# Patient Record
Sex: Male | Born: 1945 | Race: White | Hispanic: No | Marital: Married
Health system: Southern US, Community
[De-identification: ages and names within clinical notes are randomized; demographics above are authoritative.]

## PROBLEM LIST (undated history)

## (undated) DIAGNOSIS — I10 Essential (primary) hypertension: Secondary | ICD-10-CM

## (undated) DIAGNOSIS — E7800 Pure hypercholesterolemia, unspecified: Secondary | ICD-10-CM

## (undated) DIAGNOSIS — J342 Deviated nasal septum: Secondary | ICD-10-CM

## (undated) DIAGNOSIS — R42 Dizziness and giddiness: Secondary | ICD-10-CM

## (undated) DIAGNOSIS — G4730 Sleep apnea, unspecified: Secondary | ICD-10-CM

## (undated) DIAGNOSIS — J343 Hypertrophy of nasal turbinates: Secondary | ICD-10-CM

## (undated) DIAGNOSIS — M1990 Unspecified osteoarthritis, unspecified site: Secondary | ICD-10-CM

## (undated) DIAGNOSIS — R413 Other amnesia: Secondary | ICD-10-CM

## (undated) DIAGNOSIS — E860 Dehydration: Secondary | ICD-10-CM

## (undated) DIAGNOSIS — I2510 Atherosclerotic heart disease of native coronary artery without angina pectoris: Secondary | ICD-10-CM

## (undated) HISTORY — PX: EXCISION MORTON'S NEUROMA: SHX5013

## (undated) HISTORY — PX: CORONARY ARTERY BYPASS GRAFT: SHX141

## (undated) HISTORY — PX: CARDIAC SURGERY: SHX584

## (undated) HISTORY — PX: HERNIA REPAIR: SHX51

## (undated) HISTORY — DX: Dehydration: E86.0

## (undated) HISTORY — DX: Dizziness and giddiness: R42

## (undated) HISTORY — PX: OTHER SURGICAL HISTORY: SHX169

---

## 2001-08-03 ENCOUNTER — Ambulatory Visit (HOSPITAL_BASED_OUTPATIENT_CLINIC_OR_DEPARTMENT_OTHER): Admission: RE | Admit: 2001-08-03 | Discharge: 2001-08-03 | Payer: Self-pay | Admitting: Surgery

## 2004-08-09 ENCOUNTER — Emergency Department (HOSPITAL_COMMUNITY): Admission: AD | Admit: 2004-08-09 | Discharge: 2004-08-09 | Payer: Self-pay | Admitting: Family Medicine

## 2009-12-03 ENCOUNTER — Observation Stay (HOSPITAL_COMMUNITY): Admission: EM | Admit: 2009-12-03 | Discharge: 2009-12-03 | Payer: Self-pay | Admitting: Emergency Medicine

## 2010-10-06 LAB — BASIC METABOLIC PANEL
BUN: 13 mg/dL (ref 6–23)
Calcium: 8.3 mg/dL — ABNORMAL LOW (ref 8.4–10.5)
Chloride: 107 mEq/L (ref 96–112)
Creatinine, Ser: 1.06 mg/dL (ref 0.4–1.5)
GFR calc Af Amer: >60 mL/min (ref >60)
GFR calc non Af Amer: >60 mL/min (ref >60)
GFR calc non Af Amer: >60 mL/min (ref >60)
Glucose, Bld: 111 mg/dL — ABNORMAL HIGH (ref 70–99)
Potassium: 4 mEq/L (ref 3.5–5.1)

## 2010-10-06 LAB — CROSSMATCH
ABO/RH(D): O NEG
Antibody Screen: NEGATIVE

## 2010-10-06 LAB — APTT: aPTT: 25 seconds (ref 24–37)

## 2010-10-06 LAB — CBC
HCT: 40.8 % (ref 39.0–52.0)
MCHC: 33.4 g/dL (ref 30.0–36.0)
MCV: 93.1 fL (ref 78.0–100.0)
Platelets: 198 10*3/uL (ref 150–400)
Platelets: 298 10*3/uL (ref 150–400)
RBC: 3.75 MIL/uL — ABNORMAL LOW (ref 4.22–5.81)
RDW: 12.9 % (ref 11.5–15.5)
WBC: 11.4 10*3/uL — ABNORMAL HIGH (ref 4.0–10.5)

## 2010-10-06 LAB — DIFFERENTIAL
Basophils Absolute: 0 10*3/uL (ref 0.0–0.1)
Eosinophils Absolute: 0.1 10*3/uL (ref 0.0–0.7)
Eosinophils Relative: 1 % (ref 0–5)
Lymphocytes Relative: 21 % (ref 12–46)

## 2010-10-06 LAB — PROTIME-INR
INR: 1.26 (ref 0.00–1.49)
Prothrombin Time: 15.7 seconds — ABNORMAL HIGH (ref 11.6–15.2)

## 2010-10-06 LAB — URINALYSIS, ROUTINE W REFLEX MICROSCOPIC
Bilirubin Urine: NEGATIVE
Ketones, ur: NEGATIVE mg/dL
Nitrite: NEGATIVE
Protein, ur: NEGATIVE mg/dL
Urobilinogen, UA: 0.2 mg/dL (ref 0.0–1.0)

## 2010-12-05 NOTE — Op Note (Signed)
[place]  Patient:    Omar Kennedy, Omar Kennedy Visit Number: [id number] MRN: [id number]          Service Type: DSU Location: [facility] Attending Physician:  Charlton Haws Dictated by:   Currie Paris, M.D. Proc. Date: 08/03/01 Admit Date:  08/03/2001   CC:         Melvyn Neth D. Clovis Riley, M.D.   Operative Report  VISIT:  [id number].  OFFICE MEDICAL RECORD NUMBER:  [id number].  PREOPERATIVE DIAGNOSIS:  Left inguinal hernia.  POSTOPERATIVE DIAGNOSIS:  Left inguinal hernia - direct.  OPERATION:  Repair of direct left inguinal hernia with mesh.  SURGEON:  Currie Paris, M.D.  ANESTHESIA:  MAC.  CLINICAL HISTORY:  This patient is a 65 year old who had a left inguinal hernia and desired to have this fixed.  DESCRIPTION OF PROCEDURE:  The patient was seen in the holding area, and the operative site was identified and marked.  He was taken to the operating room and given IV sedation.  The groin area on the left side was shaved, prepped, and draped.  A combination of 1% Xylocaine with epinephrine plus 0.5% plain Marcaine was mixed equally and used for local.  I infiltrated along the incision line and then below the fascia at the anterior-superior iliac spine.  The skin incision was made and deepened to the external oblique aponeurosis with bleeders either coagulated or tied with 4-0 Vicryl.  Additional local was infiltrated as the deeper layers were encountered.  The external oblique was opened in the line of its fibers.  Retractors were placed.  The cord was dissected off the floor and surrounded with a Penrose drain.  The ilioinguinal nerve was kept with that, and the iliohypogastric nerve identified and preserved.  This was a large direct defect with some fatty tissue protruding through, but no indirect sac noted.  The direct material was cleaned off the posterior aspect of the cord, free up nicely, and reduced easily.  I used a large  mesh plug to occlude the defect and then used some 2-0 Prolenes to hold this in place and to somewhat reapproximate the transversalis.  I then placed a mesh patch on top and sutured it in with a running suture inferiorly, tacked it medially, and let it go well lateral past the cord using the split to make a new deep ring.  I tacked the two tails of the mesh together laterally to reconstruct the deep ring.  Everything appeared to be dry.  There was no tension, and the cord appeared to have plenty of room to come through.  The external oblique was closed with a running 3-0 Vicryl, Scarpas with running 3-0 Vicryl, and the skin with a 4-0 Monocryl subcuticular running.  The patient tolerated the procedure well, and there were no operative complications.  All counts are correct. Dictated by:   Currie Paris, M.D. Attending Physician:  Charlton Haws DD:  08/03/01 TD:  08/03/01 Job: [id number]

## 2011-05-20 DIAGNOSIS — G4700 Insomnia, unspecified: Secondary | ICD-10-CM | POA: Insufficient documentation

## 2011-05-20 DIAGNOSIS — N400 Enlarged prostate without lower urinary tract symptoms: Secondary | ICD-10-CM | POA: Insufficient documentation

## 2015-08-05 ENCOUNTER — Other Ambulatory Visit: Payer: Self-pay | Admitting: Family Medicine

## 2015-08-05 DIAGNOSIS — Z8709 Personal history of other diseases of the respiratory system: Secondary | ICD-10-CM

## 2015-08-07 ENCOUNTER — Other Ambulatory Visit: Payer: Self-pay

## 2015-12-10 DIAGNOSIS — E7800 Pure hypercholesterolemia, unspecified: Secondary | ICD-10-CM

## 2015-12-10 DIAGNOSIS — R030 Elevated blood-pressure reading, without diagnosis of hypertension: Secondary | ICD-10-CM | POA: Insufficient documentation

## 2015-12-10 DIAGNOSIS — Z87891 Personal history of nicotine dependence: Secondary | ICD-10-CM | POA: Insufficient documentation

## 2015-12-10 DIAGNOSIS — I10 Essential (primary) hypertension: Secondary | ICD-10-CM | POA: Insufficient documentation

## 2015-12-10 DIAGNOSIS — J339 Nasal polyp, unspecified: Secondary | ICD-10-CM | POA: Insufficient documentation

## 2015-12-10 DIAGNOSIS — R413 Other amnesia: Secondary | ICD-10-CM | POA: Insufficient documentation

## 2015-12-10 DIAGNOSIS — G5762 Lesion of plantar nerve, left lower limb: Secondary | ICD-10-CM | POA: Insufficient documentation

## 2015-12-10 HISTORY — DX: Lesion of plantar nerve, left lower limb: G57.62

## 2015-12-10 HISTORY — DX: Pure hypercholesterolemia, unspecified: E78.00

## 2016-07-16 ENCOUNTER — Other Ambulatory Visit (INDEPENDENT_AMBULATORY_CARE_PROVIDER_SITE_OTHER): Payer: Self-pay | Admitting: Otolaryngology

## 2016-07-16 DIAGNOSIS — J329 Chronic sinusitis, unspecified: Secondary | ICD-10-CM

## 2016-07-17 ENCOUNTER — Ambulatory Visit
Admission: RE | Admit: 2016-07-17 | Discharge: 2016-07-17 | Disposition: A | Discharge status: Home / Self Care | Payer: Medicare Other | Source: Ambulatory Visit | Attending: Otolaryngology | Admitting: Otolaryngology

## 2016-07-17 DIAGNOSIS — J329 Chronic sinusitis, unspecified: Secondary | ICD-10-CM

## 2016-07-20 HISTORY — PX: CORONARY ARTERY BYPASS GRAFT: SHX141

## 2016-12-28 ENCOUNTER — Other Ambulatory Visit: Payer: Self-pay | Admitting: Family Medicine

## 2016-12-28 DIAGNOSIS — R634 Abnormal weight loss: Secondary | ICD-10-CM

## 2017-05-03 ENCOUNTER — Ambulatory Visit (INDEPENDENT_AMBULATORY_CARE_PROVIDER_SITE_OTHER): Payer: Medicare Other

## 2017-05-03 ENCOUNTER — Ambulatory Visit (INDEPENDENT_AMBULATORY_CARE_PROVIDER_SITE_OTHER): Payer: Medicare Other | Admitting: Podiatry

## 2017-05-03 ENCOUNTER — Encounter: Payer: Self-pay | Admitting: Podiatry

## 2017-05-03 VITALS — BP 163/80 | HR 64 | Resp 16

## 2017-05-03 DIAGNOSIS — M2042 Other hammer toe(s) (acquired), left foot: Secondary | ICD-10-CM

## 2017-05-03 DIAGNOSIS — M21619 Bunion of unspecified foot: Secondary | ICD-10-CM

## 2017-05-03 DIAGNOSIS — Q828 Other specified congenital malformations of skin: Secondary | ICD-10-CM

## 2017-05-03 NOTE — Progress Notes (Signed)
   Subjective:    Patient ID: Omar Kennedy, male    DOB: 16-Jul-1946, 71 y.o.   MRN: [id number]  HPI 71 year old male presents the office today for concerns of painful lesion to the bottom of his left foot which is been ongoing for about 1 year and is painful with pressure in shoes. He also states he has bunions and hammertoes on his left foot he was discussed although they are not actually causing any pain. He previously has seen other doctors for this he recommended surgery but he does not want to proceed with this. He denies stepping on any foreign objects. Denies any redness or swelling or any drainage. He's had no other treatments. He has no other complaints today.   Review of Systems  All other systems reviewed and are negative.  History reviewed. No pertinent past medical history.  History reviewed. No pertinent surgical history.   Current Outpatient Prescriptions:  .  fluticasone (FLONASE) 50 MCG/ACT nasal spray, Place 2 sprays into both nostrils daily., Disp: , Rfl: 11  Allergies  Allergen Reactions  . Tetracycline Other (See Comments)    VERTIGO  . Amoxicillin-Pot Clavulanate Nausea Only    Social History   Social History  . Marital status: Single    Spouse name: N/A  . Number of children: N/A  . Years of education: N/A   Occupational History  . Not on file.   Social History Main Topics  . Smoking status: Unknown If Ever Smoked  . Smokeless tobacco: Never Used  . Alcohol use Not on file  . Drug use: Unknown  . Sexual activity: Not on file   Other Topics Concern  . Not on file   Social History Narrative  . No narrative on file         Objective:   Physical Exam General: AAO x3, NAD  Dermatological: There is an annular, deep, hyperkeratotic lesion present submetatarsal 3 on the left foot. Upon debridement there is no underlying ulceration, drainage or any signs of infection. There are no other open lesions or pre-ulcerative lesion idenified at this  time.  Vascular: Dorsalis Pedis artery and Posterior Tibial artery pedal pulses are 2/4 bilateral with immedate capillary fill time.No varicosities and no lower extremity edema present bilateral. There is no pain with calf compression, swelling, warmth, erythema.   Neruologic: Grossly intact via light touch bilateral. Protective threshold with Semmes Wienstein monofilament intact to all pedal sites bilateral.   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 His DIPJ contracture of the second toe at PIPJ contracture of the third digit. There is no tenderness along his digital deformities. There is no other areas of tenderness other than the hyperkeratotic lesion left submetatarsal . MMT 5/5. Range of motion intact otherwise.  Gait: Unassisted, Nonantalgic.      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 HAV and digital deformities are present. There is no evidence of acute fracture, foreign body. -Hyperkeratotic lesion was sharply debrided 1 without any complications or bleeding. Discussed with him likely recurrence. Discussed offloading pads multiple offloading pads were dispensed. Also discussed an accommodative insert for his shoes.   Ovid Curd, DPM

## 2017-05-21 ENCOUNTER — Encounter: Payer: Self-pay | Admitting: Neurology

## 2017-05-25 ENCOUNTER — Institutional Professional Consult (permissible substitution): Payer: Medicare Other | Admitting: Neurology

## 2017-05-27 DIAGNOSIS — I2510 Atherosclerotic heart disease of native coronary artery without angina pectoris: Secondary | ICD-10-CM

## 2017-05-27 HISTORY — DX: Atherosclerotic heart disease of native coronary artery without angina pectoris: I25.10

## 2017-07-01 DIAGNOSIS — Z951 Presence of aortocoronary bypass graft: Secondary | ICD-10-CM | POA: Insufficient documentation

## 2017-07-01 HISTORY — DX: Presence of aortocoronary bypass graft: Z95.1

## 2017-07-05 ENCOUNTER — Telehealth (HOSPITAL_COMMUNITY): Payer: Self-pay

## 2017-07-05 NOTE — Telephone Encounter (Signed)
Patients insurance is active and benefits verified through Medicare Part A & B - No co-pay, deductible amount of $183.00/$183.00 has been met, no out of pocket, 20% co-insurance, and no pre-authorization is required. Passport/reference [phone]  Patients insurance is active and benefits verified through [place] - No co-pay, deductible amount of $1,080/$1,080 has been met, out of pocket amount of $4,388/$47.44 has been met, no co-insurance, and no pre-authorization is required. Passport/reference [phone]

## 2017-07-05 NOTE — Telephone Encounter (Signed)
Created referral. Call patient in regards to Cardiac Rehab - [place] lives in [place]. Asked if he would prefer to go to [facility] or [facility]. He stated [facility]. Insurance benefits and eligibility to be determined.

## 2017-07-06 ENCOUNTER — Telehealth (HOSPITAL_COMMUNITY): Payer: Self-pay

## 2017-07-06 NOTE — Telephone Encounter (Signed)
Faxed over Physician order to Dr.Haney, also requested recent EKG.

## 2017-08-04 ENCOUNTER — Telehealth (HOSPITAL_COMMUNITY): Payer: Self-pay

## 2017-08-04 NOTE — Telephone Encounter (Signed)
Diane from [facility] called to stated that patient will be participating in Card[facility] Rehab Program at [facility] due to distance. Called to verify with patient and he stated that he could get in [facility] with [facility]. Closed referral.

## 2017-08-12 ENCOUNTER — Encounter: Payer: Medicare Other | Attending: Internal Medicine | Admitting: *Deleted

## 2017-08-12 ENCOUNTER — Encounter: Payer: Self-pay | Admitting: *Deleted

## 2017-08-12 VITALS — Ht 70.75 in | Wt 171.8 lb

## 2017-08-12 DIAGNOSIS — Z951 Presence of aortocoronary bypass graft: Secondary | ICD-10-CM

## 2017-08-12 DIAGNOSIS — I10 Essential (primary) hypertension: Secondary | ICD-10-CM

## 2017-08-12 HISTORY — DX: Essential (primary) hypertension: I10

## 2017-08-12 NOTE — Progress Notes (Signed)
Daily Session Note  Patient Details  Name: Omar Kennedy MRN: [id number] Date of Birth: 29-Jan-1946 Referring Provider:     Cardiac Rehab from 08/12/2017 in [facility]  Referring Provider  [place]      Encounter Date: 08/12/2017  Check In: Session Check In - 08/12/17 1146      Check-In   Location  [facility]-Cardiac & Pulmonary Rehab    Staff Present  Nada Maclachlan, BA, ACSM CEP, Exercise Physiologist;Krista Frederico Hamman, RN BSN;Gabriele Loveland Sherryll Burger, RN BSN    Supervising physician immediately available to respond to emergencies  See telemetry face sheet for immediately available ER MD    Medication changes reported      No    Fall or balance concerns reported     No    Tobacco Cessation  No Change    Warm-up and Cool-down  Performed as group-led instruction    Resistance Training Performed  Yes    VAD Patient?  No      Pain Assessment   Currently in Pain?  No/denies    Multiple Pain Sites  No        Exercise Prescription Changes - 08/12/17 1400      Response to Exercise   Blood Pressure (Admit)  132/60    Blood Pressure (Exercise)  172/72    Blood Pressure (Exit)  136/66    Heart Rate (Admit)  62 bpm    Heart Rate (Exercise)  117 bpm    Heart Rate (Exit)  61 bpm    Oxygen Saturation (Admit)  100 %    Oxygen Saturation (Exit)  99 %    Rating of Perceived Exertion (Exercise)  8       Social History   Tobacco Use  Smoking Status Former Smoker  . Last attempt to quit: 1980  . Years since quitting: 39.0  Smokeless Tobacco Never Used    Goals Met:  Proper associated with RPD/PD & O2 Sat Exercise tolerated well Personal goals reviewed No report of cardiac concerns or symptoms Strength training completed today  Goals Unmet:  Not Applicable  Comments: Med Review completed   Dr. Emily Filbert is Medical Director for [employer] and [facility].

## 2017-08-12 NOTE — Progress Notes (Signed)
Cardiac Individual Treatment Plan  Patient Details  Name: Omar Kennedy MRN: [id number] Date of Birth: 08-Feb-1946 Referring Provider:     Cardiac Rehab from 08/12/2017 in [facility] Cardiac and Pulmonary Rehab  Referring Provider  Henke      Initial Encounter Date:    Cardiac Rehab from 08/12/2017 in [facility] Cardiac and Pulmonary Rehab  Date  08/12/17  Referring Provider  Henke      Visit Diagnosis: S/P CABG x 4  Patient's Home Medications on Admission:  Current Outpatient Medications:  .  acetaminophen (TYLENOL) 325 MG tablet, Take by mouth., Disp: , Rfl:  .  aspirin EC 81 MG tablet, Take by mouth., Disp: , Rfl:  .  carvedilol (COREG) 12.5 MG tablet, Take 12.5 mg by mouth 2 (two) times daily with a meal., Disp: , Rfl: 11 .  fluticasone (FLONASE) 50 MCG/ACT nasal spray, Place 2 sprays into both nostrils daily., Disp: , Rfl: 11 .  HYDROmorphone (DILAUDID) 2 MG tablet, Take 2 mg by mouth every 6 (six) hours as needed. for pain, Disp: , Rfl: 0 .  KLOR-CON 10 10 MEQ tablet, Take 10 mEq by mouth daily., Disp: , Rfl: 0 .  lisinopril (PRINIVIL,ZESTRIL) 5 MG tablet, Take 5 mg by mouth daily., Disp: , Rfl: 3 .  metoprolol tartrate (LOPRESSOR) 25 MG tablet, Take 25 mg by mouth every 12 (twelve) hours., Disp: , Rfl: 2 .  nitroGLYCERIN (NITROSTAT) 0.4 MG SL tablet, PLEASE SEE ATTACHED FOR DETAILED DIRECTIONS, Disp: , Rfl: 0 .  rosuvastatin (CRESTOR) 40 MG tablet, TAKE ONE TABLET BY MOUTH AT NIGHT, Disp: , Rfl: 2 .  senna-docusate (SENOKOT-S) 8.6-50 MG tablet, Take by mouth., Disp: , Rfl:  .  sulfamethoxazole-trimethoprim (BACTRIM,SEPTRA) 400-80 MG tablet, TAKE 1 TABLET (80 MG OF TRIMETHOPRIM TOTAL) BY MOUTH 2 (TWO) TIMES DAILY, Disp: , Rfl: 0 .  furosemide (LASIX) 20 MG tablet, Take 20 mg by mouth daily., Disp: , Rfl: 0  Past Medical History: History reviewed. No pertinent past medical history.  Tobacco Use: Social History   Tobacco Use  Smoking Status Former Smoker  . Last attempt to quit:  1980  . Years since quitting: 39.0  Smokeless Tobacco Never Used    Labs: Recent Review Flowsheet Data    There is no flowsheet data to display.       Exercise Target Goals: Date: 08/12/17  Exercise Program Goal: Individual exercise prescription set using results from initial 6 min walk test and THRR while considering  patient's activity barriers and safety.   Exercise Prescription Goal: Initial exercise prescription builds to 30-45 minutes a day of aerobic activity, 2-3 days per week.  Home exercise guidelines will be given to patient during program as part of exercise prescription that the participant will acknowledge.  Activity Barriers & Risk Stratification: Activity Barriers & Cardiac Risk Stratification - 08/12/17 1415      Activity Barriers & Cardiac Risk Stratification   Activity Barriers  Incisional Pain    Cardiac Risk Stratification  Moderate       6 Minute Walk: 6 Minute Walk    Row Name 08/12/17 1407         6 Minute Walk   Distance  1500 feet     Walk Time  6 minutes     # of Rest Breaks  0     MPH  2.84     METS  3.99     RPE  8     Perceived Dyspnea   0  VO2 Peak  13.97     Symptoms  No     Resting HR  62 bpm     Resting BP  132/60     Resting Oxygen Saturation   100 %     Exercise Oxygen Saturation  during 6 min walk  99 %     Max Ex. HR  117 bpm     Max Ex. BP  172/72     2 Minute Post BP  136/66        Oxygen Initial Assessment:   Oxygen Re-Evaluation:   Oxygen Discharge (Final Oxygen Re-Evaluation):   Initial Exercise Prescription: Initial Exercise Prescription - 08/12/17 1400      Date of Initial Exercise RX and Referring Provider   Date  08/12/17    Referring Provider  Henke      Treadmill   MPH  2.8    Grade  2    Minutes  15    METs  4      Recumbant Bike   Level  4    RPM  60    Watts  50    Minutes  15    METs  4      T5 Nustep   Level  3    SPM  80    Minutes  15    METs  4      Prescription  Details   Frequency (times per week)  3    Duration  Progress to 45 minutes of aerobic exercise without signs/symptoms of physical distress      Intensity   THRR 40-80% of Max Heartrate  96-131    Ratings of Perceived Exertion  11-13    Perceived Dyspnea  0-4      Resistance Training   Training Prescription  Yes    Weight  4 lb    Reps  10-15       Perform Capillary Blood Glucose checks as needed.  Exercise Prescription Changes: Exercise Prescription Changes    Row Name 08/12/17 1400             Response to Exercise   Blood Pressure (Admit)  132/60       Blood Pressure (Exercise)  172/72       Blood Pressure (Exit)  136/66       Heart Rate (Admit)  62 bpm       Heart Rate (Exercise)  117 bpm       Heart Rate (Exit)  61 bpm       Oxygen Saturation (Admit)  100 %       Oxygen Saturation (Exit)  99 %       Rating of Perceived Exertion (Exercise)  8          Exercise Comments:   Exercise Goals and Review: Exercise Goals    Row Name 08/12/17 1406             Exercise Goals   Increase Physical Activity  Yes       Intervention  Provide advice, education, support and counseling about physical activity/exercise needs.;Develop an individualized exercise prescription for aerobic and resistive training based on initial evaluation findings, risk stratification, comorbidities and participant's personal goals.       Expected Outcomes  Short Term: Attend rehab on a regular basis to increase amount of physical activity.;Long Term: Add in home exercise to make exercise part of routine and to increase amount of physical activity.;Long Term: Exercising regularly at  least 3-5 days a week.       Increase Strength and Stamina  Yes       Intervention  Provide advice, education, support and counseling about physical activity/exercise needs.;Develop an individualized exercise prescription for aerobic and resistive training based on initial evaluation findings, risk stratification,  comorbidities and participant's personal goals.       Expected Outcomes  Short Term: Increase workloads from initial exercise prescription for resistance, speed, and METs.;Short Term: Perform resistance training exercises routinely during rehab and add in resistance training at home;Long Term: Improve cardiorespiratory fitness, muscular endurance and strength as measured by increased METs and functional capacity (6MWT)       Able to understand and use rate of perceived exertion (RPE) scale  Yes       Intervention  Provide education and explanation on how to use RPE scale       Expected Outcomes  Short Term: Able to use RPE daily in rehab to express subjective intensity level;Long Term:  Able to use RPE to guide intensity level when exercising independently       Able to understand and use Dyspnea scale  Yes       Intervention  Provide education and explanation on how to use Dyspnea scale       Expected Outcomes  Short Term: Able to use Dyspnea scale daily in rehab to express subjective sense of shortness of breath during exertion;Long Term: Able to use Dyspnea scale to guide intensity level when exercising independently       Knowledge and understanding of Target Heart Rate Range (THRR)  Yes       Intervention  Provide education and explanation of THRR including how the numbers were predicted and where they are located for reference       Expected Outcomes  Short Term: Able to state/look up THRR;Long Term: Able to use THRR to govern intensity when exercising independently;Short Term: Able to use daily as guideline for intensity in rehab       Able to check pulse independently  Yes       Intervention  Provide education and demonstration on how to check pulse in carotid and radial arteries.;Review the importance of being able to check your own pulse for safety during independent exercise       Expected Outcomes  Short Term: Able to explain why pulse checking is important during independent exercise;Long  Term: Able to check pulse independently and accurately       Understanding of Exercise Prescription  Yes       Intervention  Provide education, explanation, and written materials on patient's individual exercise prescription       Expected Outcomes  Short Term: Able to explain program exercise prescription;Long Term: Able to explain home exercise prescription to exercise independently          Exercise Goals Re-Evaluation :   Discharge Exercise Prescription (Final Exercise Prescription Changes): Exercise Prescription Changes - 08/12/17 1400      Response to Exercise   Blood Pressure (Admit)  132/60    Blood Pressure (Exercise)  172/72    Blood Pressure (Exit)  136/66    Heart Rate (Admit)  62 bpm    Heart Rate (Exercise)  117 bpm    Heart Rate (Exit)  61 bpm    Oxygen Saturation (Admit)  100 %    Oxygen Saturation (Exit)  99 %    Rating of Perceived Exertion (Exercise)  8  Nutrition:  Target Goals: Understanding of nutrition guidelines, daily intake of sodium 1500mg , cholesterol 200mg , calories 30% from fat and 7% or less from saturated fats, daily to have 5 or more servings of fruits and vegetables.  Biometrics: Pre Biometrics - 08/12/17 1405      Pre Biometrics   Height  5' 10.75" (1.797 m)    Weight  171 lb 12.8 oz (77.9 kg)    Waist Circumference  36 inches    Hip Circumference  40 inches    Waist to Hip Ratio  0.9 %    BMI (Calculated)  24.13    Single Leg Stand  10.5 seconds        Nutrition Therapy Plan and Nutrition Goals: Nutrition Therapy & Goals - 08/12/17 1412      Intervention Plan   Intervention  Prescribe, educate and counsel regarding individualized specific dietary modifications aiming towards targeted core components such as weight, hypertension, lipid management, diabetes, heart failure and other comorbidities.;Nutrition handout(s) given to patient.    Expected Outcomes  Short Term Goal: Understand basic principles of dietary content, such as  calories, fat, sodium, cholesterol and nutrients.;Short Term Goal: A plan has been developed with personal nutrition goals set during dietitian appointment.;Long Term Goal: Adherence to prescribed nutrition plan.       Nutrition Assessments: Nutrition Assessments - 08/12/17 1149      MEDFICTS Scores   Pre Score  32       Nutrition Goals Re-Evaluation:   Nutrition Goals Discharge (Final Nutrition Goals Re-Evaluation):   Psychosocial: Target Goals: Acknowledge presence or absence of significant depression and/or stress, maximize coping skills, provide positive support system. Participant is able to verbalize types and ability to use techniques and skills needed for reducing stress and depression.   Initial Review & Psychosocial Screening: Initial Psych Review & Screening - 08/12/17 1412      Initial Review   Current issues with  Current Sleep Concerns      Family Dynamics   Good Support System?  Yes      Screening Interventions   Interventions  Encouraged to exercise;Program counselor consult;To provide support and resources with identified psychosocial needs    Expected Outcomes  Short Term goal: Utilizing psychosocial counselor, staff and physician to assist with identification of specific Stressors or current issues interfering with healing process. Setting desired goal for each stressor or current issue identified.;Long Term Goal: Stressors or current issues are controlled or eliminated.;Short Term goal: Identification and review with participant of any Quality of Life or Depression concerns found by scoring the questionnaire.;Long Term goal: The participant improves quality of Life and PHQ9 Scores as seen by post scores and/or verbalization of changes       Quality of Life Scores:  Quality of Life - 08/12/17 1148      Quality of Life Scores   Health/Function Pre  21 %    Socioeconomic Pre  20.38 %    Psych/Spiritual Pre  20.71 %    Family Pre  21 %    GLOBAL Pre  20.8 %       Scores of 19 and below usually indicate a poorer quality of life in these areas.  A difference of  2-3 points is a clinically meaningful difference.  A difference of 2-3 points in the total score of the Quality of Life Index has been associated with significant improvement in overall quality of life, self-image, physical symptoms, and general health in studies assessing change in quality of life.  PHQ-9: Recent Review Flowsheet Data    Depression screen [facility] 2/9 08/12/2017   Decreased Interest 0   Down, Depressed, Hopeless 1   PHQ - 2 Score 1   Altered sleeping 0   Tired, decreased energy 1   Change in appetite 0   Feeling bad or failure about yourself  0   Trouble concentrating 0   Moving slowly or fidgety/restless 0   Suicidal thoughts 0   PHQ-9 Score 2   Difficult doing work/chores Somewhat difficult     Interpretation of Total Score  Total Score Depression Severity:  1-4 = Minimal depression, 5-9 = Mild depression, 10-14 = Moderate depression, 15-19 = Moderately severe depression, 20-27 = Severe depression   Psychosocial Evaluation and Intervention:   Psychosocial Re-Evaluation:   Psychosocial Discharge (Final Psychosocial Re-Evaluation):   Vocational Rehabilitation: Provide vocational rehab assistance to qualifying candidates.   Vocational Rehab Evaluation & Intervention: Vocational Rehab - 08/12/17 1413      Initial Vocational Rehab Evaluation & Intervention   Assessment shows need for Vocational Rehabilitation  No       Education: Education Goals: Education classes will be provided on a variety of topics geared toward better understanding of heart health and risk factor modification. Participant will state understanding/return demonstration of topics presented as noted by education test scores.  Learning Barriers/Preferences: Learning Barriers/Preferences - 08/12/17 1413      Learning Barriers/Preferences   Learning Barriers  None    Learning  Preferences  None       Education Topics: General Nutrition Guidelines/Fats and Fiber: -Group instruction provided by verbal, written material, models and posters to present the general guidelines for heart healthy nutrition. Gives an explanation and review of dietary fats and fiber.   Controlling Sodium/Reading Food Labels: -Group verbal and written material supporting the discussion of sodium use in heart healthy nutrition. Review and explanation with models, verbal and written materials for utilization of the food label.   Exercise Physiology & Risk Factors: - Group verbal and written instruction with models to review the exercise physiology of the cardiovascular system and associated critical values. Details cardiovascular disease risk factors and the goals associated with each risk factor.   Aerobic Exercise & Resistance Training: - Gives group verbal and written discussion on the health impact of inactivity. On the components of aerobic and resistive training programs and the benefits of this training and how to safely progress through these programs.   Flexibility, Balance, General Exercise Guidelines: - Provides group verbal and written instruction on the benefits of flexibility and balance training programs. Provides general exercise guidelines with specific guidelines to those with heart or lung disease. Demonstration and skill practice provided.   Stress Management: - Provides group verbal and written instruction about the health risks of elevated stress, cause of high stress, and healthy ways to reduce stress.   Depression: - Provides group verbal and written instruction on the correlation between heart/lung disease and depressed mood, treatment options, and the stigmas associated with seeking treatment.   Anatomy & Physiology of the Heart: - Group verbal and written instruction and models provide basic cardiac anatomy and physiology, with the coronary electrical and  arterial systems. Review of: AMI, Angina, Valve disease, Heart Failure, Cardiac Arrhythmia, Pacemakers, and the ICD.   Cardiac Procedures: - Group verbal and written instruction to review commonly prescribed medications for heart disease. Reviews the medication, class of the drug, and side effects. Includes the steps to properly store meds and maintain the prescription regimen. (  beta blockers and nitrates)   Cardiac Medications I: - Group verbal and written instruction to review commonly prescribed medications for heart disease. Reviews the medication, class of the drug, and side effects. Includes the steps to properly store meds and maintain the prescription regimen.   Cardiac Medications II: -Group verbal and written instruction to review commonly prescribed medications for heart disease. Reviews the medication, class of the drug, and side effects. (all other drug classes)    Go Sex-Intimacy & Heart Disease, Get SMART - Goal Setting: - Group verbal and written instruction through game format to discuss heart disease and the return to sexual intimacy. Provides group verbal and written material to discuss and apply goal setting through the application of the S.M.A.R.T. Method.   Other Matters of the Heart: - Provides group verbal, written materials and models to describe Heart Failure, Angina, Valve Disease, Peripheral Artery Disease, and Diabetes in the realm of heart disease. Includes description of the disease process and treatment options available to the cardiac patient.   Exercise & Equipment Safety: - Individual verbal instruction and demonstration of equipment use and safety with use of the equipment.   Cardiac Rehab from 08/12/2017 in [facility] Cardiac and Pulmonary Rehab  Date  08/12/17  Educator  [facility]  Instruction Review Code  1- Verbalizes Understanding      Infection Prevention: - Provides verbal and written material to individual with discussion of infection control including  proper hand washing and proper equipment cleaning during exercise session.   Cardiac Rehab from 08/12/2017 in [facility] Cardiac and Pulmonary Rehab  Date  08/12/17  Educator  [facility]  Instruction Review Code  1- Verbalizes Understanding      Falls Prevention: - Provides verbal and written material to individual with discussion of falls prevention and safety.   Cardiac Rehab from 08/12/2017 in [facility] Cardiac and Pulmonary Rehab  Date  08/12/17  Educator  [facility]  Instruction Review Code  1- Verbalizes Understanding      Diabetes: - Individual verbal and written instruction to review signs/symptoms of diabetes, desired ranges of glucose level fasting, after meals and with exercise. Acknowledge that pre and post exercise glucose checks will be done for 3 sessions at entry of program.   Other: -Provides group and verbal instruction on various topics (see comments)    Knowledge Questionnaire Score: Knowledge Questionnaire Score - 08/12/17 1148      Knowledge Questionnaire Score   Pre Score  26/28 correct answers reviewed with Mubashir       Core Components/Risk Factors/Patient Goals at Admission: Personal Goals and Risk Factors at Admission - 08/12/17 1410      Core Components/Risk Factors/Patient Goals on Admission    Weight Management  Weight Maintenance;Yes    Intervention  Weight Management: Develop a combined nutrition and exercise program designed to reach desired caloric intake, while maintaining appropriate intake of nutrient and fiber, sodium and fats, and appropriate energy expenditure required for the weight goal.;Weight Management: Provide education and appropriate resources to help participant work on and attain dietary goals.;Weight Management/Obesity: Establish reasonable short term and long term weight goals.    Admit Weight  172 lb (78 kg)    Expected Outcomes  Short Term: Continue to assess and modify interventions until short term weight is achieved;Long Term: Adherence to nutrition  and physical activity/exercise program aimed toward attainment of established weight goal;Weight Maintenance: Understanding of the daily nutrition guidelines, which includes 25-35% calories from fat, 7% or less cal from saturated fats, less than 200mg   cholesterol, less than 1.5gm of sodium, & 5 or more servings of fruits and vegetables daily;Understanding recommendations for meals to include 15-35% energy as protein, 25-35% energy from fat, 35-60% energy from carbohydrates, less than 200mg  of dietary cholesterol, 20-35 gm of total fiber daily;Understanding of distribution of calorie intake throughout the day with the consumption of 4-5 meals/snacks    Hypertension  Yes    Intervention  Provide education on lifestyle modifcations including regular physical activity/exercise, weight management, moderate sodium restriction and increased consumption of fresh fruit, vegetables, and low fat dairy, alcohol moderation, and smoking cessation.;Monitor prescription use compliance.    Expected Outcomes  Short Term: Continued assessment and intervention until BP is < 140/75mm HG in hypertensive participants. < 130/66mm HG in hypertensive participants with diabetes, heart failure or chronic kidney disease.;Long Term: Maintenance of blood pressure at goal levels.    Lipids  Yes    Intervention  Provide education and support for participant on nutrition & aerobic/resistive exercise along with prescribed medications to achieve LDL 70mg , HDL >40mg .    Expected Outcomes  Short Term: Participant states understanding of desired cholesterol values and is compliant with medications prescribed. Participant is following exercise prescription and nutrition guidelines.;Long Term: Cholesterol controlled with medications as prescribed, with individualized exercise RX and with personalized nutrition plan. Value goals: LDL < 70mg , HDL > 40 mg.       Core Components/Risk Factors/Patient Goals Review:    Core Components/Risk  Factors/Patient Goals at Discharge (Final Review):    ITP Comments: ITP Comments    Row Name 08/12/17 1404           ITP Comments  Med Review completed. Initial ITP created. Diagnosis can be found Care Everywhere 08/02/17          Comments: Initial ITP

## 2017-08-12 NOTE — Patient Instructions (Signed)
Patient Instructions  Patient Details  Name: Omar Kennedy MRN: [id number] Date of Birth: [birth year] Referring Provider:  Vita Erm, MD  Below are your personal goals for exercise, nutrition, and risk factors. Our goal is to help you stay on track towards obtaining and maintaining these goals. We will be discussing your progress on these goals with you throughout the program.  Initial Exercise Prescription: Initial Exercise Prescription - 08/12/17 1400      Date of Initial Exercise RX and Referring Provider   Date  08/12/17    Referring Provider  Henke      Treadmill   MPH  2.8    Grade  2    Minutes  15    METs  4      Recumbant Bike   Level  4    RPM  60    Watts  50    Minutes  15    METs  4      T5 Nustep   Level  3    SPM  80    Minutes  15    METs  4      Prescription Details   Frequency (times per week)  3    Duration  Progress to 45 minutes of aerobic exercise without signs/symptoms of physical distress      Intensity   THRR 40-80% of Max Heartrate  96-131    Ratings of Perceived Exertion  11-13    Perceived Dyspnea  0-4      Resistance Training   Training Prescription  Yes    Weight  4 lb    Reps  10-15       Exercise Goals: Frequency: Be able to perform aerobic exercise two to three times per week in program working toward 2-5 days per week of home exercise.  Intensity: Work with a perceived exertion of 11 (fairly light) - 15 (hard) while following your exercise prescription.  We will make changes to your prescription with you as you progress through the program.   Duration: Be able to do 30 to 45 minutes of continuous aerobic exercise in addition to a 5 minute warm-up and a 5 minute cool-down routine.   Nutrition Goals: Your personal nutrition goals will be established when you do your nutrition analysis with the dietician.  The following are general nutrition guidelines to follow: Cholesterol < 200mg /day Sodium < 1500mg /day Fiber: Men  over 50 yrs - 30 grams per day  Personal Goals: Personal Goals and Risk Factors at Admission - 08/12/17 1410      Core Components/Risk Factors/Patient Goals on Admission    Weight Management  Weight Maintenance;Yes    Intervention  Weight Management: Develop a combined nutrition and exercise program designed to reach desired caloric intake, while maintaining appropriate intake of nutrient and fiber, sodium and fats, and appropriate energy expenditure required for the weight goal.;Weight Management: Provide education and appropriate resources to help participant work on and attain dietary goals.;Weight Management/Obesity: Establish reasonable short term and long term weight goals.    Admit Weight  172 lb (78 kg)    Expected Outcomes  Short Term: Continue to assess and modify interventions until short term weight is achieved;Long Term: Adherence to nutrition and physical activity/exercise program aimed toward attainment of established weight goal;Weight Maintenance: Understanding of the daily nutrition guidelines, which includes 25-35% calories from fat, 7% or less cal from saturated fats, less than 200mg  cholesterol, less than 1.5gm of sodium, & 5 or more servings of  fruits and vegetables daily;Understanding recommendations for meals to include 15-35% energy as protein, 25-35% energy from fat, 35-60% energy from carbohydrates, less than 200mg  of dietary cholesterol, 20-35 gm of total fiber daily;Understanding of distribution of calorie intake throughout the day with the consumption of 4-5 meals/snacks    Hypertension  Yes    Intervention  Provide education on lifestyle modifcations including regular physical activity/exercise, weight management, moderate sodium restriction and increased consumption of fresh fruit, vegetables, and low fat dairy, alcohol moderation, and smoking cessation.;Monitor prescription use compliance.    Expected Outcomes  Short Term: Continued assessment and intervention until BP is  < 140/3690mm HG in hypertensive participants. < 130/3180mm HG in hypertensive participants with diabetes, heart failure or chronic kidney disease.;Long Term: Maintenance of blood pressure at goal levels.    Lipids  Yes    Intervention  Provide education and support for participant on nutrition & aerobic/resistive exercise along with prescribed medications to achieve LDL 70mg , HDL >40mg .    Expected Outcomes  Short Term: Participant states understanding of desired cholesterol values and is compliant with medications prescribed. Participant is following exercise prescription and nutrition guidelines.;Long Term: Cholesterol controlled with medications as prescribed, with individualized exercise RX and with personalized nutrition plan. Value goals: LDL < 70mg , HDL > 40 mg.       Tobacco Use Initial Evaluation: Social History   Tobacco Use  Smoking Status Former Smoker  . Last attempt to quit: 1980  . Years since quitting: 39.0  Smokeless Tobacco Never Used    Exercise Goals and Review: Exercise Goals    Row Name 08/12/17 1406             Exercise Goals   Increase Physical Activity  Yes       Intervention  Provide advice, education, support and counseling about physical activity/exercise needs.;Develop an individualized exercise prescription for aerobic and resistive training based on initial evaluation findings, risk stratification, comorbidities and participant's personal goals.       Expected Outcomes  Short Term: Attend rehab on a regular basis to increase amount of physical activity.;Long Term: Add in home exercise to make exercise part of routine and to increase amount of physical activity.;Long Term: Exercising regularly at least 3-5 days a week.       Increase Strength and Stamina  Yes       Intervention  Provide advice, education, support and counseling about physical activity/exercise needs.;Develop an individualized exercise prescription for aerobic and resistive training based on  initial evaluation findings, risk stratification, comorbidities and participant's personal goals.       Expected Outcomes  Short Term: Increase workloads from initial exercise prescription for resistance, speed, and METs.;Short Term: Perform resistance training exercises routinely during rehab and add in resistance training at home;Long Term: Improve cardiorespiratory fitness, muscular endurance and strength as measured by increased METs and functional capacity (6MWT)       Able to understand and use rate of perceived exertion (RPE) scale  Yes       Intervention  Provide education and explanation on how to use RPE scale       Expected Outcomes  Short Term: Able to use RPE daily in rehab to express subjective intensity level;Long Term:  Able to use RPE to guide intensity level when exercising independently       Able to understand and use Dyspnea scale  Yes       Intervention  Provide education and explanation on how to use Dyspnea scale  Expected Outcomes  Short Term: Able to use Dyspnea scale daily in rehab to express subjective sense of shortness of breath during exertion;Long Term: Able to use Dyspnea scale to guide intensity level when exercising independently       Knowledge and understanding of Target Heart Rate Range (THRR)  Yes       Intervention  Provide education and explanation of THRR including how the numbers were predicted and where they are located for reference       Expected Outcomes  Short Term: Able to state/look up THRR;Long Term: Able to use THRR to govern intensity when exercising independently;Short Term: Able to use daily as guideline for intensity in rehab       Able to check pulse independently  Yes       Intervention  Provide education and demonstration on how to check pulse in carotid and radial arteries.;Review the importance of being able to check your own pulse for safety during independent exercise       Expected Outcomes  Short Term: Able to explain why pulse  checking is important during independent exercise;Long Term: Able to check pulse independently and accurately       Understanding of Exercise Prescription  Yes       Intervention  Provide education, explanation, and written materials on patient's individual exercise prescription       Expected Outcomes  Short Term: Able to explain program exercise prescription;Long Term: Able to explain home exercise prescription to exercise independently          Copy of goals given to participant.

## 2017-08-13 ENCOUNTER — Encounter: Payer: Self-pay | Admitting: Internal Medicine

## 2017-08-13 NOTE — Progress Notes (Signed)
This encounter was created in error - please disregard.

## 2017-08-16 ENCOUNTER — Encounter: Payer: Medicare Other | Admitting: *Deleted

## 2017-08-16 DIAGNOSIS — Z951 Presence of aortocoronary bypass graft: Secondary | ICD-10-CM | POA: Diagnosis not present

## 2017-08-16 NOTE — Progress Notes (Signed)
Daily Session Note  Patient Details  Name: Omar Kennedy MRN: [id number] Date of Birth: 30-Jun-1946 Referring Provider:     Cardiac Rehab from 08/12/2017 in [facility]  Referring Provider  Henke      Encounter Date: 08/16/2017  Check In: Session Check In - 08/16/17 1633      Check-In   Location  [facility]    Staff Present  Nada Maclachlan, BA, ACSM CEP, Exercise Physiologist;Kelly Amedeo Plenty, BS, ACSM CEP, Exercise Physiologist;Carroll Enterkin, RN, BSN    Supervising physician immediately available to respond to emergencies  See telemetry face sheet for immediately available ER MD    Medication changes reported      No    Fall or balance concerns reported     No    Warm-up and Cool-down  Performed on first and last piece of equipment    Resistance Training Performed  Yes    VAD Patient?  No      Pain Assessment   Currently in Pain?  No/denies    Multiple Pain Sites  No          Social History   Tobacco Use  Smoking Status Former Smoker  . Last attempt to quit: 1980  . Years since quitting: 39.1  Smokeless Tobacco Never Used    Goals Met:  Exercise tolerated well Personal goals reviewed No report of cardiac concerns or symptoms Strength training completed today  Goals Unmet:  Not Applicable  Comments: First full day of exercise!  Patient was oriented to gym and equipment including functions, settings, policies, and procedures.  Patient's individual exercise prescription and treatment plan were reviewed.  All starting workloads were established based on the results of the 6 minute walk test done at initial orientation visit.  The plan for exercise progression was also introduced and progression will be customized based on patient's performance and goals.    Dr. Emily Filbert is Medical Director for [place] and [facility].

## 2017-08-18 ENCOUNTER — Encounter: Payer: Self-pay | Admitting: *Deleted

## 2017-08-18 DIAGNOSIS — Z951 Presence of aortocoronary bypass graft: Secondary | ICD-10-CM

## 2017-08-18 NOTE — Progress Notes (Signed)
Cardiac Individual Treatment Plan  Patient Details  Name: Omar Kennedy MRN: [id number] Date of Birth: 1945-11-05 Referring Provider:     Cardiac Rehab from 08/12/2017 in [facility] Cardiac and Pulmonary Rehab  Referring Provider  Henke      Initial Encounter Date:    Cardiac Rehab from 08/12/2017 in [facility] Cardiac and Pulmonary Rehab  Date  08/12/17  Referring Provider  Henke      Visit Diagnosis: S/P CABG x 4  Patient's Home Medications on Admission:  Current Outpatient Medications:  .  acetaminophen (TYLENOL) 325 MG tablet, Take by mouth., Disp: , Rfl:  .  aspirin EC 81 MG tablet, Take by mouth., Disp: , Rfl:  .  carvedilol (COREG) 12.5 MG tablet, Take 12.5 mg by mouth 2 (two) times daily with a meal., Disp: , Rfl: 11 .  fluticasone (FLONASE) 50 MCG/ACT nasal spray, Place 2 sprays into both nostrils daily., Disp: , Rfl: 11 .  furosemide (LASIX) 20 MG tablet, Take 20 mg by mouth daily., Disp: , Rfl: 0 .  HYDROmorphone (DILAUDID) 2 MG tablet, Take 2 mg by mouth every 6 (six) hours as needed. for pain, Disp: , Rfl: 0 .  KLOR-CON 10 10 MEQ tablet, Take 10 mEq by mouth daily., Disp: , Rfl: 0 .  lisinopril (PRINIVIL,ZESTRIL) 5 MG tablet, Take 5 mg by mouth daily., Disp: , Rfl: 3 .  metoprolol tartrate (LOPRESSOR) 25 MG tablet, Take 25 mg by mouth every 12 (twelve) hours., Disp: , Rfl: 2 .  nitroGLYCERIN (NITROSTAT) 0.4 MG SL tablet, PLEASE SEE ATTACHED FOR DETAILED DIRECTIONS, Disp: , Rfl: 0 .  rosuvastatin (CRESTOR) 40 MG tablet, TAKE ONE TABLET BY MOUTH AT NIGHT, Disp: , Rfl: 2 .  senna-docusate (SENOKOT-S) 8.6-50 MG tablet, Take by mouth., Disp: , Rfl:  .  sulfamethoxazole-trimethoprim (BACTRIM,SEPTRA) 400-80 MG tablet, TAKE 1 TABLET (80 MG OF TRIMETHOPRIM TOTAL) BY MOUTH 2 (TWO) TIMES DAILY, Disp: , Rfl: 0  Past Medical History: No past medical history on file.  Tobacco Use: Social History   Tobacco Use  Smoking Status Former Smoker  . Last attempt to quit: 1980  . Years since  quitting: 39.1  Smokeless Tobacco Never Used    Labs: Recent Review Flowsheet Data    There is no flowsheet data to display.       Exercise Target Goals:    Exercise Program Goal: Individual exercise prescription set using results from initial 6 min walk test and THRR while considering  patient's activity barriers and safety.   Exercise Prescription Goal: Initial exercise prescription builds to 30-45 minutes a day of aerobic activity, 2-3 days per week.  Home exercise guidelines will be given to patient during program as part of exercise prescription that the participant will acknowledge.  Activity Barriers & Risk Stratification: Activity Barriers & Cardiac Risk Stratification - 08/12/17 1415      Activity Barriers & Cardiac Risk Stratification   Activity Barriers  Incisional Pain    Cardiac Risk Stratification  Moderate       6 Minute Walk: 6 Minute Walk    Row Name 08/12/17 1407         6 Minute Walk   Distance  1500 feet     Walk Time  6 minutes     # of Rest Breaks  0     MPH  2.84     METS  3.99     RPE  8     Perceived Dyspnea   0  VO2 Peak  13.97     Symptoms  No     Resting HR  62 bpm     Resting BP  132/60     Resting Oxygen Saturation   100 %     Exercise Oxygen Saturation  during 6 min walk  99 %     Max Ex. HR  117 bpm     Max Ex. BP  172/72     2 Minute Post BP  136/66        Oxygen Initial Assessment:   Oxygen Re-Evaluation:   Oxygen Discharge (Final Oxygen Re-Evaluation):   Initial Exercise Prescription: Initial Exercise Prescription - 08/12/17 1400      Date of Initial Exercise RX and Referring Provider   Date  08/12/17    Referring Provider  Henke      Treadmill   MPH  2.8    Grade  2    Minutes  15    METs  4      Recumbant Bike   Level  4    RPM  60    Watts  50    Minutes  15    METs  4      T5 Nustep   Level  3    SPM  80    Minutes  15    METs  4      Prescription Details   Frequency (times per week)   3    Duration  Progress to 45 minutes of aerobic exercise without signs/symptoms of physical distress      Intensity   THRR 40-80% of Max Heartrate  96-131    Ratings of Perceived Exertion  11-13    Perceived Dyspnea  0-4      Resistance Training   Training Prescription  Yes    Weight  4 lb    Reps  10-15       Perform Capillary Blood Glucose checks as needed.  Exercise Prescription Changes: Exercise Prescription Changes    Row Name 08/12/17 1400             Response to Exercise   Blood Pressure (Admit)  132/60       Blood Pressure (Exercise)  172/72       Blood Pressure (Exit)  136/66       Heart Rate (Admit)  62 bpm       Heart Rate (Exercise)  117 bpm       Heart Rate (Exit)  61 bpm       Oxygen Saturation (Admit)  100 %       Oxygen Saturation (Exit)  99 %       Rating of Perceived Exertion (Exercise)  8          Exercise Comments: Exercise Comments    Row Name 08/16/17 1639           Exercise Comments   First full day of exercise!  Patient was oriented to gym and equipment including functions, settings, policies, and procedures.  Patient's individual exercise prescription and treatment plan were reviewed.  All starting workloads were established based on the results of the 6 minute walk test done at initial orientation visit.  The plan for exercise progression was also introduced and progression will be customized based on patient's performance and goals.          Exercise Goals and Review: Exercise Goals    Row Name 08/12/17 1406  Exercise Goals   Increase Physical Activity  Yes       Intervention  Provide advice, education, support and counseling about physical activity/exercise needs.;Develop an individualized exercise prescription for aerobic and resistive training based on initial evaluation findings, risk stratification, comorbidities and participant's personal goals.       Expected Outcomes  Short Term: Attend rehab on a regular basis  to increase amount of physical activity.;Long Term: Add in home exercise to make exercise part of routine and to increase amount of physical activity.;Long Term: Exercising regularly at least 3-5 days a week.       Increase Strength and Stamina  Yes       Intervention  Provide advice, education, support and counseling about physical activity/exercise needs.;Develop an individualized exercise prescription for aerobic and resistive training based on initial evaluation findings, risk stratification, comorbidities and participant's personal goals.       Expected Outcomes  Short Term: Increase workloads from initial exercise prescription for resistance, speed, and METs.;Short Term: Perform resistance training exercises routinely during rehab and add in resistance training at home;Long Term: Improve cardiorespiratory fitness, muscular endurance and strength as measured by increased METs and functional capacity (6MWT)       Able to understand and use rate of perceived exertion (RPE) scale  Yes       Intervention  Provide education and explanation on how to use RPE scale       Expected Outcomes  Short Term: Able to use RPE daily in rehab to express subjective intensity level;Long Term:  Able to use RPE to guide intensity level when exercising independently       Able to understand and use Dyspnea scale  Yes       Intervention  Provide education and explanation on how to use Dyspnea scale       Expected Outcomes  Short Term: Able to use Dyspnea scale daily in rehab to express subjective sense of shortness of breath during exertion;Long Term: Able to use Dyspnea scale to guide intensity level when exercising independently       Knowledge and understanding of Target Heart Rate Range (THRR)  Yes       Intervention  Provide education and explanation of THRR including how the numbers were predicted and where they are located for reference       Expected Outcomes  Short Term: Able to state/look up THRR;Long Term: Able  to use THRR to govern intensity when exercising independently;Short Term: Able to use daily as guideline for intensity in rehab       Able to check pulse independently  Yes       Intervention  Provide education and demonstration on how to check pulse in carotid and radial arteries.;Review the importance of being able to check your own pulse for safety during independent exercise       Expected Outcomes  Short Term: Able to explain why pulse checking is important during independent exercise;Long Term: Able to check pulse independently and accurately       Understanding of Exercise Prescription  Yes       Intervention  Provide education, explanation, and written materials on patient's individual exercise prescription       Expected Outcomes  Short Term: Able to explain program exercise prescription;Long Term: Able to explain home exercise prescription to exercise independently          Exercise Goals Re-Evaluation : Exercise Goals Re-Evaluation    [place] Name 08/16/17 1640  Exercise Goal Re-Evaluation   Exercise Goals Review  Increase Physical Activity;Able to understand and use rate of perceived exertion (RPE) scale;Knowledge and understanding of Target Heart Rate Range (THRR);Understanding of Exercise Prescription;Increase Strength and Stamina       Comments  Reviewed RPE scale, THR and program prescription with pt today.  Pt voiced understanding and was given a copy of goals to take home.        Expected Outcomes  Short: Use RPE daily to regulate intensity.  Long: Follow program prescription in THR.          Discharge Exercise Prescription (Final Exercise Prescription Changes): Exercise Prescription Changes - 08/12/17 1400      Response to Exercise   Blood Pressure (Admit)  132/60    Blood Pressure (Exercise)  172/72    Blood Pressure (Exit)  136/66    Heart Rate (Admit)  62 bpm    Heart Rate (Exercise)  117 bpm    Heart Rate (Exit)  61 bpm    Oxygen Saturation (Admit)   100 %    Oxygen Saturation (Exit)  99 %    Rating of Perceived Exertion (Exercise)  8       Nutrition:  Target Goals: Understanding of nutrition guidelines, daily intake of sodium '1500mg'$ , cholesterol '200mg'$ , calories 30% from fat and 7% or less from saturated fats, daily to have 5 or more servings of fruits and vegetables.  Biometrics: Pre Biometrics - 08/12/17 1405      Pre Biometrics   Height  5' 10.75" (1.797 m)    Weight  171 lb 12.8 oz (77.9 kg)    Waist Circumference  36 inches    Hip Circumference  40 inches    Waist to Hip Ratio  0.9 %    BMI (Calculated)  24.13    Single Leg Stand  10.5 seconds        Nutrition Therapy Plan and Nutrition Goals: Nutrition Therapy & Goals - 08/12/17 1412      Intervention Plan   Intervention  Prescribe, educate and counsel regarding individualized specific dietary modifications aiming towards targeted core components such as weight, hypertension, lipid management, diabetes, heart failure and other comorbidities.;Nutrition handout(s) given to patient.    Expected Outcomes  Short Term Goal: Understand basic principles of dietary content, such as calories, fat, sodium, cholesterol and nutrients.;Short Term Goal: A plan has been developed with personal nutrition goals set during dietitian appointment.;Long Term Goal: Adherence to prescribed nutrition plan.       Nutrition Assessments: Nutrition Assessments - 08/12/17 1149      MEDFICTS Scores   Pre Score  32       Nutrition Goals Re-Evaluation:   Nutrition Goals Discharge (Final Nutrition Goals Re-Evaluation):   Psychosocial: Target Goals: Acknowledge presence or absence of significant depression and/or stress, maximize coping skills, provide positive support system. Participant is able to verbalize types and ability to use techniques and skills needed for reducing stress and depression.   Initial Review & Psychosocial Screening: Initial Psych Review & Screening - 08/12/17 1412       Initial Review   Current issues with  Current Sleep Concerns      Family Dynamics   Good Support System?  Yes      Screening Interventions   Interventions  Encouraged to exercise;Program counselor consult;To provide support and resources with identified psychosocial needs    Expected Outcomes  Short Term goal: Utilizing psychosocial counselor, staff and physician to assist with identification of specific  Stressors or current issues interfering with healing process. Setting desired goal for each stressor or current issue identified.;Long Term Goal: Stressors or current issues are controlled or eliminated.;Short Term goal: Identification and review with participant of any Quality of Life or Depression concerns found by scoring the questionnaire.;Long Term goal: The participant improves quality of Life and PHQ9 Scores as seen by post scores and/or verbalization of changes       Quality of Life Scores:  Quality of Life - 08/12/17 1148      Quality of Life Scores   Health/Function Pre  21 %    Socioeconomic Pre  20.38 %    Psych/Spiritual Pre  20.71 %    Family Pre  21 %    GLOBAL Pre  20.8 %      Scores of 19 and below usually indicate a poorer quality of life in these areas.  A difference of  2-3 points is a clinically meaningful difference.  A difference of 2-3 points in the total score of the Quality of Life Index has been associated with significant improvement in overall quality of life, self-image, physical symptoms, and general health in studies assessing change in quality of life.  PHQ-9: Recent Review Flowsheet Data    Depression screen [facility] 2/9 08/12/2017   Decreased Interest 0   Down, Depressed, Hopeless 1   PHQ - 2 Score 1   Altered sleeping 0   Tired, decreased energy 1   Change in appetite 0   Feeling bad or failure about yourself  0   Trouble concentrating 0   Moving slowly or fidgety/restless 0   Suicidal thoughts 0   PHQ-9 Score 2   Difficult doing work/chores  Somewhat difficult     Interpretation of Total Score  Total Score Depression Severity:  1-4 = Minimal depression, 5-9 = Mild depression, 10-14 = Moderate depression, 15-19 = Moderately severe depression, 20-27 = Severe depression   Psychosocial Evaluation and Intervention:   Psychosocial Re-Evaluation:   Psychosocial Discharge (Final Psychosocial Re-Evaluation):   Vocational Rehabilitation: Provide vocational rehab assistance to qualifying candidates.   Vocational Rehab Evaluation & Intervention: Vocational Rehab - 08/12/17 1413      Initial Vocational Rehab Evaluation & Intervention   Assessment shows need for Vocational Rehabilitation  No       Education: Education Goals: Education classes will be provided on a variety of topics geared toward better understanding of heart health and risk factor modification. Participant will state understanding/return demonstration of topics presented as noted by education test scores.  Learning Barriers/Preferences: Learning Barriers/Preferences - 08/12/17 1413      Learning Barriers/Preferences   Learning Barriers  None    Learning Preferences  None       Education Topics:  AED/CPR: - Group verbal and written instruction with the use of models to demonstrate the basic use of the AED with the basic ABC's of resuscitation.   General Nutrition Guidelines/Fats and Fiber: -Group instruction provided by verbal, written material, models and posters to present the general guidelines for heart healthy nutrition. Gives an explanation and review of dietary fats and fiber.   Cardiac Rehab from 08/16/2017 in [facility] Cardiac and Pulmonary Rehab  Date  08/16/17  Educator  PI  Instruction Review Code  1- Verbalizes Understanding      Controlling Sodium/Reading Food Labels: -Group verbal and written material supporting the discussion of sodium use in heart healthy nutrition. Review and explanation with models, verbal and written materials for  utilization of the  food label.   Exercise Physiology & General Exercise Guidelines: - Group verbal and written instruction with models to review the exercise physiology of the cardiovascular system and associated critical values. Provides general exercise guidelines with specific guidelines to those with heart or lung disease.    Aerobic Exercise & Resistance Training: - Gives group verbal and written instruction on the various components of exercise. Focuses on aerobic and resistive training programs and the benefits of this training and how to safely progress through these programs..   Flexibility, Balance, Mind/Body Relaxation: Provides group verbal/written instruction on the benefits of flexibility and balance training, including mind/body exercise modes such as yoga, pilates and tai chi.  Demonstration and skill practice provided.   Stress and Anxiety: - Provides group verbal and written instruction about the health risks of elevated stress and causes of high stress.  Discuss the correlation between heart/lung disease and anxiety and treatment options. Review healthy ways to manage with stress and anxiety.   Depression: - Provides group verbal and written instruction on the correlation between heart/lung disease and depressed mood, treatment options, and the stigmas associated with seeking treatment.   Anatomy & Physiology of the Heart: - Group verbal and written instruction and models provide basic cardiac anatomy and physiology, with the coronary electrical and arterial systems. Review of Valvular disease and Heart Failure   Cardiac Procedures: - Group verbal and written instruction to review commonly prescribed medications for heart disease. Reviews the medication, class of the drug, and side effects. Includes the steps to properly store meds and maintain the prescription regimen. (beta blockers and nitrates)   Cardiac Medications I: - Group verbal and written instruction to  review commonly prescribed medications for heart disease. Reviews the medication, class of the drug, and side effects. Includes the steps to properly store meds and maintain the prescription regimen.   Cardiac Medications II: -Group verbal and written instruction to review commonly prescribed medications for heart disease. Reviews the medication, class of the drug, and side effects. (all other drug classes)    Go Sex-Intimacy & Heart Disease, Get SMART - Goal Setting: - Group verbal and written instruction through game format to discuss heart disease and the return to sexual intimacy. Provides group verbal and written material to discuss and apply goal setting through the application of the S.M.A.R.T. Method.   Other Matters of the Heart: - Provides group verbal, written materials and models to describe Stable Angina and Peripheral Artery. Includes description of the disease process and treatment options available to the cardiac patient.   Exercise & Equipment Safety: - Individual verbal instruction and demonstration of equipment use and safety with use of the equipment.   Cardiac Rehab from 08/16/2017 in [facility] Cardiac and Pulmonary Rehab  Date  08/12/17  Educator  [facility]  Instruction Review Code  1- Verbalizes Understanding      Infection Prevention: - Provides verbal and written material to individual with discussion of infection control including proper hand washing and proper equipment cleaning during exercise session.   Cardiac Rehab from 08/16/2017 in [facility] Cardiac and Pulmonary Rehab  Date  08/12/17  Educator  [facility]  Instruction Review Code  1- Verbalizes Understanding      Falls Prevention: - Provides verbal and written material to individual with discussion of falls prevention and safety.   Cardiac Rehab from 08/16/2017 in [facility] Cardiac and Pulmonary Rehab  Date  08/12/17  Educator  [facility]  Instruction Review Code  1- Verbalizes Understanding  Diabetes: - Individual verbal and  written instruction to review signs/symptoms of diabetes, desired ranges of glucose level fasting, after meals and with exercise. Acknowledge that pre and post exercise glucose checks will be done for 3 sessions at entry of program.   Know Your Numbers and Risk Factors: -Group verbal and written instruction about important numbers in your health.  Discussion of what are risk factors and how they play a role in the disease process.  Review of Cholesterol, Blood Pressure, Diabetes, and BMI and the role they play in your overall health.   Sleep Hygiene: -Provides group verbal and written instruction about how sleep can affect your health.  Define sleep hygiene, discuss sleep cycles and impact of sleep habits. Review good sleep hygiene tips.    Other: -Provides group and verbal instruction on various topics (see comments)   Knowledge Questionnaire Score: Knowledge Questionnaire Score - 08/12/17 1148      Knowledge Questionnaire Score   Pre Score  26/28 correct answers reviewed with [place]       Core Components/Risk Factors/Patient Goals at Admission: Personal Goals and Risk Factors at Admission - 08/12/17 1410      Core Components/Risk Factors/Patient Goals on Admission    Weight Management  Weight Maintenance;Yes    Intervention  Weight Management: Develop a combined nutrition and exercise program designed to reach desired caloric intake, while maintaining appropriate intake of nutrient and fiber, sodium and fats, and appropriate energy expenditure required for the weight goal.;Weight Management: Provide education and appropriate resources to help participant work on and attain dietary goals.;Weight Management/Obesity: Establish reasonable short term and long term weight goals.    Admit Weight  172 lb (78 kg)    Expected Outcomes  Short Term: Continue to assess and modify interventions until short term weight is achieved;Long Term: Adherence to nutrition and physical activity/exercise  program aimed toward attainment of established weight goal;Weight Maintenance: Understanding of the daily nutrition guidelines, which includes 25-35% calories from fat, 7% or less cal from saturated fats, less than '200mg'$  cholesterol, less than 1.5gm of sodium, & 5 or more servings of fruits and vegetables daily;Understanding recommendations for meals to include 15-35% energy as protein, 25-35% energy from fat, 35-60% energy from carbohydrates, less than '200mg'$  of dietary cholesterol, 20-35 gm of total fiber daily;Understanding of distribution of calorie intake throughout the day with the consumption of 4-5 meals/snacks    Hypertension  Yes    Intervention  Provide education on lifestyle modifcations including regular physical activity/exercise, weight management, moderate sodium restriction and increased consumption of fresh fruit, vegetables, and low fat dairy, alcohol moderation, and smoking cessation.;Monitor prescription use compliance.    Expected Outcomes  Short Term: Continued assessment and intervention until BP is < 140/48m HG in hypertensive participants. < 130/821mHG in hypertensive participants with diabetes, heart failure or chronic kidney disease.;Long Term: Maintenance of blood pressure at goal levels.    Lipids  Yes    Intervention  Provide education and support for participant on nutrition & aerobic/resistive exercise along with prescribed medications to achieve LDL '70mg'$ , HDL >'40mg'$ .    Expected Outcomes  Short Term: Participant states understanding of desired cholesterol values and is compliant with medications prescribed. Participant is following exercise prescription and nutrition guidelines.;Long Term: Cholesterol controlled with medications as prescribed, with individualized exercise RX and with personalized nutrition plan. Value goals: LDL < '70mg'$ , HDL > 40 mg.       Core Components/Risk Factors/Patient Goals Review:    Core Components/Risk Factors/Patient Goals at  Discharge  (Final Review):    ITP Comments: ITP Comments    Row Name 08/12/17 1404 08/18/17 0643         ITP Comments  Med Review completed. Initial ITP created. Diagnosis can be found Care Everywhere 08/02/17  30 Day review. Continue with ITP unless directed changes per Medical Director review.   New to program         Comments:

## 2017-08-18 NOTE — Progress Notes (Signed)
Daily Session Note  Patient Details  Name: Omar Kennedy MRN: [id number] Date of Birth: February 21, 1946 Referring Provider:     Cardiac Rehab from 08/12/2017 in [facility] Cardiac and Pulmonary Rehab  Referring Provider  [place]      Encounter Date: 08/18/2017  Check In: Session Check In - 08/18/17 1719      Check-In   Location  [facility]-Cardiac & Pulmonary Rehab    Staff Present  Renita Papa, RN Vickki Hearing, BA, ACSM CEP, Exercise Physiologist;Carroll Enterkin, RN, BSN    Supervising physician immediately available to respond to emergencies  See telemetry face sheet for immediately available ER MD    Medication changes reported      No    Fall or balance concerns reported     No    Warm-up and Cool-down  Performed on first and last piece of equipment    Resistance Training Performed  Yes    VAD Patient?  No      Pain Assessment   Currently in Pain?  No/denies    Multiple Pain Sites  No          Social History   Tobacco Use  Smoking Status Former Smoker  . Last attempt to quit: 1980  . Years since quitting: 39.1  Smokeless Tobacco Never Used    Goals Met:  Independence with exercise equipment Exercise tolerated well No report of cardiac concerns or symptoms Strength training completed today  Goals Unmet:  Not Applicable  Comments: Pt able to follow exercise prescription today without complaint.  Will continue to monitor for progression.    Dr. Emily Filbert is Medical Director for [place] and [facility].

## 2017-08-19 DIAGNOSIS — Z951 Presence of aortocoronary bypass graft: Secondary | ICD-10-CM | POA: Diagnosis not present

## 2017-08-19 NOTE — Progress Notes (Signed)
Daily Session Note  Patient Details  Name: YSIDRO RAMSAY MRN: [id number] Date of Birth: 03/05/1946 Referring Provider:     Cardiac Rehab from 08/12/2017 in [facility] Cardiac and Pulmonary Rehab  Referring Provider  Henke      Encounter Date: 08/19/2017  Check In: Session Check In - 08/19/17 1715      Check-In   Location  [facility]-Cardiac & Pulmonary Rehab    Staff Present  Earlean Shawl, BS, ACSM CEP, Exercise Physiologist;Meredith Sherryll Burger, RN BSN;Jeniyah Menor Flavia Shipper    Supervising physician immediately available to respond to emergencies  See telemetry face sheet for immediately available ER MD    Medication changes reported      No    Fall or balance concerns reported     No    Warm-up and Cool-down  Performed on first and last piece of equipment    Resistance Training Performed  Yes    VAD Patient?  No      Pain Assessment   Currently in Pain?  No/denies          Social History   Tobacco Use  Smoking Status Former Smoker  . Last attempt to quit: 1980  . Years since quitting: 39.1  Smokeless Tobacco Never Used    Goals Met:  Independence with exercise equipment Exercise tolerated well No report of cardiac concerns or symptoms Strength training completed today  Goals Unmet:  Not Applicable  Comments: Pt able to follow exercise prescription today without complaint.  Will continue to monitor for progression.   Dr. Emily Filbert is Medical Director for [place] and [facility].

## 2017-08-23 ENCOUNTER — Encounter: Payer: Medicare Other | Attending: Internal Medicine | Admitting: *Deleted

## 2017-08-23 DIAGNOSIS — Z951 Presence of aortocoronary bypass graft: Secondary | ICD-10-CM | POA: Diagnosis present

## 2017-08-23 NOTE — Progress Notes (Signed)
Daily Session Note  Patient Details  Name: Omar Kennedy MRN: [id number] Date of Birth: 31-Jan-1946 Referring Provider:     Cardiac Rehab from 08/12/2017 in [facility] Cardiac and Pulmonary Rehab  Referring Provider  Henke      Encounter Date: 08/23/2017  Check In: Session Check In - 08/23/17 1644      Check-In   Location  [facility]-Cardiac & Pulmonary Rehab    Staff Present  Earlean Shawl, BS, ACSM CEP, Exercise Physiologist;Amanda Oletta Darter, BA, ACSM CEP, Exercise Physiologist;Carroll Enterkin, RN, BSN    Supervising physician immediately available to respond to emergencies  See telemetry face sheet for immediately available ER MD    Medication changes reported      No    Fall or balance concerns reported     No    Warm-up and Cool-down  Performed on first and last piece of equipment    Resistance Training Performed  Yes    VAD Patient?  No      Pain Assessment   Currently in Pain?  No/denies    Multiple Pain Sites  No          Social History   Tobacco Use  Smoking Status Former Smoker  . Last attempt to quit: 1980  . Years since quitting: 39.1  Smokeless Tobacco Never Used    Goals Met:  Independence with exercise equipment Exercise tolerated well No report of cardiac concerns or symptoms Strength training completed today  Goals Unmet:  Not Applicable  Comments: Pt able to follow exercise prescription today without complaint.  Will continue to monitor for progression.    Dr. Emily Filbert is Medical Director for [place] and [facility].

## 2017-08-25 DIAGNOSIS — Z951 Presence of aortocoronary bypass graft: Secondary | ICD-10-CM

## 2017-08-25 NOTE — Progress Notes (Signed)
Daily Session Note  Patient Details  Name: SARVESH MEDDAUGH MRN: [id number] Date of Birth: [birth year] Referring Provider:     Cardiac Rehab from 08/12/2017 in [facility] Cardiac and Pulmonary Rehab  Referring Provider  Henke      Encounter Date: 08/25/2017  Check In: Session Check In - 08/25/17 1635      Check-In   Location  [facility]-Cardiac & Pulmonary Rehab    Staff Present  Renita Papa, RN Vickki Hearing, BA, ACSM CEP, Exercise Physiologist;Carroll Enterkin, RN, BSN    Supervising physician immediately available to respond to emergencies  See telemetry face sheet for immediately available ER MD    Medication changes reported      No    Fall or balance concerns reported     No    Warm-up and Cool-down  Performed on first and last piece of equipment    Resistance Training Performed  Yes    VAD Patient?  No      Pain Assessment   Currently in Pain?  No/denies    Multiple Pain Sites  No        Exercise Prescription Changes - 08/25/17 1400      Response to Exercise   Blood Pressure (Admit)  126/82    Blood Pressure (Exercise)  156/84    Blood Pressure (Exit)  104/62    Heart Rate (Admit)  64 bpm    Heart Rate (Exercise)  110 bpm    Heart Rate (Exit)  57 bpm    Rating of Perceived Exertion (Exercise)  11    Symptoms  none    Duration  Progress to 45 minutes of aerobic exercise without signs/symptoms of physical distress    Intensity  THRR unchanged      Progression   Progression  Continue to progress workloads to maintain intensity without signs/symptoms of physical distress.    Average METs  3.15      Resistance Training   Training Prescription  Yes    Weight  4 lb    Reps  10-15      Interval Training   Interval Training  No      Recumbant Bike   Level  4    RPM  60    Watts  22    Minutes  15    METs  2.8      REL-XR   Level  3    Minutes  15    METs  3.95       Social History   Tobacco Use  Smoking Status Former Smoker  . Last attempt to quit: 1980   . Years since quitting: 39.1  Smokeless Tobacco Never Used    Goals Met:  Independence with exercise equipment Exercise tolerated well No report of cardiac concerns or symptoms Pt able to follow exercise prescription today without complaint.  Will continue to monitor for progression.  Goals Unmet:  Not Applicable  Comments: Pt able to follow exercise prescription today without complaint.  Will continue to monitor for progression.    Dr. Emily Filbert is Medical Director for [place] and [facility].

## 2017-08-26 DIAGNOSIS — Z951 Presence of aortocoronary bypass graft: Secondary | ICD-10-CM

## 2017-08-26 NOTE — Progress Notes (Signed)
Daily Session Note  Patient Details  Name: CHAZE HRUSKA MRN: [id number] Date of Birth: 1945/09/23 Referring Provider:     Cardiac Rehab from 08/12/2017 in [facility] Cardiac and Pulmonary Rehab  Referring Provider  Hen[place]      Encounter Date: 08/26/2017  Check In: Session Check In - 08/26/17 1709      Check-In   Location  [facility]Cardiac & Pulmonary Rehab    Staff Present  Earlean Shawl, BS, ACSM CEP, Exercise Physiologist;Meredith Sherryll Burger, RN BSN;Braelynne Garinger Flavia Shipper    Supervising physician immediately available to respond to emergencies  See telemetry face sheet for immediately available ER MD    Medication changes reported      No    Fall or balance concerns reported     No    Warm-up and Cool-down  Performed on first and last piece of equipment    Resistance Training Performed  Yes    VAD Patient?  No      Pain Assessment   Currently in Pain?  No/denies          Social History   Tobacco Use  Smoking Status Former Smoker  . Last attempt to quit: 1980  . Years since quitting: 39.1  Smokeless Tobacco Never Used    Goals Met:  Independence with exercise equipment Exercise tolerated well No report of cardiac concerns or symptoms Strength training completed today  Goals Unmet:  Not Applicable  Comments: Pt able to follow exercise prescription today without complaint.  Will continue to monitor for progression.   Dr. Emily Filbert is Medical Director for [place] and [facility].

## 2017-08-30 ENCOUNTER — Encounter: Payer: Medicare Other | Admitting: *Deleted

## 2017-08-30 DIAGNOSIS — Z951 Presence of aortocoronary bypass graft: Secondary | ICD-10-CM

## 2017-08-30 NOTE — Progress Notes (Signed)
Daily Session Note  Patient Details  Name: SANUEL LADNIER MRN: [id number] Date of Birth: [birth year] Referring Provider:     Cardiac Rehab from 08/12/2017 in [facility] Cardiac and Pulmonary Rehab  Referring Provider  Henke      Encounter Date: 08/30/2017  Check In: Session Check In - 08/30/17 1740      Check-In   Location  [facility]Cardiac & Pulmonary Rehab    Staff Present  Nyoka Cowden, RN, BSN, [place], BS, ACSM CEP, Exercise Physiologist;Amanda Oletta Darter, [place], ACSM CEP, Exercise Physiologist;Joseph Flavia Shipper    Supervising physician immediately available to respond to emergencies  See telemetry face sheet for immediately available ER MD    Medication changes reported      No    Fall or balance concerns reported     No    Warm-up and Cool-down  Performed on first and last piece of equipment    Resistance Training Performed  Yes    VAD Patient?  No      Pain Assessment   Currently in Pain?  No/denies    Multiple Pain Sites  No          Social History   Tobacco Use  Smoking Status Former Smoker  . Last attempt to quit: 1980  . Years since quitting: 39.1  Smokeless Tobacco Never Used    Goals Met:  Independence with exercise equipment Exercise tolerated well No report of cardiac concerns or symptoms Strength training completed today  Goals Unmet:  Not Applicable  Comments: Pt able to follow exercise prescription today without complaint.  Will continue to monitor for progression.    Dr. Emily Filbert is Medical Director for [place] and [facility].

## 2017-09-01 ENCOUNTER — Encounter: Payer: Medicare Other | Admitting: *Deleted

## 2017-09-01 DIAGNOSIS — Z951 Presence of aortocoronary bypass graft: Secondary | ICD-10-CM

## 2017-09-01 NOTE — Progress Notes (Signed)
Daily Session Note  Patient Details  Name: Omar Kennedy MRN: [id number] Date of Birth: [birth year] Referring Provider:     Cardiac Rehab from 08/12/2017 in [facility] Cardiac and Pulmonary Rehab  Referring Provider  Henke      Encounter Date: 09/01/2017  Check In: Session Check In - 09/01/17 1700      Check-In   Location  [facility]-Cardiac & Pulmonary Rehab    Staff Present  Renita Papa, RN Vickki Hearing, BA, ACSM CEP, Exercise Physiologist;Carroll Enterkin, RN, BSN    Supervising physician immediately available to respond to emergencies  See telemetry face sheet for immediately available ER MD    Medication changes reported      No    Fall or balance concerns reported     No    Warm-up and Cool-down  Performed on first and last piece of equipment    Resistance Training Performed  Yes    VAD Patient?  No      Pain Assessment   Currently in Pain?  No/denies          Social History   Tobacco Use  Smoking Status Former Smoker  . Last attempt to quit: 1980  . Years since quitting: 39.1  Smokeless Tobacco Never Used    Goals Met:  Independence with exercise equipment Exercise tolerated well No report of cardiac concerns or symptoms Strength training completed today  Goals Unmet:  Not Applicable  Comments: Pt able to follow exercise prescription today without complaint.  Will continue to monitor for progression.    Dr. Emily Filbert is Medical Director for [facility] and [facility].

## 2017-09-02 DIAGNOSIS — Z951 Presence of aortocoronary bypass graft: Secondary | ICD-10-CM

## 2017-09-02 NOTE — Progress Notes (Signed)
Daily Session Note  Patient Details  Name: Omar Kennedy MRN: [id number] Date of Birth: [birth year] Referring Provider:     Cardiac Rehab from 08/12/2017 in [facility] Cardiac and Pulmonary Rehab  Referring Provider  Henke      Encounter Date: 09/02/2017  Check In: Session Check In - 09/02/17 1638      Check-In   Location  [facility]Cardiac & Pulmonary Rehab    Staff Present  Earlean Shawl, BS, ACSM CEP, Exercise Physiologist;Meredith Sherryll Burger, RN BSN;Sharron Petruska Flavia Shipper    Supervising physician immediately available to respond to emergencies  See telemetry face sheet for immediately available ER MD    Medication changes reported      No    Fall or balance concerns reported     No    Warm-up and Cool-down  Performed on first and last piece of equipment    Resistance Training Performed  Yes    VAD Patient?  No      Pain Assessment   Currently in Pain?  No/denies          Social History   Tobacco Use  Smoking Status Former Smoker  . Last attempt to quit: 1980  . Years since quitting: 39.1  Smokeless Tobacco Never Used    Goals Met:  Independence with exercise equipment Exercise tolerated well No report of cardiac concerns or symptoms Strength training completed today  Goals Unmet:  Not Applicable  Comments: Pt able to follow exercise prescription today without complaint.  Will continue to monitor for progression.   Dr. Emily Filbert is Medical Director for [place] and [facility].

## 2017-09-06 DIAGNOSIS — Z951 Presence of aortocoronary bypass graft: Secondary | ICD-10-CM

## 2017-09-06 NOTE — Progress Notes (Signed)
Daily Session Note  Patient Details  Name: Omar Kennedy MRN: [id number] Date of Birth: 1946/02/17 Referring Provider:     Cardiac Rehab from 08/12/2017 in [facility] Cardiac and Pulmonary Rehab  Referring Provider  Henke      Encounter Date: 09/06/2017  Check In: Session Check In - 09/06/17 1621      Check-In   Location  [facility]Cardiac [facility] Pulmonary Rehab    Staff Present  Earlean Shawl, BS, ACSM CEP, Exercise Physiologist;Amanda Oletta Darter, BA, ACSM CEP, Exercise Physiologist;Carroll Enterkin, RN, BSN    Supervising physician immediately available to respond to emergencies  See telemetry face sheet for immediately available ER MD    Medication changes reported      No    Fall or balance concerns reported     No    Warm-up and Cool-down  Performed on first and last piece of equipment    Resistance Training Performed  Yes    VAD Patient?  No      Pain Assessment   Currently in Pain?  No/denies    Multiple Pain Sites  No          Social History   Tobacco Use  Smoking Status Former Smoker  . Last attempt to quit: 1980  . Years since quitting: 39.1  Smokeless Tobacco Never Used    Goals Met:  Independence with exercise equipment Exercise tolerated well No report of cardiac concerns or symptoms Strength training completed today  Goals Unmet:  Not Applicable  Comments: Pt able to follow exercise prescription today without complaint.  Will continue to monitor for progression.    Dr. Emily Filbert is Medical Director for [place] and [facility].

## 2017-09-09 ENCOUNTER — Telehealth: Payer: Self-pay | Admitting: *Deleted

## 2017-09-09 NOTE — Telephone Encounter (Signed)
Omar Kennedy called to state he will not be able to attend Cardiac Rehab for the rest of the week.

## 2017-09-13 ENCOUNTER — Encounter: Payer: Medicare Other | Admitting: *Deleted

## 2017-09-13 DIAGNOSIS — Z951 Presence of aortocoronary bypass graft: Secondary | ICD-10-CM

## 2017-09-13 NOTE — Progress Notes (Signed)
Daily Session Note  Patient Details  Name: MIKIAH DEMOND MRN: [id number] Date of Birth: 1946/06/08 Referring Provider:     Cardiac Rehab from 08/12/2017 in [facility] Cardiac and Pulmonary Rehab  Referring Provider  Henke      Encounter Date: 09/13/2017  Check In: Session Check In - 09/13/17 1731      Check-In   Location  [facility]Cardiac & Pulmonary Rehab    Staff Present  Earlean Shawl, BS, ACSM CEP, Exercise Physiologist;Amanda Oletta Darter, BA, ACSM CEP, Exercise Physiologist;Meredith Sherryll Burger, RN BSN;Susanne Bice, RN, BSN, Florestine Avers, RN BSN    Supervising physician immediately available to respond to emergencies  See telemetry face sheet for immediately available ER MD    Medication changes reported      No    Fall or balance concerns reported     No    Warm-up and Cool-down  Performed on first and last piece of equipment    Resistance Training Performed  Yes    VAD Patient?  No      Pain Assessment   Currently in Pain?  No/denies    Multiple Pain Sites  No          Social History   Tobacco Use  Smoking Status Former Smoker  . Last attempt to quit: 1980  . Years since quitting: 39.1  Smokeless Tobacco Never Used    Goals Met:  Independence with exercise equipment Exercise tolerated well No report of cardiac concerns or symptoms Strength training completed today  Goals Unmet:  Not Applicable  Comments: Pt able to follow exercise prescription today without complaint.  Will continue to monitor for progression.    Dr. Emily Filbert is Medical Director for [place] and [facility].

## 2017-09-14 ENCOUNTER — Telehealth: Payer: Self-pay

## 2017-09-14 NOTE — Telephone Encounter (Signed)
Personal goals reviewed

## 2017-09-14 NOTE — Telephone Encounter (Signed)
called to review personal goals

## 2017-09-15 ENCOUNTER — Encounter: Payer: Medicare Other | Admitting: *Deleted

## 2017-09-15 ENCOUNTER — Encounter: Payer: Self-pay | Admitting: *Deleted

## 2017-09-15 DIAGNOSIS — Z951 Presence of aortocoronary bypass graft: Secondary | ICD-10-CM

## 2017-09-15 NOTE — Progress Notes (Signed)
Cardiac Individual Treatment Plan  Patient Details  Name: Omar Kennedy MRN: [id number] Date of Birth: 1945-11-05 Referring Provider:     Cardiac Rehab from 08/12/2017 in [facility] Cardiac and Pulmonary Rehab  Referring Provider  Henke      Initial Encounter Date:    Cardiac Rehab from 08/12/2017 in [facility] Cardiac and Pulmonary Rehab  Date  08/12/17  Referring Provider  Henke      Visit Diagnosis: S/P CABG x 4  Patient's Home Medications on Admission:  Current Outpatient Medications:  .  acetaminophen (TYLENOL) 325 MG tablet, Take by mouth., Disp: , Rfl:  .  aspirin EC 81 MG tablet, Take by mouth., Disp: , Rfl:  .  carvedilol (COREG) 12.5 MG tablet, Take 12.5 mg by mouth 2 (two) times daily with a meal., Disp: , Rfl: 11 .  fluticasone (FLONASE) 50 MCG/ACT nasal spray, Place 2 sprays into both nostrils daily., Disp: , Rfl: 11 .  furosemide (LASIX) 20 MG tablet, Take 20 mg by mouth daily., Disp: , Rfl: 0 .  HYDROmorphone (DILAUDID) 2 MG tablet, Take 2 mg by mouth every 6 (six) hours as needed. for pain, Disp: , Rfl: 0 .  KLOR-CON 10 10 MEQ tablet, Take 10 mEq by mouth daily., Disp: , Rfl: 0 .  lisinopril (PRINIVIL,ZESTRIL) 5 MG tablet, Take 5 mg by mouth daily., Disp: , Rfl: 3 .  metoprolol tartrate (LOPRESSOR) 25 MG tablet, Take 25 mg by mouth every 12 (twelve) hours., Disp: , Rfl: 2 .  nitroGLYCERIN (NITROSTAT) 0.4 MG SL tablet, PLEASE SEE ATTACHED FOR DETAILED DIRECTIONS, Disp: , Rfl: 0 .  rosuvastatin (CRESTOR) 40 MG tablet, TAKE ONE TABLET BY MOUTH AT NIGHT, Disp: , Rfl: 2 .  senna-docusate (SENOKOT-S) 8.6-50 MG tablet, Take by mouth., Disp: , Rfl:  .  sulfamethoxazole-trimethoprim (BACTRIM,SEPTRA) 400-80 MG tablet, TAKE 1 TABLET (80 MG OF TRIMETHOPRIM TOTAL) BY MOUTH 2 (TWO) TIMES DAILY, Disp: , Rfl: 0  Past Medical History: No past medical history on file.  Tobacco Use: Social History   Tobacco Use  Smoking Status Former Smoker  . Last attempt to quit: 1980  . Years since  quitting: 39.1  Smokeless Tobacco Never Used    Labs: Recent Review Flowsheet Data    There is no flowsheet data to display.       Exercise Target Goals:    Exercise Program Goal: Individual exercise prescription set using results from initial 6 min walk test and THRR while considering  patient's activity barriers and safety.   Exercise Prescription Goal: Initial exercise prescription builds to 30-45 minutes a day of aerobic activity, 2-3 days per week.  Home exercise guidelines will be given to patient during program as part of exercise prescription that the participant will acknowledge.  Activity Barriers & Risk Stratification: Activity Barriers & Cardiac Risk Stratification - 08/12/17 1415      Activity Barriers & Cardiac Risk Stratification   Activity Barriers  Incisional Pain    Cardiac Risk Stratification  Moderate       6 Minute Walk: 6 Minute Walk    Row Name 08/12/17 1407         6 Minute Walk   Distance  1500 feet     Walk Time  6 minutes     # of Rest Breaks  0     MPH  2.84     METS  3.99     RPE  8     Perceived Dyspnea   0  VO2 Peak  13.97     Symptoms  No     Resting HR  62 bpm     Resting BP  132/60     Resting Oxygen Saturation   100 %     Exercise Oxygen Saturation  during 6 min walk  99 %     Max Ex. HR  117 bpm     Max Ex. BP  172/72     2 Minute Post BP  136/66        Oxygen Initial Assessment:   Oxygen Re-Evaluation:   Oxygen Discharge (Final Oxygen Re-Evaluation):   Initial Exercise Prescription: Initial Exercise Prescription - 08/12/17 1400      Date of Initial Exercise RX and Referring Provider   Date  08/12/17    Referring Provider  Henke      Treadmill   MPH  2.8    Grade  2    Minutes  15    METs  4      Recumbant Bike   Level  4    RPM  60    Watts  50    Minutes  15    METs  4      T5 Nustep   Level  3    SPM  80    Minutes  15    METs  4      Prescription Details   Frequency (times per week)   3    Duration  Progress to 45 minutes of aerobic exercise without signs/symptoms of physical distress      Intensity   THRR 40-80% of Max Heartrate  96-131    Ratings of Perceived Exertion  11-13    Perceived Dyspnea  0-4      Resistance Training   Training Prescription  Yes    Weight  4 lb    Reps  10-15       Perform Capillary Blood Glucose checks as needed.  Exercise Prescription Changes: Exercise Prescription Changes    Row Name 08/12/17 1400 08/25/17 1400 09/07/17 1600         Response to Exercise   Blood Pressure (Admit)  132/60  126/82  -     Blood Pressure (Exercise)  172/72  156/84  142/84     Blood Pressure (Exit)  136/66  104/62  142/80     Heart Rate (Admit)  62 bpm  64 bpm  76 bpm     Heart Rate (Exercise)  117 bpm  110 bpm  105 bpm     Heart Rate (Exit)  61 bpm  57 bpm  79 bpm     Oxygen Saturation (Admit)  100 %  -  -     Oxygen Saturation (Exit)  99 %  -  -     Rating of Perceived Exertion (Exercise)  _0 Symptoms  -  none  none     Duration  -  Progress to 45 minutes of aerobic exercise without signs/symptoms of physical distress  Continue with 45 min of aerobic exercise without signs/symptoms of physical distress.     Intensity  -  THRR unchanged  -       Progression   Progression  -  Continue to progress workloads to maintain intensity without signs/symptoms of physical distress.  Continue to progress workloads to maintain intensity without signs/symptoms of physical distress.     Average METs  -  3.15  3.1       Resistance Training   Training Prescription  -  Yes  Yes     Weight  -  4 lb  4 lb     Reps  -  10-15  10-15       Interval Training   Interval Training  -  No  No       Treadmill   MPH  -  -  2.9     Grade  -  -  2.5     Minutes  -  -  15     METs  -  -  4.42       Recumbant Bike   Level  -  4  -     RPM  -  60  -     Watts  -  22  -     Minutes  -  15  -     METs  -  2.8  -       REL-XR   Level  -  3  3      Minutes  -  15  15     METs  -  3.95  1.7        Exercise Comments: Exercise Comments    Row Name 08/16/17 1639           Exercise Comments   First full day of exercise!  Patient was oriented to gym and equipment including functions, settings, policies, and procedures.  Patient's individual exercise prescription and treatment plan were reviewed.  All starting workloads were established based on the results of the 6 minute walk test done at initial orientation visit.  The plan for exercise progression was also introduced and progression will be customized based on patient's performance and goals.          Exercise Goals and Review: Exercise Goals    Row Name 08/12/17 1406             Exercise Goals   Increase Physical Activity  Yes       Intervention  Provide advice, education, support and counseling about physical activity/exercise needs.;Develop an individualized exercise prescription for aerobic and resistive training based on initial evaluation findings, risk stratification, comorbidities and participant's personal goals.       Expected Outcomes  Short Term: Attend rehab on a regular basis to increase amount of physical activity.;Long Term: Add in home exercise to make exercise part of routine and to increase amount of physical activity.;Long Term: Exercising regularly at least 3-5 days a week.       Increase Strength and Stamina  Yes       Intervention  Provide advice, education, support and counseling about physical activity/exercise needs.;Develop an individualized exercise prescription for aerobic and resistive training based on initial evaluation findings, risk stratification, comorbidities and participant's personal goals.       Expected Outcomes  Short Term: Increase workloads from initial exercise prescription for resistance, speed, and METs.;Short Term: Perform resistance training exercises routinely during rehab and add in resistance training at home;Long Term: Improve  cardiorespiratory fitness, muscular endurance and strength as measured by increased METs and functional capacity (6MWT)       Able to understand and use rate of perceived exertion (RPE) scale  Yes       Intervention  Provide education and explanation on how to use RPE scale       Expected Outcomes  Short Term: Able to  use RPE daily in rehab to express subjective intensity level;Long Term:  Able to use RPE to guide intensity level when exercising independently       Able to understand and use Dyspnea scale  Yes       Intervention  Provide education and explanation on how to use Dyspnea scale       Expected Outcomes  Short Term: Able to use Dyspnea scale daily in rehab to express subjective sense of shortness of breath during exertion;Long Term: Able to use Dyspnea scale to guide intensity level when exercising independently       Knowledge and understanding of Target Heart Rate Range (THRR)  Yes       Intervention  Provide education and explanation of THRR including how the numbers were predicted and where they are located for reference       Expected Outcomes  Short Term: Able to state/look up THRR;Long Term: Able to use THRR to govern intensity when exercising independently;Short Term: Able to use daily as guideline for intensity in rehab       Able to check pulse independently  Yes       Intervention  Provide education and demonstration on how to check pulse in carotid and radial arteries.;Review the importance of being able to check your own pulse for safety during independent exercise       Expected Outcomes  Short Term: Able to explain why pulse checking is important during independent exercise;Long Term: Able to check pulse independently and accurately       Understanding of Exercise Prescription  Yes       Intervention  Provide education, explanation, and written materials on patient's individual exercise prescription       Expected Outcomes  Short Term: Able to explain program exercise  prescription;Long Term: Able to explain home exercise prescription to exercise independently          Exercise Goals Re-Evaluation : Exercise Goals Re-Evaluation    Row Name 08/16/17 1640 08/25/17 1415 09/07/17 1604         Exercise Goal Re-Evaluation   Exercise Goals Review  Increase Physical Activity;Able to understand and use rate of perceived exertion (RPE) scale;Knowledge and understanding of Target Heart Rate Range (THRR);Understanding of Exercise Prescription;Increase Strength and Stamina  Increase Physical Activity;Able to understand and use rate of perceived exertion (RPE) scale;Increase Strength and Stamina  Increase Physical Activity;Able to understand and use rate of perceived exertion (RPE) scale;Increase Strength and Stamina     Comments  Reviewed RPE scale, THR and program prescription with pt today.  Pt voiced understanding and was given a copy of goals to take home.   Omar is tolerating exercise well.  Staff will continue to monitor progress.  Laron is progressing well and has moved up his speed and incline on TM.  Staff will continue to monitor     Expected Outcomes  Short: Use RPE daily to regulate intensity.  Long: Follow program prescription in THR.  Short - Fabio will attend class regularly Long - Audra will improve MET level  Short - Nazire will attend class regularly, staff will review home exercise Long - Lorcan will improve overall MET level        Discharge Exercise Prescription (Final Exercise Prescription Changes): Exercise Prescription Changes - 09/07/17 1600      Response to Exercise   Blood Pressure (Exercise)  142/84    Blood Pressure (Exit)  142/80    Heart Rate (Admit)  76 bpm  Heart Rate (Exercise)  105 bpm    Heart Rate (Exit)  79 bpm    Rating of Perceived Exertion (Exercise)  13    Symptoms  none    Duration  Continue with 45 min of aerobic exercise without signs/symptoms of physical distress.      Progression   Progression  Continue to progress  workloads to maintain intensity without signs/symptoms of physical distress.    Average METs  3.1      Resistance Training   Training Prescription  Yes    Weight  4 lb    Reps  10-15      Interval Training   Interval Training  No      Treadmill   MPH  2.9    Grade  2.5    Minutes  15    METs  4.42      REL-XR   Level  3    Minutes  15    METs  1.7       Nutrition:  Target Goals: Understanding of nutrition guidelines, daily intake of sodium <1534m, cholesterol <2023m calories 30% from fat and 7% or less from saturated fats, daily to have 5 or more servings of fruits and vegetables.  Biometrics: Pre Biometrics - 08/12/17 1405      Pre Biometrics   Height  5' 10.75" (1.797 m)    Weight  171 lb 12.8 oz (77.9 kg)    Waist Circumference  36 inches    Hip Circumference  40 inches    Waist to Hip Ratio  0.9 %    BMI (Calculated)  24.13    Single Leg Stand  10.5 seconds        Nutrition Therapy Plan and Nutrition Goals: Nutrition Therapy & Goals - 08/12/17 1412      Intervention Plan   Intervention  Prescribe, educate and counsel regarding individualized specific dietary modifications aiming towards targeted core components such as weight, hypertension, lipid management, diabetes, heart failure and other comorbidities.;Nutrition handout(s) given to patient.    Expected Outcomes  Short Term Goal: Understand basic principles of dietary content, such as calories, fat, sodium, cholesterol and nutrients.;Short Term Goal: A plan has been developed with personal nutrition goals set during dietitian appointment.;Long Term Goal: Adherence to prescribed nutrition plan.       Nutrition Assessments: Nutrition Assessments - 08/12/17 1149      MEDFICTS Scores   Pre Score  32       Nutrition Goals Re-Evaluation: Nutrition Goals Re-Evaluation    [place] 09/14/17 1057             Goals   Comment  Johns wife has already adjusted their diet to lower sodium, no red meat or  fried foods.  They want to schedule to meet together with the RD       Expected Outcome  Short - JoHastenill continue to eat a heart healthy diet and meet with the RD Long - Leslee will maintain heart healthy diet           Nutrition Goals Discharge (Final Nutrition Goals Re-Evaluation): Nutrition Goals Re-Evaluation - 09/14/17 1057      Goals   Comment  Johns wife has already adjusted their diet to lower sodium, no red meat or fried foods.  They want to schedule to meet together with the RD    Expected Outcome  Short - JoImraanill continue to eat a heart healthy diet and meet with the RD Long -  Simeon will maintain heart healthy diet        Psychosocial: Target Goals: Acknowledge presence or absence of significant depression and/or stress, maximize coping skills, provide positive support system. Participant is able to verbalize types and ability to use techniques and skills needed for reducing stress and depression.   Initial Review & Psychosocial Screening: Initial Psych Review & Screening - 08/12/17 1412      Initial Review   Current issues with  Current Sleep Concerns      Family Dynamics   Good Support System?  Yes      Screening Interventions   Interventions  Encouraged to exercise;Program counselor consult;To provide support and resources with identified psychosocial needs    Expected Outcomes  Short Term goal: Utilizing psychosocial counselor, staff and physician to assist with identification of specific Stressors or current issues interfering with healing process. Setting desired goal for each stressor or current issue identified.;Long Term Goal: Stressors or current issues are controlled or eliminated.;Short Term goal: Identification and review with participant of any Quality of Life or Depression concerns found by scoring the questionnaire.;Long Term goal: The participant improves quality of Life and PHQ9 Scores as seen by post scores and/or verbalization of changes       Quality  of Life Scores:  Quality of Life - 08/12/17 1148      Quality of Life Scores   Health/Function Pre  21 %    Socioeconomic Pre  20.38 %    Psych/Spiritual Pre  20.71 %    Family Pre  21 %    GLOBAL Pre  20.8 %      Scores of 19 and below usually indicate a poorer quality of life in these areas.  A difference of  2-3 points is a clinically meaningful difference.  A difference of 2-3 points in the total score of the Quality of Life Index has been associated with significant improvement in overall quality of life, self-image, physical symptoms, and general health in studies assessing change in quality of life.  PHQ-9: Recent Review Flowsheet Data    Depression screen [facility] 2/9 08/12/2017   Decreased Interest 0   Down, Depressed, Hopeless 1   PHQ - 2 Score 1   Altered sleeping 0   Tired, decreased energy 1   Change in appetite 0   Feeling bad or failure about yourself  0   Trouble concentrating 0   Moving slowly or fidgety/restless 0   Suicidal thoughts 0   PHQ-9 Score 2   Difficult doing work/chores Somewhat difficult     Interpretation of Total Score  Total Score Depression Severity:  1-4 = Minimal depression, 5-9 = Mild depression, 10-14 = Moderate depression, 15-19 = Moderately severe depression, 20-27 = Severe depression   Psychosocial Evaluation and Intervention: Psychosocial Evaluation - 08/18/17 1711      Psychosocial Evaluation & Interventions   Interventions  Encouraged to exercise with the program and follow exercise prescription;Stress management education    Comments  Counselor met with Mr. Vanscyoc Lortie) today and his spouse Manuela Schwartz for initial psychosocial evaluation.  He is a 72 year old who had a CABGx4 in late November.  He has a strong support system with a spouse of 23 years and (3) daughters who live locally.  Caldwell reports sleeping better lately and his appetite has returned for the most part.  He is supposed to see a specialist about his short term memory and sleep  problems in the near future, but wanted to recover  more before that occurs.  Anzel denies a history of depression or anxiety or any current symptoms.  He and his spouse agree that he is typically in a positive mood.  His primary stressors are his health; the loss of his 5 year old dog who passed recently; and the challenges of having a daughter who has bipolar disorder.  Counselor provided some information on the [place] groups for parents for support for this.  Yossef has goals to improve his breathing and return to more normal activities like play golf in the future.  Staff will follow with him throughout the course of this program.     Expected Outcomes  Makiah will benefit from consistent exercise to achieve his stated goals.  The educational and psychoeducational components of this program will help him understand and cope better with his condition and his current stressors.  Staff will follow.    Continue Psychosocial Services   Follow up required by staff       Psychosocial Re-Evaluation: Psychosocial Re-Evaluation    [place] Name 09/06/17 1740 09/13/17 1709 09/14/17 1058         Psychosocial Re-Evaluation   Current issues with  Current Stress Concerns  Current Stress Concerns  Current Stress Concerns     Comments  Ms. Nicole Kindred asked to speak with counselor today in regards to their daughter who has mental illness.  She is in need of resources for the daughter once she is discharged from the inpatient unit she is currently in.  Counselor will research these and provide several options to Ms. Nicole Kindred tomorrow.  She reports she and Mr. Marchio are attending the [facility] parent support groups and they have been extremely helpful.    Counselor follow up with Mr. Leahy today re: status of daughter in need of inpatient Mental Health facility.  He reports she is currently living back in the home with he and his spouse awaiting a place to open up in [place] for 4-6 weeks in patient treatment.  He states things are  going "okay" currently, and his daughter is not pleased with this option, but Mr/Ms Guo are hoping she will cooperate when the place is available in a few weeks.  Counselor will continue to follow.  Mr. Hofferber seemed hopeful today with options available and a plan in place.    Johns stress is currently is his adult daughter.  He does not feel it is adversely affecting him at this time     Expected Outcomes  Mr & Ms. Pridgeon will continue to attend support groups to help cope with their daughter's mental illness.  She will contact providers for further treatment for her daughter once she is discharged.  Counselor encouraged Ms. Grantz to research her options and rights to make decisions impacting her daughter.    Mr/Ms Delpilar will continue to practice good boundaries and limits with their daughter and self-care for themselves.    Short - Laurice will coninue to attend class Long - Nyshawn will maintain good control of stress     Interventions  Stress management education  Stress management education  Encouraged to attend Cardiac Rehabilitation for the exercise     Continue Psychosocial Services   -  Follow up required by staff  Follow up required by staff        Psychosocial Discharge (Final Psychosocial Re-Evaluation): Psychosocial Re-Evaluation - 09/14/17 1058      Psychosocial Re-Evaluation   Current issues with  Current Stress Concerns    Comments  Johns stress is currently is his adult daughter.  He does not feel it is adversely affecting him at this time    Expected Outcomes  Short - Lendon will coninue to attend class Long - Jovann will maintain good control of stress    Interventions  Encouraged to attend Cardiac Rehabilitation for the exercise    Continue Psychosocial Services   Follow up required by staff       Vocational Rehabilitation: Provide vocational rehab assistance to qualifying candidates.   Vocational Rehab Evaluation & Intervention: Vocational Rehab - 08/12/17 1413       Initial Vocational Rehab Evaluation & Intervention   Assessment shows need for Vocational Rehabilitation  No       Education: Education Goals: Education classes will be provided on a variety of topics geared toward better understanding of heart health and risk factor modification. Participant will state understanding/return demonstration of topics presented as noted by education test scores.  Learning Barriers/Preferences: Learning Barriers/Preferences - 08/12/17 1413      Learning Barriers/Preferences   Learning Barriers  None    Learning Preferences  None       Education Topics:  AED/CPR: - Group verbal and written instruction with the use of models to demonstrate the basic use of the AED with the basic ABC's of resuscitation.   Cardiac Rehab from 09/13/2017 in [facility] Cardiac and Pulmonary Rehab  Date  08/30/17  Educator  MA  Instruction Review Code  1- Verbalizes Understanding      General Nutrition Guidelines/Fats and Fiber: -Group instruction provided by verbal, written material, models and posters to present the general guidelines for heart healthy nutrition. Gives an explanation and review of dietary fats and fiber.   Cardiac Rehab from 09/13/2017 in [facility] Cardiac and Pulmonary Rehab  Date  08/16/17  Educator  PI  Instruction Review Code  1- Verbalizes Understanding      Controlling Sodium/Reading Food Labels: -Group verbal and written material supporting the discussion of sodium use in heart healthy nutrition. Review and explanation with models, verbal and written materials for utilization of the food label.   Cardiac Rehab from 09/13/2017 in [facility] Cardiac and Pulmonary Rehab  Date  08/23/17  Educator  PI  Instruction Review Code  1- Verbalizes Understanding      Exercise Physiology & General Exercise Guidelines: - Group verbal and written instruction with models to review the exercise physiology of the cardiovascular system and associated critical values. Provides  general exercise guidelines with specific guidelines to those with heart or lung disease.    Cardiac Rehab from 09/13/2017 in [facility] Cardiac and Pulmonary Rehab  Date  09/06/17  Educator  [facility]  Instruction Review Code  1- Verbalizes Understanding      Aerobic Exercise & Resistance Training: - Gives group verbal and written instruction on the various components of exercise. Focuses on aerobic and resistive training programs and the benefits of this training and how to safely progress through these programs..   Cardiac Rehab from 09/13/2017 in [facility] Cardiac and Pulmonary Rehab  Date  09/13/17  Educator  AS  Instruction Review Code  1- Verbalizes Understanding      Flexibility, Balance, Mind/Body Relaxation: Provides group verbal/written instruction on the benefits of flexibility and balance training, including mind/body exercise modes such as yoga, pilates and tai chi.  Demonstration and skill practice provided.   Stress and Anxiety: - Provides group verbal and written instruction about the health risks of elevated stress and causes of high stress.  Discuss  the correlation between heart/lung disease and anxiety and treatment options. Review healthy ways to manage with stress and anxiety.   Depression: - Provides group verbal and written instruction on the correlation between heart/lung disease and depressed mood, treatment options, and the stigmas associated with seeking treatment.   Anatomy & Physiology of the Heart: - Group verbal and written instruction and models provide basic cardiac anatomy and physiology, with the coronary electrical and arterial systems. Review of Valvular disease and Heart Failure   Cardiac Procedures: - Group verbal and written instruction to review commonly prescribed medications for heart disease. Reviews the medication, class of the drug, and side effects. Includes the steps to properly store meds and maintain the prescription regimen. (beta blockers and  nitrates)   Cardiac Rehab from 09/13/2017 in [facility] Cardiac and Pulmonary Rehab  Date  08/18/17  Educator  [facility]  Instruction Review Code  1- Verbalizes Understanding      Cardiac Medications I: - Group verbal and written instruction to review commonly prescribed medications for heart disease. Reviews the medication, class of the drug, and side effects. Includes the steps to properly store meds and maintain the prescription regimen.   Cardiac Medications II: -Group verbal and written instruction to review commonly prescribed medications for heart disease. Reviews the medication, class of the drug, and side effects. (all other drug classes)    Go Sex-Intimacy & Heart Disease, Get SMART - Goal Setting: - Group verbal and written instruction through game format to discuss heart disease and the return to sexual intimacy. Provides group verbal and written material to discuss and apply goal setting through the application of the S.M.A.R.T. Method.   Cardiac Rehab from 09/13/2017 in [facility] Cardiac and Pulmonary Rehab  Date  08/18/17  Educator  [facility]  Instruction Review Code  1- Verbalizes Understanding      Other Matters of the Heart: - Provides group verbal, written materials and models to describe Stable Angina and Peripheral Artery. Includes description of the disease process and treatment options available to the cardiac patient.   Exercise & Equipment Safety: - Individual verbal instruction and demonstration of equipment use and safety with use of the equipment.   Cardiac Rehab from 09/13/2017 in [facility] Cardiac and Pulmonary Rehab  Date  08/12/17  Educator  [facility]  Instruction Review Code  1- Verbalizes Understanding      Infection Prevention: - Provides verbal and written material to individual with discussion of infection control including proper hand washing and proper equipment cleaning during exercise session.   Cardiac Rehab from 09/13/2017 in [facility] Cardiac and Pulmonary Rehab  Date   08/12/17  Educator  [facility]  Instruction Review Code  1- Verbalizes Understanding      Falls Prevention: - Provides verbal and written material to individual with discussion of falls prevention and safety.   Cardiac Rehab from 09/13/2017 in [facility] Cardiac and Pulmonary Rehab  Date  08/12/17  Educator  [facility]  Instruction Review Code  1- Verbalizes Understanding      Diabetes: - Individual verbal and written instruction to review signs/symptoms of diabetes, desired ranges of glucose level fasting, after meals and with exercise. Acknowledge that pre and post exercise glucose checks will be done for 3 sessions at entry of program.   Know Your Numbers and Risk Factors: -Group verbal and written instruction about important numbers in your health.  Discussion of what are risk factors and how they play a role in the disease process.  Review of Cholesterol, Blood Pressure, Diabetes, and  BMI and the role they play in your overall health.   Sleep Hygiene: -Provides group verbal and written instruction about how sleep can affect your health.  Define sleep hygiene, discuss sleep cycles and impact of sleep habits. Review good sleep hygiene tips.    Cardiac Rehab from 09/13/2017 in [facility] Cardiac and Pulmonary Rehab  Date  08/25/17  Educator  [facility]  Instruction Review Code  1- Verbalizes Understanding      Other: -Provides group and verbal instruction on various topics (see comments)   Knowledge Questionnaire Score: Knowledge Questionnaire Score - 08/12/17 1148      Knowledge Questionnaire Score   Pre Score  26/28 correct answers reviewed with Romari       Core Components/Risk Factors/Patient Goals at Admission: Personal Goals and Risk Factors at Admission - 08/12/17 1410      Core Components/Risk Factors/Patient Goals on Admission    Weight Management  Weight Maintenance;Yes    Intervention  Weight Management: Develop a combined nutrition and exercise program designed to reach desired caloric intake,  while maintaining appropriate intake of nutrient and fiber, sodium and fats, and appropriate energy expenditure required for the weight goal.;Weight Management: Provide education and appropriate resources to help participant work on and attain dietary goals.;Weight Management/Obesity: Establish reasonable short term and long term weight goals.    Admit Weight  172 lb (78 kg)    Expected Outcomes  Short Term: Continue to assess and modify interventions until short term weight is achieved;Long Term: Adherence to nutrition and physical activity/exercise program aimed toward attainment of established weight goal;Weight Maintenance: Understanding of the daily nutrition guidelines, which includes 25-35% calories from fat, 7% or less cal from saturated fats, less than 255m cholesterol, less than 1.5gm of sodium, & 5 or more servings of fruits and vegetables daily;Understanding recommendations for meals to include 15-35% energy as protein, 25-35% energy from fat, 35-60% energy from carbohydrates, less than 2016mof dietary cholesterol, 20-35 gm of total fiber daily;Understanding of distribution of calorie intake throughout the day with the consumption of 4-5 meals/snacks    Hypertension  Yes    Intervention  Provide education on lifestyle modifcations including regular physical activity/exercise, weight management, moderate sodium restriction and increased consumption of fresh fruit, vegetables, and low fat dairy, alcohol moderation, and smoking cessation.;Monitor prescription use compliance.    Expected Outcomes  Short Term: Continued assessment and intervention until BP is < 140/9030mG in hypertensive participants. < 130/77m47m in hypertensive participants with diabetes, heart failure or chronic kidney disease.;Long Term: Maintenance of blood pressure at goal levels.    Lipids  Yes    Intervention  Provide education and support for participant on nutrition & aerobic/resistive exercise along with prescribed  medications to achieve LDL <70mg17mL >40mg.98mExpected Outcomes  Short Term: Participant states understanding of desired cholesterol values and is compliant with medications prescribed. Participant is following exercise prescription and nutrition guidelines.;Long Term: Cholesterol controlled with medications as prescribed, with individualized exercise RX and with personalized nutrition plan. Value goals: LDL < 70mg, 51m> 40 mg.       Core Components/Risk Factors/Patient Goals Review:  Goals and Risk Factor Review    Row Name 09/14/17 1051             Core Components/Risk Factors/Patient Goals Review   Personal Goals Review  Lipids;Hypertension;Stress       Review  Bocephus isJemariing all meds as directed.  He had some muscle soreness last week from overdoing  exercise - it is better and he plasn to take it easier next sessions.  He has some stress from his daughter but ihe doesnt feel its negatively impacting his health at this time.       Expected Outcomes  Short - Trentyn will continue to attend HT and take meds as directed          Core Components/Risk Factors/Patient Goals at Discharge (Final Review):  Goals and Risk Factor Review - 09/14/17 1051      Core Components/Risk Factors/Patient Goals Review   Personal Goals Review  Lipids;Hypertension;Stress    Review  Kaylib is taking all meds as directed.  He had some muscle soreness last week from overdoing exercise - it is better and he plasn to take it easier next sessions.  He has some stress from his daughter but ihe doesnt feel its negatively impacting his health at this time.    Expected Outcomes  Short - Zyheir will continue to attend HT and take meds as directed       ITP Comments: ITP Comments    Row Name 08/12/17 1404 08/18/17 0643 09/15/17 0609       ITP Comments  Med Review completed. Initial ITP created. Diagnosis can be found Care Everywhere 08/02/17  30 Day review. Continue with ITP unless directed changes per Medical Director  review.   New to program  30 day review. Continue with ITP unless directed changes per Medical Director review.        Comments:

## 2017-09-15 NOTE — Progress Notes (Signed)
Daily Session Note  Patient Details  Name: Omar Kennedy MRN: [id number] Date of Birth: 21-Feb-1946 Referring Provider:     Cardiac Rehab from 08/12/2017 in [facility] Cardiac and Pulmonary Rehab  Referring Provider  Henke      Encounter Date: 09/15/2017  Check In: Session Check In - 09/15/17 1726      Check-In   Location  [facility]-Cardiac & Pulmonary Rehab    Staff Present  Renita Papa, RN Vickki Hearing, BA, ACSM CEP, Exercise Physiologist;Carroll Enterkin, RN, BSN    Supervising physician immediately available to respond to emergencies  See telemetry face sheet for immediately available ER MD    Medication changes reported      No    Fall or balance concerns reported     No    Warm-up and Cool-down  Performed on first and last piece of equipment    Resistance Training Performed  Yes    VAD Patient?  No      Pain Assessment   Currently in Pain?  No/denies          Social History   Tobacco Use  Smoking Status Former Smoker  . Last attempt to quit: 1980  . Years since quitting: 39.1  Smokeless Tobacco Never Used    Goals Met:  Independence with exercise equipment Exercise tolerated well No report of cardiac concerns or symptoms Strength training completed today  Goals Unmet:  Not Applicable  Comments: Pt able to follow exercise prescription today without complaint.  Will continue to monitor for progression.    Dr. Emily Filbert is Medical Director for [place] and [facility].

## 2017-09-16 DIAGNOSIS — Z951 Presence of aortocoronary bypass graft: Secondary | ICD-10-CM | POA: Diagnosis not present

## 2017-09-16 NOTE — Progress Notes (Signed)
Daily Session Note  Patient Details  Name: Omar Kennedy MRN: [id number] Date of Birth: 03/12/1946 Referring Provider:     Cardiac Rehab from 08/12/2017 in [facility] Cardiac and Pulmonary Rehab  Referring Provider  Henke      Encounter Date: 09/16/2017  Check In: Session Check In - 09/16/17 1702      Check-In   Location  [facility]-Cardiac & Pulmonary Rehab    Staff Present  Earlean Shawl, BS, ACSM CEP, Exercise Physiologist;Meredith Sherryll Burger, RN BSN;Essance Gatti Flavia Shipper    Supervising physician immediately available to respond to emergencies  See telemetry face sheet for immediately available ER MD    Medication changes reported      No    Fall or balance concerns reported     No    Tobacco Cessation  No Change    Warm-up and Cool-down  Performed on first and last piece of equipment    Resistance Training Performed  Yes    VAD Patient?  No      Pain Assessment   Currently in Pain?  No/denies          Social History   Tobacco Use  Smoking Status Former Smoker  . Last attempt to quit: 1980  . Years since quitting: 39.1  Smokeless Tobacco Never Used    Goals Met:  Independence with exercise equipment Exercise tolerated well No report of cardiac concerns or symptoms Strength training completed today  Goals Unmet:  Not Applicable  Comments: Pt able to follow exercise prescription today without complaint.  Will continue to monitor for progression.   Dr. Emily Filbert is Medical Director for [place] and [facility].

## 2017-09-20 ENCOUNTER — Encounter: Payer: Medicare Other | Attending: Internal Medicine

## 2017-09-20 DIAGNOSIS — Z951 Presence of aortocoronary bypass graft: Secondary | ICD-10-CM | POA: Insufficient documentation

## 2017-09-20 NOTE — Progress Notes (Signed)
Daily Session Note  Patient Details  Name: Omar Kennedy MRN: [id number] Date of Birth: 03-20-1946 Referring Provider:     Cardiac Rehab from 08/12/2017 in [facility] Cardiac and Pulmonary Rehab  Referring Provider  Henke      Encounter Date: 09/20/2017  Check In: Session Check In - 09/20/17 1734      Check-In   Location  [facility]-Cardiac & Pulmonary Rehab    Staff Present  Renita Papa, RN Moises Blood, BS, ACSM CEP, Exercise Physiologist;Bentlie Withem Oletta Darter, [place], ACSM CEP, Exercise Physiologist;Carroll Enterkin, RN, BSN    Supervising physician immediately available to respond to emergencies  See telemetry face sheet for immediately available ER MD    Medication changes reported      No    Fall or balance concerns reported     No    Warm-up and Cool-down  Performed on first and last piece of equipment    Resistance Training Performed  Yes    VAD Patient?  No      Pain Assessment   Currently in Pain?  No/denies    Multiple Pain Sites  No          Social History   Tobacco Use  Smoking Status Former Smoker  . Last attempt to quit: 1980  . Years since quitting: 39.1  Smokeless Tobacco Never Used    Goals Met:  Independence with exercise equipment Exercise tolerated well No report of cardiac concerns or symptoms Strength training completed today  Goals Unmet:  Not Applicable  Comments: Reviewed home exercise with pt today.  Pt plans to walk for exercise.  Reviewed THR, pulse, RPE, sign and symptoms, NTG use, and when to call 911 or MD.  Also discussed weather considerations and indoor options.  Pt voiced understanding.    Dr. Emily Filbert is Medical Director for [place] and [facility].

## 2017-09-23 ENCOUNTER — Encounter: Payer: Medicare Other | Admitting: *Deleted

## 2017-09-23 DIAGNOSIS — Z951 Presence of aortocoronary bypass graft: Secondary | ICD-10-CM

## 2017-09-23 NOTE — Progress Notes (Signed)
Daily Session Note  Patient Details  Name: Omar Kennedy MRN: [id number] Date of Birth: [birth year] Referring Provider:     Cardiac Rehab from 08/12/2017 in [facility] Cardiac and Pulmonary Rehab  Referring Provider  Henke      Encounter Date: 09/23/2017  Check In: Session Check In - 09/23/17 1718      Check-In   Location  [facility]-Cardiac & Pulmonary Rehab    Staff Present  Renita Papa, RN Moises Blood, BS, ACSM CEP, Exercise Physiologist;Mary Kellie Shropshire, RN, BSN, MA    Supervising physician immediately available to respond to emergencies  See telemetry face sheet for immediately available ER MD    Medication changes reported      No    Fall or balance concerns reported     No    Warm-up and Cool-down  Performed on first and last piece of equipment    Resistance Training Performed  Yes    VAD Patient?  No      Pain Assessment   Currently in Pain?  No/denies          Social History   Tobacco Use  Smoking Status Former Smoker  . Last attempt to quit: 1980  . Years since quitting: 39.2  Smokeless Tobacco Never Used    Goals Met:  Independence with exercise equipment Exercise tolerated well No report of cardiac concerns or symptoms Strength training completed today  Goals Unmet:  Not Applicable  Comments: Pt able to follow exercise prescription today without complaint.  Will continue to monitor for progression.    Dr. Emily Filbert is Medical Director for [place] and [facility].

## 2017-09-27 ENCOUNTER — Telehealth: Payer: Self-pay

## 2017-09-27 NOTE — Telephone Encounter (Signed)
Omar Kennedy left a message he would not be here today and may miss the next few Mondays

## 2017-09-29 ENCOUNTER — Telehealth: Payer: Self-pay

## 2017-09-29 NOTE — Telephone Encounter (Signed)
Omar Kennedy called to say Omar RuizJohn will be out today and tomorrow but will be back Monday

## 2017-10-04 DIAGNOSIS — Z951 Presence of aortocoronary bypass graft: Secondary | ICD-10-CM

## 2017-10-04 NOTE — Progress Notes (Signed)
Daily Session Note  Patient Details  Name: Omar Kennedy MRN: [id number] Date of Birth: 08/09/1945 Referring Provider:     Cardiac Rehab from 08/12/2017 in [facility] Cardiac and Pulmonary Rehab  Referring Provider  Henke      Encounter Date: 10/04/2017  Check In: Session Check In - 10/04/17 1731      Check-In   Location  [facility]-Cardiac & Pulmonary Rehab    Staff Present  Meredith Craven, RN BSN;Kelly Hayes, BS, ACSM CEP, Exercise Physiologist; , BA, ACSM CEP, Exercise Physiologist;Carroll Enterkin, RN, BSN    Supervising physician immediately available to respond to emergencies  See telemetry face sheet for immediately available ER MD    Medication changes reported      No    Fall or balance concerns reported     No    Warm-up and Cool-down  Performed on first and last piece of equipment    Resistance Training Performed  Yes    VAD Patient?  No      Pain Assessment   Currently in Pain?  No/denies    Multiple Pain Sites  No          Social History   Tobacco Use  Smoking Status Former Smoker  . Last attempt to quit: 1980  . Years since quitting: 39.2  Smokeless Tobacco Never Used    Goals Met:  Independence with exercise equipment Exercise tolerated well No report of cardiac concerns or symptoms Strength training completed today  Goals Unmet:  Not Applicable  Comments: Pt able to follow exercise prescription today without complaint.  Will continue to monitor for progression.    Dr. Mark Miller is Medical Director for [facility] and [facility]. 

## 2017-10-06 ENCOUNTER — Encounter: Payer: Self-pay | Admitting: *Deleted

## 2017-10-06 ENCOUNTER — Encounter: Payer: Medicare Other | Admitting: *Deleted

## 2017-10-06 DIAGNOSIS — Z951 Presence of aortocoronary bypass graft: Secondary | ICD-10-CM

## 2017-10-06 NOTE — Progress Notes (Signed)
Daily Session Note  Patient Details  Name: Omar Kennedy MRN: [id number] Date of Birth: 1946/05/20 Referring Provider:     Cardiac Rehab from 08/12/2017 in [facility] Cardiac and Pulmonary Rehab  Referring Provider  Henke      Encounter Date: 10/06/2017  Check In: Session Check In - 10/06/17 1650      Check-In   Location  [facility]-Cardiac & Pulmonary Rehab    Staff Present  Renita Papa, RN Vickki Hearing, BA, ACSM CEP, Exercise Physiologist;Lenox Ladouceur, RN, BSN    Supervising physician immediately available to respond to emergencies  See telemetry face sheet for immediately available ER MD    Medication changes reported      No    Fall or balance concerns reported     No    Tobacco Cessation  No Change    Warm-up and Cool-down  Performed on first and last piece of equipment    Resistance Training Performed  Yes    VAD Patient?  No      Pain Assessment   Currently in Pain?  No/denies        Exercise Prescription Changes - 10/06/17 1500      Response to Exercise   Blood Pressure (Admit)  120/70    Blood Pressure (Exercise)  144/64    Blood Pressure (Exit)  124/60    Heart Rate (Admit)  55 bpm    Heart Rate (Exercise)  99 bpm    Heart Rate (Exit)  57 bpm    Rating of Perceived Exertion (Exercise)  11    Symptoms  none    Duration  Continue with 45 min of aerobic exercise without signs/symptoms of physical distress.    Intensity  THRR unchanged      Progression   Progression  Continue to progress workloads to maintain intensity without signs/symptoms of physical distress.    Average METs  4      Resistance Training   Training Prescription  Yes    Weight  4 lb    Reps  10-15      Interval Training   Interval Training  No      Recumbant Bike   Level  5    RPM  60    Watts  49    Minutes  15    METs  3.96      REL-XR   Level  5    Minutes  15    METs  3.3      Home Exercise Plan   Plans to continue exercise at  Home (comment) walk    Frequency  Add 2  additional days to program exercise sessions.    Initial Home Exercises Provided  09/20/17       Social History   Tobacco Use  Smoking Status Former Smoker  . Last attempt to quit: 1980  . Years since quitting: 39.2  Smokeless Tobacco Never Used    Goals Met:  Proper associated with RPD/PD & O2 Sat Exercise tolerated well No report of cardiac concerns or symptoms Strength training completed today  Goals Unmet:  Not Applicable  Comments:     Dr. Emily Filbert is Medical Director for [place] and [facility].

## 2017-10-11 ENCOUNTER — Encounter: Payer: Medicare Other | Admitting: *Deleted

## 2017-10-11 VITALS — Ht 70.75 in | Wt 174.0 lb

## 2017-10-11 DIAGNOSIS — Z951 Presence of aortocoronary bypass graft: Secondary | ICD-10-CM | POA: Diagnosis not present

## 2017-10-11 NOTE — Progress Notes (Signed)
Daily Session Note  Patient Details  Name: Omar Kennedy MRN: [id number] Date of Birth: 1946-06-11 Referring Provider:     Cardiac Rehab from 08/12/2017 in [facility] Cardiac and Pulmonary Rehab  Referring Provider  Henke      Encounter Date: 10/11/2017  Check In: Session Check In - 10/11/17 1749      Check-In   Location  [facility]-Cardiac & Pulmonary Rehab    Staff Present  Renita Papa, RN Moises Blood, BS, ACSM CEP, Exercise Physiologist;Amanda Oletta Darter, BA, ACSM CEP, Exercise Physiologist;Carroll Enterkin, RN, BSN    Supervising physician immediately available to respond to emergencies  See telemetry face sheet for immediately available ER MD    Medication changes reported      No    Fall or balance concerns reported     No    Warm-up and Cool-down  Performed on first and last piece of equipment    Resistance Training Performed  Yes    VAD Patient?  No      Pain Assessment   Currently in Pain?  No/denies    Multiple Pain Sites  No          Social History   Tobacco Use  Smoking Status Former Smoker  . Last attempt to quit: 1980  . Years since quitting: 39.2  Smokeless Tobacco Never Used    Goals Met:  Independence with exercise equipment Exercise tolerated well No report of cardiac concerns or symptoms Strength training completed today  Goals Unmet:  Not Applicable  Comments: Pt able to follow exercise prescription today without complaint.  Will continue to monitor for progression. [place] Name 08/12/17 1407 10/11/17 1751       6 Minute Walk   Phase  -  Discharge    Distance  1500 feet  1570 feet    Distance % Change  -  4.6 %    Distance Feet Change  -  70 ft    Walk Time  6 minutes  6 minutes    # of Rest Breaks  0  0    MPH  2.84  2.97    METS  3.99  3.47    RPE  8  11    Perceived Dyspnea   0  -    VO2 Peak  13.97  12.15    Symptoms  No  No    Resting HR  62 bpm  60 bpm    Resting BP  132/60  132/68    Resting Oxygen Saturation   100  %  -    Exercise Oxygen Saturation  during 6 min walk  99 %  99 %    Max Ex. HR  117 bpm  69 bpm    Max Ex. BP  172/72  170/80    2 Minute Post BP  136/66  -          Dr. Emily Filbert is Medical Director for [place] and [facility].

## 2017-10-13 ENCOUNTER — Encounter: Payer: Self-pay | Admitting: *Deleted

## 2017-10-13 DIAGNOSIS — Z951 Presence of aortocoronary bypass graft: Secondary | ICD-10-CM

## 2017-10-13 NOTE — Progress Notes (Signed)
Daily Session Note  Patient Details  Name: JAKEVION ARNEY MRN: [id number] Date of Birth: [birth year] Referring Provider:     Cardiac Rehab from 08/12/2017 in [facility] Cardiac and Pulmonary Rehab  Referring Provider  Henke      Encounter Date: 10/13/2017  Check In: Session Check In - 10/13/17 1744      Check-In   Location  [facility]-Cardiac & Pulmonary Rehab    Staff Present  Renita Papa, RN Vickki Hearing, BA, ACSM CEP, Exercise Physiologist;Carroll Enterkin, RN, BSN    Supervising physician immediately available to respond to emergencies  See telemetry face sheet for immediately available ER MD    Medication changes reported      No    Fall or balance concerns reported     No    Warm-up and Cool-down  Performed on first and last piece of equipment    Resistance Training Performed  Yes    VAD Patient?  No      Pain Assessment   Currently in Pain?  No/denies    Multiple Pain Sites  No          Social History   Tobacco Use  Smoking Status Former Smoker  . Last attempt to quit: 1980  . Years since quitting: 39.2  Smokeless Tobacco Never Used    Goals Met:  Independence with exercise equipment Exercise tolerated well No report of cardiac concerns or symptoms Strength training completed today  Goals Unmet:  Not Applicable  Comments: Pt able to follow exercise prescription today without complaint.  Will continue to monitor for progression.    Dr. Emily Filbert is Medical Director for [place] and [facility].

## 2017-10-13 NOTE — Progress Notes (Signed)
Cardiac Individual Treatment Plan  Patient Details  Name: Omar Kennedy MRN: [id number] Date of Birth: [birth year] Referring Provider:     Cardiac Rehab from 08/12/2017 in [facility] Cardiac and Pulmonary Rehab  Referring Provider  Henke      Initial Encounter Date:    Cardiac Rehab from 08/12/2017 in [facility] Cardiac and Pulmonary Rehab  Date  08/12/17  Referring Provider  Henke      Visit Diagnosis: S/P CABG x 4  Patient's Home Medications on Admission:  Current Outpatient Medications:  .  acetaminophen (TYLENOL) 325 MG tablet, Take by mouth., Disp: , Rfl:  .  aspirin EC 81 MG tablet, Take by mouth., Disp: , Rfl:  .  carvedilol (COREG) 12.5 MG tablet, Take 12.5 mg by mouth 2 (two) times daily with a meal., Disp: , Rfl: 11 .  fluticasone (FLONASE) 50 MCG/ACT nasal spray, Place 2 sprays into both nostrils daily., Disp: , Rfl: 11 .  furosemide (LASIX) 20 MG tablet, Take 20 mg by mouth daily., Disp: , Rfl: 0 .  HYDROmorphone (DILAUDID) 2 MG tablet, Take 2 mg by mouth every 6 (six) hours as needed. for pain, Disp: , Rfl: 0 .  KLOR-CON 10 10 MEQ tablet, Take 10 mEq by mouth daily., Disp: , Rfl: 0 .  lisinopril (PRINIVIL,ZESTRIL) 5 MG tablet, Take 5 mg by mouth daily., Disp: , Rfl: 3 .  metoprolol tartrate (LOPRESSOR) 25 MG tablet, Take 25 mg by mouth every 12 (twelve) hours., Disp: , Rfl: 2 .  nitroGLYCERIN (NITROSTAT) 0.4 MG SL tablet, PLEASE SEE ATTACHED FOR DETAILED DIRECTIONS, Disp: , Rfl: 0 .  rosuvastatin (CRESTOR) 40 MG tablet, TAKE ONE TABLET BY MOUTH AT NIGHT, Disp: , Rfl: 2 .  senna-docusate (SENOKOT-S) 8.6-50 MG tablet, Take by mouth., Disp: , Rfl:  .  sulfamethoxazole-trimethoprim (BACTRIM,SEPTRA) 400-80 MG tablet, TAKE 1 TABLET (80 MG OF TRIMETHOPRIM TOTAL) BY MOUTH 2 (TWO) TIMES DAILY, Disp: , Rfl: 0  Past Medical History: No past medical history on file.  Tobacco Use: Social History   Tobacco Use  Smoking Status Former Smoker  . Last attempt to quit: 1980  . Years since  quitting: 39.2  Smokeless Tobacco Never Used    Labs: Recent Review Flowsheet Data    There is no flowsheet data to display.       Exercise Target Goals:    Exercise Program Goal: Individual exercise prescription set using results from initial 6 min walk test and THRR while considering  patient's activity barriers and safety.   Exercise Prescription Goal: Initial exercise prescription builds to 30-45 minutes a day of aerobic activity, 2-3 days per week.  Home exercise guidelines will be given to patient during program as part of exercise prescription that the participant will acknowledge.  Activity Barriers & Risk Stratification: Activity Barriers & Cardiac Risk Stratification - 08/12/17 1415      Activity Barriers & Cardiac Risk Stratification   Activity Barriers  Incisional Pain    Cardiac Risk Stratification  Moderate       6 Minute Walk: 6 Minute Walk    Row Name 08/12/17 1407 10/11/17 1751       6 Minute Walk   Phase  -  Discharge    Distance  1500 feet  1570 feet    Distance % Change  -  4.6 %    Distance Feet Change  -  70 ft    Walk Time  6 minutes  6 minutes    # of Rest Breaks  0  0    MPH  2.84  2.97    METS  3.99  3.47    RPE  8  11    Perceived Dyspnea   0  -    VO2 Peak  13.97  12.15    Symptoms  No  No    Resting HR  62 bpm  60 bpm    Resting BP  132/60  132/68    Resting Oxygen Saturation   100 %  -    Exercise Oxygen Saturation  during 6 min walk  99 %  99 %    Max Ex. HR  117 bpm  69 bpm    Max Ex. BP  172/72  170/80    2 Minute Post BP  136/66  -       Oxygen Initial Assessment:   Oxygen Re-Evaluation:   Oxygen Discharge (Final Oxygen Re-Evaluation):   Initial Exercise Prescription: Initial Exercise Prescription - 08/12/17 1400      Date of Initial Exercise RX and Referring Provider   Date  08/12/17    Referring Provider  Henke      Treadmill   MPH  2.8    Grade  2    Minutes  15    METs  4      Recumbant Bike   Level   4    RPM  60    Watts  50    Minutes  15    METs  4      T5 Nustep   Level  3    SPM  80    Minutes  15    METs  4      Prescription Details   Frequency (times per week)  3    Duration  Progress to 45 minutes of aerobic exercise without signs/symptoms of physical distress      Intensity   THRR 40-80% of Max Heartrate  96-131    Ratings of Perceived Exertion  11-13    Perceived Dyspnea  0-4      Resistance Training   Training Prescription  Yes    Weight  4 lb    Reps  10-15       Perform Capillary Blood Glucose checks as needed.  Exercise Prescription Changes: Exercise Prescription Changes    Row Name 08/12/17 1400 08/25/17 1400 09/07/17 1600 09/20/17 1700 09/22/17 1500     Response to Exercise   Blood Pressure (Admit)  132/60  126/82  -  -  104/56   Blood Pressure (Exercise)  172/72  156/84  142/84  -  142/72   Blood Pressure (Exit)  136/66  104/62  142/80  -  110/68   Heart Rate (Admit)  62 bpm  64 bpm  76 bpm  -  69 bpm   Heart Rate (Exercise)  117 bpm  110 bpm  105 bpm  -  101 bpm   Heart Rate (Exit)  61 bpm  57 bpm  79 bpm  -  64 bpm   Oxygen Saturation (Admit)  100 %  -  -  -  -   Oxygen Saturation (Exit)  99 %  -  -  -  -   Rating of Perceived Exertion (Exercise)  _0 -  11   Symptoms  -  none  none  -  none   Duration  -  Progress to 45 minutes of aerobic exercise without signs/symptoms of physical  distress  Continue with 45 min of aerobic exercise without signs/symptoms of physical distress.  -  Continue with 45 min of aerobic exercise without signs/symptoms of physical distress.   Intensity  -  THRR unchanged  -  -  THRR unchanged     Progression   Progression  -  Continue to progress workloads to maintain intensity without signs/symptoms of physical distress.  Continue to progress workloads to maintain intensity without signs/symptoms of physical distress.  -  Continue to progress workloads to maintain intensity without signs/symptoms of physical  distress.   Average METs  -  3.15  3.1  -  4.19     Resistance Training   Training Prescription  -  Yes  Yes  -  Yes   Weight  -  4 lb  4 lb  -  4 lb   Reps  -  10-15  10-15  -  10-15     Interval Training   Interval Training  -  No  No  -  No     Treadmill   MPH  -  -  2.9  -  2.9   Grade  -  -  2.5  -  2.5   Minutes  -  -  15  -  15   METs  -  -  4.42  -  4.42     Recumbant Bike   Level  -  4  -  -  5   RPM  -  60  -  -  60   Watts  -  22  -  -  49   Minutes  -  15  -  -  15   METs  -  2.8  -  -  3.96     REL-XR   Level  -  3  3  -  -   Minutes  -  15  15  -  -   METs  -  3.95  1.7  -  -     Home Exercise Plan   Plans to continue exercise at  -  -  -  Home (comment) walk  Home (comment) walk   Frequency  -  -  -  Add 2 additional days to program exercise sessions.  Add 2 additional days to program exercise sessions.   Initial Home Exercises Provided  -  -  -  09/20/17  09/20/17   Row Name 10/06/17 1500             Response to Exercise   Blood Pressure (Admit)  120/70       Blood Pressure (Exercise)  144/64       Blood Pressure (Exit)  124/60       Heart Rate (Admit)  55 bpm       Heart Rate (Exercise)  99 bpm       Heart Rate (Exit)  57 bpm       Rating of Perceived Exertion (Exercise)  11       Symptoms  none       Duration  Continue with 45 min of aerobic exercise without signs/symptoms of physical distress.       Intensity  THRR unchanged         Progression   Progression  Continue to progress workloads to maintain intensity without signs/symptoms of physical distress.       Average METs  4  Resistance Training   Training Prescription  Yes       Weight  4 lb       Reps  10-15         Interval Training   Interval Training  No         Recumbant Bike   Level  5       RPM  60       Watts  49       Minutes  15       METs  3.96         REL-XR   Level  5       Minutes  15       METs  3.3         Home Exercise Plan   Plans to continue  exercise at  Home (comment) walk       Frequency  Add 2 additional days to program exercise sessions.       Initial Home Exercises Provided  09/20/17          Exercise Comments: Exercise Comments    Row Name 08/16/17 1639 09/20/17 1736         Exercise Comments   First full day of exercise!  Patient was oriented to gym and equipment including functions, settings, policies, and procedures.  Patient's individual exercise prescription and treatment plan were reviewed.  All starting workloads were established based on the results of the 6 minute walk test done at initial orientation visit.  The plan for exercise progression was also introduced and progression will be customized based on patient's performance and goals.  Reviewed home exercise with pt today.  Pt plans to walk for exercise.  Reviewed THR, pulse, RPE, sign and symptoms, NTG use, and when to call 911 or MD.  Also discussed weather considerations and indoor options.  Pt voiced understanding.         Exercise Goals and Review: Exercise Goals    Row Name 08/12/17 1406             Exercise Goals   Increase Physical Activity  Yes       Intervention  Provide advice, education, support and counseling about physical activity/exercise needs.;Develop an individualized exercise prescription for aerobic and resistive training based on initial evaluation findings, risk stratification, comorbidities and participant's personal goals.       Expected Outcomes  Short Term: Attend rehab on a regular basis to increase amount of physical activity.;Long Term: Add in home exercise to make exercise part of routine and to increase amount of physical activity.;Long Term: Exercising regularly at least 3-5 days a week.       Increase Strength and Stamina  Yes       Intervention  Provide advice, education, support and counseling about physical activity/exercise needs.;Develop an individualized exercise prescription for aerobic and resistive training based  on initial evaluation findings, risk stratification, comorbidities and participant's personal goals.       Expected Outcomes  Short Term: Increase workloads from initial exercise prescription for resistance, speed, and METs.;Short Term: Perform resistance training exercises routinely during rehab and add in resistance training at home;Long Term: Improve cardiorespiratory fitness, muscular endurance and strength as measured by increased METs and functional capacity (6MWT)       Able to understand and use rate of perceived exertion (RPE) scale  Yes       Intervention  Provide education and explanation on how to use RPE scale  Expected Outcomes  Short Term: Able to use RPE daily in rehab to express subjective intensity level;Long Term:  Able to use RPE to guide intensity level when exercising independently       Able to understand and use Dyspnea scale  Yes       Intervention  Provide education and explanation on how to use Dyspnea scale       Expected Outcomes  Short Term: Able to use Dyspnea scale daily in rehab to express subjective sense of shortness of breath during exertion;Long Term: Able to use Dyspnea scale to guide intensity level when exercising independently       Knowledge and understanding of Target Heart Rate Range (THRR)  Yes       Intervention  Provide education and explanation of THRR including how the numbers were predicted and where they are located for reference       Expected Outcomes  Short Term: Able to state/look up THRR;Long Term: Able to use THRR to govern intensity when exercising independently;Short Term: Able to use daily as guideline for intensity in rehab       Able to check pulse independently  Yes       Intervention  Provide education and demonstration on how to check pulse in carotid and radial arteries.;Review the importance of being able to check your own pulse for safety during independent exercise       Expected Outcomes  Short Term: Able to explain why pulse  checking is important during independent exercise;Long Term: Able to check pulse independently and accurately       Understanding of Exercise Prescription  Yes       Intervention  Provide education, explanation, and written materials on patient's individual exercise prescription       Expected Outcomes  Short Term: Able to explain program exercise prescription;Long Term: Able to explain home exercise prescription to exercise independently          Exercise Goals Re-Evaluation : Exercise Goals Re-Evaluation    Row Name 08/16/17 1640 08/25/17 1415 09/07/17 1604 09/20/17 1736 10/06/17 1532     Exercise Goal Re-Evaluation   Exercise Goals Review  Increase Physical Activity;Able to understand and use rate of perceived exertion (RPE) scale;Knowledge and understanding of Target Heart Rate Range (THRR);Understanding of Exercise Prescription;Increase Strength and Stamina  Increase Physical Activity;Able to understand and use rate of perceived exertion (RPE) scale;Increase Strength and Stamina  Increase Physical Activity;Able to understand and use rate of perceived exertion (RPE) scale;Increase Strength and Stamina  Increase Physical Activity;Increase Strength and Stamina;Able to understand and use rate of perceived exertion (RPE) scale;Knowledge and understanding of Target Heart Rate Range (THRR);Able to check pulse independently;Understanding of Exercise Prescription  Able to understand and use rate of perceived exertion (RPE) scale;Increase Physical Activity;Increase Strength and Stamina   Comments  Reviewed RPE scale, THR and program prescription with pt today.  Pt voiced understanding and was given a copy of goals to take home.   Santez is tolerating exercise well.  Staff will continue to monitor progress.  Trayquan is progressing well and has moved up his speed and incline on TM.  Staff will continue to monitor  Reviewed home exercise with pt today.  Pt plans to walk for exercise.  Reviewed THR, pulse, RPE, sign  and symptoms, NTG use, and when to call 911 or MD.  Also discussed weather considerations and indoor options.  Pt voiced understanding.  Jireh is progressing well with exercise.  Staff will encourage intervals to  increase intensity.   Expected Outcomes  Short: Use RPE daily to regulate intensity.  Long: Follow program prescription in THR.  Short - Graceson will attend class regularly Long - Baby will improve MET level  Short - Reynol will attend class regularly, staff will review home exercise Long - Pritesh will improve overall MET level  Short - Cadyn will add walking for 20-30 min in addition to program sessions Long - Everest will maintain exercise on his own  Short - Tyshawn will continue to attend regularly Long - Keian will maintain fitness on his own      Discharge Exercise Prescription (Final Exercise Prescription Changes): Exercise Prescription Changes - 10/06/17 1500      Response to Exercise   Blood Pressure (Admit)  120/70    Blood Pressure (Exercise)  144/64    Blood Pressure (Exit)  124/60    Heart Rate (Admit)  55 bpm    Heart Rate (Exercise)  99 bpm    Heart Rate (Exit)  57 bpm    Rating of Perceived Exertion (Exercise)  11    Symptoms  none    Duration  Continue with 45 min of aerobic exercise without signs/symptoms of physical distress.    Intensity  THRR unchanged      Progression   Progression  Continue to progress workloads to maintain intensity without signs/symptoms of physical distress.    Average METs  4      Resistance Training   Training Prescription  Yes    Weight  4 lb    Reps  10-15      Interval Training   Interval Training  No      Recumbant Bike   Level  5    RPM  60    Watts  49    Minutes  15    METs  3.96      REL-XR   Level  5    Minutes  15    METs  3.3      Home Exercise Plan   Plans to continue exercise at  Home (comment) walk    Frequency  Add 2 additional days to program exercise sessions.    Initial Home Exercises Provided  09/20/17        Nutrition:  Target Goals: Understanding of nutrition guidelines, daily intake of sodium <1554m, cholesterol <2039m calories 30% from fat and 7% or less from saturated fats, daily to have 5 or more servings of fruits and vegetables.  Biometrics: Pre Biometrics - 08/12/17 1405      Pre Biometrics   Height  5' 10.75" (1.797 m)    Weight  171 lb 12.8 oz (77.9 kg)    Waist Circumference  36 inches    Hip Circumference  40 inches    Waist to Hip Ratio  0.9 %    BMI (Calculated)  24.13    Single Leg Stand  10.5 seconds      Post Biometrics - 10/11/17 1754       Post  Biometrics   Height  5' 10.75" (1.797 m)    Weight  174 lb (78.9 kg)    Waist Circumference  36 inches    Hip Circumference  39 inches    Waist to Hip Ratio  0.92 %    BMI (Calculated)  24.44    Single Leg Stand  17.67 seconds       Nutrition Therapy Plan and Nutrition Goals: Nutrition Therapy & Goals - 08/12/17 1412  Intervention Plan   Intervention  Prescribe, educate and counsel regarding individualized specific dietary modifications aiming towards targeted core components such as weight, hypertension, lipid management, diabetes, heart failure and other comorbidities.;Nutrition handout(s) given to patient.    Expected Outcomes  Short Term Goal: Understand basic principles of dietary content, such as calories, fat, sodium, cholesterol and nutrients.;Short Term Goal: A plan has been developed with personal nutrition goals set during dietitian appointment.;Long Term Goal: Adherence to prescribed nutrition plan.       Nutrition Assessments: Nutrition Assessments - 08/12/17 1149      MEDFICTS Scores   Pre Score  32       Nutrition Goals Re-Evaluation: Nutrition Goals Re-Evaluation    [place] Name 09/14/17 1057 10/06/17 1413           Goals   Comment  Johns wife has already adjusted their diet to lower sodium, no red meat or fried foods.  They want to schedule to meet together with the RD  Juliocesar said  his wife is trying to get an appt with the dietician for both of them to go. Yarnell said she has the phone number. Shanon feels they are already eating healthy.,      Expected Outcome  Short - Muad will continue to eat a heart healthy diet and meet with the RD Long - Brandin will maintain heart healthy diet   Maintain heart healthy diet.         Nutrition Goals Discharge (Final Nutrition Goals Re-Evaluation): Nutrition Goals Re-Evaluation - 10/06/17 1413      Goals   Comment  Istvan said his wife is trying to get an appt with the dietician for both of them to go. Jovonte said she has the phone number. Laurin feels they are already eating healthy.,    Expected Outcome  Maintain heart healthy diet.       Psychosocial: Target Goals: Acknowledge presence or absence of significant depression and/or stress, maximize coping skills, provide positive support system. Participant is able to verbalize types and ability to use techniques and skills needed for reducing stress and depression.   Initial Review & Psychosocial Screening: Initial Psych Review & Screening - 08/12/17 1412      Initial Review   Current issues with  Current Sleep Concerns      Family Dynamics   Good Support System?  Yes      Screening Interventions   Interventions  Encouraged to exercise;Program counselor consult;To provide support and resources with identified psychosocial needs    Expected Outcomes  Short Term goal: Utilizing psychosocial counselor, staff and physician to assist with identification of specific Stressors or current issues interfering with healing process. Setting desired goal for each stressor or current issue identified.;Long Term Goal: Stressors or current issues are controlled or eliminated.;Short Term goal: Identification and review with participant of any Quality of Life or Depression concerns found by scoring the questionnaire.;Long Term goal: The participant improves quality of Life and PHQ9 Scores as seen by post  scores and/or verbalization of changes       Quality of Life Scores:  Quality of Life - 08/12/17 1148      Quality of Life Scores   Health/Function Pre  21 %    Socioeconomic Pre  20.38 %    Psych/Spiritual Pre  20.71 %    Family Pre  21 %    GLOBAL Pre  20.8 %      Scores of 19 and below usually indicate a poorer quality  of life in these areas.  A difference of  2-3 points is a clinically meaningful difference.  A difference of 2-3 points in the total score of the Quality of Life Index has been associated with significant improvement in overall quality of life, self-image, physical symptoms, and general health in studies assessing change in quality of life.  PHQ-9: Recent Review Flowsheet Data    Depression screen [facility] 2/9 08/12/2017   Decreased Interest 0   Down, Depressed, Hopeless 1   PHQ - 2 Score 1   Altered sleeping 0   Tired, decreased energy 1   Change in appetite 0   Feeling bad or failure about yourself  0   Trouble concentrating 0   Moving slowly or fidgety/restless 0   Suicidal thoughts 0   PHQ-9 Score 2   Difficult doing work/chores Somewhat difficult     Interpretation of Total Score  Total Score Depression Severity:  1-4 = Minimal depression, 5-9 = Mild depression, 10-14 = Moderate depression, 15-19 = Moderately severe depression, 20-27 = Severe depression   Psychosocial Evaluation and Intervention: Psychosocial Evaluation - 08/18/17 1711      Psychosocial Evaluation & Interventions   Interventions  Encouraged to exercise with the program and follow exercise prescription;Stress management education    Comments  Counselor met with Mr. Sass Mounger) today and his spouse Manuela Schwartz for initial psychosocial evaluation.  He is a 72 year old who had a CABGx4 in late November.  He has a strong support system with a spouse of 44 years and (3) daughters who live locally.  Yarden reports sleeping better lately and his appetite has returned for the most part.  He is supposed  to see a specialist about his short term memory and sleep problems in the near future, but wanted to recover more before that occurs.  Romar denies a history of depression or anxiety or any current symptoms.  He and his spouse agree that he is typically in a positive mood.  His primary stressors are his health; the loss of his 45 year old dog who passed recently; and the challenges of having a daughter who has bipolar disorder.  Counselor provided some information on the [place] groups for parents for support for this.  Avery has goals to improve his breathing and return to more normal activities like play golf in the future.  Staff will follow with him throughout the course of this program.     Expected Outcomes  Tracker will benefit from consistent exercise to achieve his stated goals.  The educational and psychoeducational components of this program will help him understand and cope better with his condition and his current stressors.  Staff will follow.    Continue Psychosocial Services   Follow up required by staff       Psychosocial Re-Evaluation: Psychosocial Re-Evaluation    [place] Name 09/06/17 1740 09/13/17 1709 09/14/17 1058 10/04/17 1713 10/06/17 1511     Psychosocial Re-Evaluation   Current issues with  Current Stress Concerns  Current Stress Concerns  Current Stress Concerns  Current Stress Concerns  -   Comments  Ms. Nicole Kindred asked to speak with counselor today in regards to their daughter who has mental illness.  She is in need of resources for the daughter once she is discharged from the inpatient unit she is currently in.  Counselor will research these and provide several options to Ms. Nicole Kindred tomorrow.  She reports she and Mr. Yerger are attending the [facility] parent support groups and  they have been extremely helpful.    Counselor follow up with Mr. Elms today re: status of daughter in need of inpatient Mental Health facility.  He reports she is currently living back in the home with he and his  spouse awaiting a place to open up in [place] for 4-6 weeks in patient treatment.  He states things are going "okay" currently, and his daughter is not pleased with this option, but Mr/Ms Brumley are hoping she will cooperate when the place is available in a few weeks.  Counselor will continue to follow.  Mr. Menges seemed hopeful today with options available and a plan in place.    Johns stress is currently is his adult daughter.  He does not feel it is adversely affecting him at this time  Counselor met with [place]'s spouse today to discuss the status with their adult daughter.  She is finally in an intensive program for her dual diagnosis and this is relieving for them both. They just returned to class due to helping to manage their daughter's transition into care.  Counselor commended Ms. Burbano for she and her husband making tough united decisions re: the care of their daughter.    Emileo said the Cardiac Rehab program has really helped him since he would not go and exercise on his own. Nolan said he and his wife went to the support group that [employer] ment[employer] and they found out about a place in [place] that helped their daughter who is bipolar. Reymond said that helped his stress level.   Expected Outcomes  Mr & Ms. Woolbright will continue to attend support groups to help cope with their daughter's mental illness.  She will contact providers for further treatment for her daughter once she is discharged.  Counselor encouraged Ms. Romanoff to research her options and rights to make decisions impacting her daughter.    Mr/Ms Tulloch will continue to practice good boundaries and limits with their daughter and self-care for themselves.    Short - Imir will coninue to attend class Long - Justis will maintain good control of stress  Short - Everson and his wife will continue to agree on their interventions and interactions re: their daughter's care.  Long - Cormick will exercise consistently to help with stress and heart  health.  Long term-Secundino will use his Silver Sneakers and exercise with his wife to help decrease his stress levels. Stanton said his dog died in July 15, 2023 around the time that he had open heart surgery and that his surgery was a surprise but he has dealt with it and will cont to deal with it and improve his heart health.    Interventions  Stress management education  Stress management education  Encouraged to attend Cardiac Rehabilitation for the exercise  Relaxation education;Stress management education  Encouraged to attend Cardiac Rehabilitation for the exercise   Continue Psychosocial Services   -  Follow up required by staff  Follow up required by staff  -  -     Initial Review   Source of Stress Concerns  -  -  -  -  Family      Psychosocial Discharge (Final Psychosocial Re-Evaluation): Psychosocial Re-Evaluation - 10/06/17 1511      Psychosocial Re-Evaluation   Comments  Abubakar said the Cardiac Rehab program has really helped him since he would not go and exercise on his own. Tashan said he and his wife went to the support group that [employer] ment[employer] and they found  out about a place in [place] that helped their daughter who is bipolar. Baylen said that helped his stress level.    Expected Outcomes  Long term-Brees will use his Silver Sneakers and exercise with his wife to help decrease his stress levels. Edith said his dog died in Jul 24, 2023 around the time that he had open heart surgery and that his surgery was a surprise but he has dealt with it and will cont to deal with it and improve his heart health.     Interventions  Encouraged to attend Cardiac Rehabilitation for the exercise      Initial Review   Source of Stress Concerns  Family       Vocational Rehabilitation: Provide vocational rehab assistance to qualifying candidates.   Vocational Rehab Evaluation & Intervention: Vocational Rehab - 08/12/17 1413      Initial Vocational Rehab Evaluation & Intervention   Assessment shows  need for Vocational Rehabilitation  No       Education: Education Goals: Education classes will be provided on a variety of topics geared toward better understanding of heart health and risk factor modification. Participant will state understanding/return demonstration of topics presented as noted by education test scores.  Learning Barriers/Preferences: Learning Barriers/Preferences - 08/12/17 1413      Learning Barriers/Preferences   Learning Barriers  None    Learning Preferences  None       Education Topics:  AED/CPR: - Group verbal and written instruction with the use of models to demonstrate the basic use of the AED with the basic ABC's of resuscitation.   Cardiac Rehab from 10/11/2017 in [facility] Cardiac and Pulmonary Rehab  Date  08/30/17  Educator  MA  Instruction Review Code  1- Verbalizes Understanding      General Nutrition Guidelines/Fats and Fiber: -Group instruction provided by verbal, written material, models and posters to present the general guidelines for heart healthy nutrition. Gives an explanation and review of dietary fats and fiber.   Cardiac Rehab from 10/11/2017 in [facility] Cardiac and Pulmonary Rehab  Date  10/11/17  Educator  PI  Instruction Review Code  1- Verbalizes Understanding      Controlling Sodium/Reading Food Labels: -Group verbal and written material supporting the discussion of sodium use in heart healthy nutrition. Review and explanation with models, verbal and written materials for utilization of the food label.   Cardiac Rehab from 10/11/2017 in [facility] Cardiac and Pulmonary Rehab  Date  08/23/17  Educator  PI  Instruction Review Code  1- Verbalizes Understanding      Exercise Physiology & General Exercise Guidelines: - Group verbal and written instruction with models to review the exercise physiology of the cardiovascular system and associated critical values. Provides general exercise guidelines with specific guidelines to those with heart  or lung disease.    Cardiac Rehab from 10/11/2017 in [facility] Cardiac and Pulmonary Rehab  Date  09/06/17  Educator  [facility]  Instruction Review Code  1- Verbalizes Understanding      Aerobic Exercise & Resistance Training: - Gives group verbal and written instruction on the various components of exercise. Focuses on aerobic and resistive training programs and the benefits of this training and how to safely progress through these programs..   Cardiac Rehab from 10/11/2017 in [facility] Cardiac and Pulmonary Rehab  Date  09/13/17  Educator  AS  Instruction Review Code  1- Verbalizes Understanding      Flexibility, Balance, Mind/Body Relaxation: Provides group verbal/written instruction on the benefits of flexibility and balance  training, including mind/body exercise modes such as yoga, pilates and tai chi.  Demonstration and skill practice provided.   Cardiac Rehab from 10/11/2017 in [facility] Cardiac and Pulmonary Rehab  Date  09/15/17  Educator  AS  Instruction Review Code  1- Verbalizes Understanding      Stress and Anxiety: - Provides group verbal and written instruction about the health risks of elevated stress and causes of high stress.  Discuss the correlation between heart/lung disease and anxiety and treatment options. Review healthy ways to manage with stress and anxiety.   Depression: - Provides group verbal and written instruction on the correlation between heart/lung disease and depressed mood, treatment options, and the stigmas associated with seeking treatment.   Anatomy & Physiology of the Heart: - Group verbal and written instruction and models provide basic cardiac anatomy and physiology, with the coronary electrical and arterial systems. Review of Valvular disease and Heart Failure   Cardiac Rehab from 10/11/2017 in [facility] Cardiac and Pulmonary Rehab  Date  10/04/17  Educator  CE  Instruction Review Code  1- Verbalizes Understanding      Cardiac Procedures: - Group verbal and  written instruction to review commonly prescribed medications for heart disease. Reviews the medication, class of the drug, and side effects. Includes the steps to properly store meds and maintain the prescription regimen. (beta blockers and nitrates)   Cardiac Rehab from 10/11/2017 in [facility] Cardiac and Pulmonary Rehab  Date  08/18/17  Educator  [facility]  Instruction Review Code  1- Verbalizes Understanding      Cardiac Medications I: - Group verbal and written instruction to review commonly prescribed medications for heart disease. Reviews the medication, class of the drug, and side effects. Includes the steps to properly store meds and maintain the prescription regimen.   Cardiac Medications II: -Group verbal and written instruction to review commonly prescribed medications for heart disease. Reviews the medication, class of the drug, and side effects. (all other drug classes)   Cardiac Rehab from 10/11/2017 in [facility] Cardiac and Pulmonary Rehab  Date  09/20/17  Educator  CE  Instruction Review Code  1- Verbalizes Understanding       Go Sex-Intimacy & Heart Disease, Get SMART - Goal Setting: - Group verbal and written instruction through game format to discuss heart disease and the return to sexual intimacy. Provides group verbal and written material to discuss and apply goal setting through the application of the S.M.A.R.T. Method.   Cardiac Rehab from 10/11/2017 in [facility] Cardiac and Pulmonary Rehab  Date  08/18/17  Educator  [facility]  Instruction Review Code  1- Verbalizes Understanding      Other Matters of the Heart: - Provides group verbal, written materials and models to describe Stable Angina and Peripheral Artery. Includes description of the disease process and treatment options available to the cardiac patient.   Exercise & Equipment Safety: - Individual verbal instruction and demonstration of equipment use and safety with use of the equipment.   Cardiac Rehab from 10/11/2017 in [facility]  Cardiac and Pulmonary Rehab  Date  08/12/17  Educator  [facility]  Instruction Review Code  1- Verbalizes Understanding      Infection Prevention: - Provides verbal and written material to individual with discussion of infection control including proper hand washing and proper equipment cleaning during exercise session.   Cardiac Rehab from 10/11/2017 in [facility] Cardiac and Pulmonary Rehab  Date  08/12/17  Educator  [facility]  Instruction Review Code  1- Verbalizes Understanding  Falls Prevention: - Provides verbal and written material to individual with discussion of falls prevention and safety.   Cardiac Rehab from 10/11/2017 in [facility] Cardiac and Pulmonary Rehab  Date  08/12/17  Educator  [facility]  Instruction Review Code  1- Verbalizes Understanding      Diabetes: - Individual verbal and written instruction to review signs/symptoms of diabetes, desired ranges of glucose level fasting, after meals and with exercise. Acknowledge that pre and post exercise glucose checks will be done for 3 sessions at entry of program.   Know Your Numbers and Risk Factors: -Group verbal and written instruction about important numbers in your health.  Discussion of what are risk factors and how they play a role in the disease process.  Review of Cholesterol, Blood Pressure, Diabetes, and BMI and the role they play in your overall health.   Cardiac Rehab from 10/11/2017 in [facility] Cardiac and Pulmonary Rehab  Date  09/20/17  Educator  CE  Instruction Review Code  1- Verbalizes Understanding      Sleep Hygiene: -Provides group verbal and written instruction about how sleep can affect your health.  Define sleep hygiene, discuss sleep cycles and impact of sleep habits. Review good sleep hygiene tips.    Cardiac Rehab from 10/11/2017 in [facility] Cardiac and Pulmonary Rehab  Date  10/06/17  Educator  [facility]  Instruction Review Code  1- Verbalizes Understanding      Other: -Provides group and verbal instruction on various topics  (see comments)   Knowledge Questionnaire Score: Knowledge Questionnaire Score - 08/12/17 1148      Knowledge Questionnaire Score   Pre Score  26/28 correct answers reviewed with Jennifer       Core Components/Risk Factors/Patient Goals at Admission: Personal Goals and Risk Factors at Admission - 08/12/17 1410      Core Components/Risk Factors/Patient Goals on Admission    Weight Management  Weight Maintenance;Yes    Intervention  Weight Management: Develop a combined nutrition and exercise program designed to reach desired caloric intake, while maintaining appropriate intake of nutrient and fiber, sodium and fats, and appropriate energy expenditure required for the weight goal.;Weight Management: Provide education and appropriate resources to help participant work on and attain dietary goals.;Weight Management/Obesity: Establish reasonable short term and long term weight goals.    Admit Weight  172 lb (78 kg)    Expected Outcomes  Short Term: Continue to assess and modify interventions until short term weight is achieved;Long Term: Adherence to nutrition and physical activity/exercise program aimed toward attainment of established weight goal;Weight Maintenance: Understanding of the daily nutrition guidelines, which includes 25-35% calories from fat, 7% or less cal from saturated fats, less than 213m cholesterol, less than 1.5gm of sodium, & 5 or more servings of fruits and vegetables daily;Understanding recommendations for meals to include 15-35% energy as protein, 25-35% energy from fat, 35-60% energy from carbohydrates, less than 2044mof dietary cholesterol, 20-35 gm of total fiber daily;Understanding of distribution of calorie intake throughout the day with the consumption of 4-5 meals/snacks    Hypertension  Yes    Intervention  Provide education on lifestyle modifcations including regular physical activity/exercise, weight management, moderate sodium restriction and increased consumption of  fresh fruit, vegetables, and low fat dairy, alcohol moderation, and smoking cessation.;Monitor prescription use compliance.    Expected Outcomes  Short Term: Continued assessment and intervention until BP is < 140/9033mG in hypertensive participants. < 130/86m70m in hypertensive participants with diabetes, heart failure or chronic kidney disease.;Long Term:  Maintenance of blood pressure at goal levels.    Lipids  Yes    Intervention  Provide education and support for participant on nutrition & aerobic/resistive exercise along with prescribed medications to achieve LDL <54m, HDL >423m    Expected Outcomes  Short Term: Participant states understanding of desired cholesterol values and is compliant with medications prescribed. Participant is following exercise prescription and nutrition guidelines.;Long Term: Cholesterol controlled with medications as prescribed, with individualized exercise RX and with personalized nutrition plan. Value goals: LDL < 7033mHDL > 40 mg.       Core Components/Risk Factors/Patient Goals Review:  Goals and Risk Factor Review    Row Name 09/14/17 1051 10/06/17 1509 10/06/17 1513         Core Components/Risk Factors/Patient Goals Review   Personal Goals Review  Lipids;Hypertension;Stress  -  Weight Management/Obesity;Lipids     Review  JohDawit taking all meds as directed.  He had some muscle soreness last week from overdoing exercise - it is better and he plasn to take it easier next sessions.  He has some stress from his daughter but ihe doesnt feel its negatively impacting his health at this time.  Josua said the Cardiac Rehab program has really helped him since he would not go and exercise on his own. Zarion said he and his wife went to the support group that [employer] and they found out about [place] who is [place]. Treavor said that helped his stress level.   Torres's weight has been stable. His blood pressure has been  very good and he eats healthy so he expects his lipid blood work to be fine.      Expected Outcomes  Short - JohKeishawnll continue to attend HT and take meds as directed  [place] his goal when he graduates from [employer] to exercise using his Silver Sneakers benefit.   Heart healthy living with his wife and exercise with her using his Silver Sneakers benefit.         Core Components/Risk Factors/Patient Goals at Discharge (Final Review):  Goals and Risk Factor Review - 10/06/17 1513      Core Components/Risk Factors/Patient Goals Review   Personal Goals Review  Weight Management/Obesity;Lipids    Review  Johneric's weight has been stable. His blood pressure has been very good and he eats healthy so he expects his lipid blood work to be fine.     Expected Outcomes  Heart healthy living with his wife and exercise with her using his Silver Sneakers benefit.        ITP Comments: ITP Comments    Row Name 08/12/17 1404 08/18/17 0643 09/15/17 0609 10/13/17 0653     ITP Comments  Med Review completed. Initial ITP created. Diagnosis can be found Care Everywhere 08/02/17  30 Day review. Continue with ITP unless directed changes per Medical Director review.   New to program  30 day review. Continue with ITP unless directed changes per Medical Director review.  30 Day review. Continue with ITP unless directed changes per Medical Director review.         Comments:

## 2017-10-14 DIAGNOSIS — Z951 Presence of aortocoronary bypass graft: Secondary | ICD-10-CM | POA: Diagnosis not present

## 2017-10-14 NOTE — Progress Notes (Signed)
Cardiac Individual Treatment Plan  Patient Details  Name: Omar Kennedy MRN: [id number] Date of Birth: [birth year] Referring Provider:     Cardiac Rehab from 08/12/2017 in [facility] Cardiac and Pulmonary Rehab  Referring Provider  Henke      Initial Encounter Date:    Cardiac Rehab from 08/12/2017 in [facility] Cardiac and Pulmonary Rehab  Date  08/12/17  Referring Provider  Henke      Visit Diagnosis: S/P CABG x 4  Patient's Home Medications on Admission:  Current Outpatient Medications:  .  acetaminophen (TYLENOL) 325 MG tablet, Take by mouth., Disp: , Rfl:  .  aspirin EC 81 MG tablet, Take by mouth., Disp: , Rfl:  .  carvedilol (COREG) 12.5 MG tablet, Take 12.5 mg by mouth 2 (two) times daily with a meal., Disp: , Rfl: 11 .  fluticasone (FLONASE) 50 MCG/ACT nasal spray, Place 2 sprays into both nostrils daily., Disp: , Rfl: 11 .  furosemide (LASIX) 20 MG tablet, Take 20 mg by mouth daily., Disp: , Rfl: 0 .  HYDROmorphone (DILAUDID) 2 MG tablet, Take 2 mg by mouth every 6 (six) hours as needed. for pain, Disp: , Rfl: 0 .  KLOR-CON 10 10 MEQ tablet, Take 10 mEq by mouth daily., Disp: , Rfl: 0 .  lisinopril (PRINIVIL,ZESTRIL) 5 MG tablet, Take 5 mg by mouth daily., Disp: , Rfl: 3 .  metoprolol tartrate (LOPRESSOR) 25 MG tablet, Take 25 mg by mouth every 12 (twelve) hours., Disp: , Rfl: 2 .  nitroGLYCERIN (NITROSTAT) 0.4 MG SL tablet, PLEASE SEE ATTACHED FOR DETAILED DIRECTIONS, Disp: , Rfl: 0 .  rosuvastatin (CRESTOR) 40 MG tablet, TAKE ONE TABLET BY MOUTH AT NIGHT, Disp: , Rfl: 2 .  senna-docusate (SENOKOT-S) 8.6-50 MG tablet, Take by mouth., Disp: , Rfl:  .  sulfamethoxazole-trimethoprim (BACTRIM,SEPTRA) 400-80 MG tablet, TAKE 1 TABLET (80 MG OF TRIMETHOPRIM TOTAL) BY MOUTH 2 (TWO) TIMES DAILY, Disp: , Rfl: 0  Past Medical History: No past medical history on file.  Tobacco Use: Social History   Tobacco Use  Smoking Status Former Smoker  . Last attempt to quit: 1980  . Years since  quitting: 39.2  Smokeless Tobacco Never Used    Labs: Recent Review Flowsheet Data    There is no flowsheet data to display.       Exercise Target Goals:    Exercise Program Goal: Individual exercise prescription set using results from initial 6 min walk test and THRR while considering  patient's activity barriers and safety.   Exercise Prescription Goal: Initial exercise prescription builds to 30-45 minutes a day of aerobic activity, 2-3 days per week.  Home exercise guidelines will be given to patient during program as part of exercise prescription that the participant will acknowledge.  Activity Barriers & Risk Stratification: Activity Barriers & Cardiac Risk Stratification - 08/12/17 1415      Activity Barriers & Cardiac Risk Stratification   Activity Barriers  Incisional Pain    Cardiac Risk Stratification  Moderate       6 Minute Walk: 6 Minute Walk    Row Name 08/12/17 1407 10/11/17 1751       6 Minute Walk   Phase  -  Discharge    Distance  1500 feet  1570 feet    Distance % Change  -  4.6 %    Distance Feet Change  -  70 ft    Walk Time  6 minutes  6 minutes    # of Rest Breaks  0  0    MPH  2.84  2.97    METS  3.99  3.47    RPE  8  11    Perceived Dyspnea   0  -    VO2 Peak  13.97  12.15    Symptoms  No  No    Resting HR  62 bpm  60 bpm    Resting BP  132/60  132/68    Resting Oxygen Saturation   100 %  -    Exercise Oxygen Saturation  during 6 min walk  99 %  99 %    Max Ex. HR  117 bpm  69 bpm    Max Ex. BP  172/72  170/80    2 Minute Post BP  136/66  -       Oxygen Initial Assessment:   Oxygen Re-Evaluation:   Oxygen Discharge (Final Oxygen Re-Evaluation):   Initial Exercise Prescription: Initial Exercise Prescription - 08/12/17 1400      Date of Initial Exercise RX and Referring Provider   Date  08/12/17    Referring Provider  Henke      Treadmill   MPH  2.8    Grade  2    Minutes  15    METs  4      Recumbant Bike   Level   4    RPM  60    Watts  50    Minutes  15    METs  4      T5 Nustep   Level  3    SPM  80    Minutes  15    METs  4      Prescription Details   Frequency (times per week)  3    Duration  Progress to 45 minutes of aerobic exercise without signs/symptoms of physical distress      Intensity   THRR 40-80% of Max Heartrate  96-131    Ratings of Perceived Exertion  11-13    Perceived Dyspnea  0-4      Resistance Training   Training Prescription  Yes    Weight  4 lb    Reps  10-15       Perform Capillary Blood Glucose checks as needed.  Exercise Prescription Changes: Exercise Prescription Changes    Row Name 08/12/17 1400 08/25/17 1400 09/07/17 1600 09/20/17 1700 09/22/17 1500     Response to Exercise   Blood Pressure (Admit)  132/60  126/82  -  -  104/56   Blood Pressure (Exercise)  172/72  156/84  142/84  -  142/72   Blood Pressure (Exit)  136/66  104/62  142/80  -  110/68   Heart Rate (Admit)  62 bpm  64 bpm  76 bpm  -  69 bpm   Heart Rate (Exercise)  117 bpm  110 bpm  105 bpm  -  101 bpm   Heart Rate (Exit)  61 bpm  57 bpm  79 bpm  -  64 bpm   Oxygen Saturation (Admit)  100 %  -  -  -  -   Oxygen Saturation (Exit)  99 %  -  -  -  -   Rating of Perceived Exertion (Exercise)  _0 -  11   Symptoms  -  none  none  -  none   Duration  -  Progress to 45 minutes of aerobic exercise without signs/symptoms of physical  distress  Continue with 45 min of aerobic exercise without signs/symptoms of physical distress.  -  Continue with 45 min of aerobic exercise without signs/symptoms of physical distress.   Intensity  -  THRR unchanged  -  -  THRR unchanged     Progression   Progression  -  Continue to progress workloads to maintain intensity without signs/symptoms of physical distress.  Continue to progress workloads to maintain intensity without signs/symptoms of physical distress.  -  Continue to progress workloads to maintain intensity without signs/symptoms of physical  distress.   Average METs  -  3.15  3.1  -  4.19     Resistance Training   Training Prescription  -  Yes  Yes  -  Yes   Weight  -  4 lb  4 lb  -  4 lb   Reps  -  10-15  10-15  -  10-15     Interval Training   Interval Training  -  No  No  -  No     Treadmill   MPH  -  -  2.9  -  2.9   Grade  -  -  2.5  -  2.5   Minutes  -  -  15  -  15   METs  -  -  4.42  -  4.42     Recumbant Bike   Level  -  4  -  -  5   RPM  -  60  -  -  60   Watts  -  22  -  -  49   Minutes  -  15  -  -  15   METs  -  2.8  -  -  3.96     REL-XR   Level  -  3  3  -  -   Minutes  -  15  15  -  -   METs  -  3.95  1.7  -  -     Home Exercise Plan   Plans to continue exercise at  -  -  -  Home (comment) walk  Home (comment) walk   Frequency  -  -  -  Add 2 additional days to program exercise sessions.  Add 2 additional days to program exercise sessions.   Initial Home Exercises Provided  -  -  -  09/20/17  09/20/17   Row Name 10/06/17 1500             Response to Exercise   Blood Pressure (Admit)  120/70       Blood Pressure (Exercise)  144/64       Blood Pressure (Exit)  124/60       Heart Rate (Admit)  55 bpm       Heart Rate (Exercise)  99 bpm       Heart Rate (Exit)  57 bpm       Rating of Perceived Exertion (Exercise)  11       Symptoms  none       Duration  Continue with 45 min of aerobic exercise without signs/symptoms of physical distress.       Intensity  THRR unchanged         Progression   Progression  Continue to progress workloads to maintain intensity without signs/symptoms of physical distress.       Average METs  4  Resistance Training   Training Prescription  Yes       Weight  4 lb       Reps  10-15         Interval Training   Interval Training  No         Recumbant Bike   Level  5       RPM  60       Watts  49       Minutes  15       METs  3.96         REL-XR   Level  5       Minutes  15       METs  3.3         Home Exercise Plan   Plans to continue  exercise at  Home (comment) walk       Frequency  Add 2 additional days to program exercise sessions.       Initial Home Exercises Provided  09/20/17          Exercise Comments: Exercise Comments    Row Name 08/16/17 1639 09/20/17 1736 10/14/17 1637       Exercise Comments   First full day of exercise!  Patient was oriented to gym and equipment including functions, settings, policies, and procedures.  Patient's individual exercise prescription and treatment plan were reviewed.  All starting workloads were established based on the results of the 6 minute walk test done at initial orientation visit.  The plan for exercise progression was also introduced and progression will be customized based on patient's performance and goals.  Reviewed home exercise with pt today.  Pt plans to walk for exercise.  Reviewed THR, pulse, RPE, sign and symptoms, NTG use, and when to call 911 or MD.  Also discussed weather considerations and indoor options.  Pt voiced understanding.  Olivia graduated today from  rehab with 34 sessions completed.  Details of the patient's exercise prescription and what He needs to do in order to continue the prescription and progress were discussed with patient.  Patient was given a copy of prescription and goals.  Patient verbalized understanding.  Dewitte plans to continue to exercise by joining the [place] with his wife.        Exercise Goals and Review: Exercise Goals    Row Name 08/12/17 1406             Exercise Goals   Increase Physical Activity  Yes       Intervention  Provide advice, education, support and counseling about physical activity/exercise needs.;Develop an individualized exercise prescription for aerobic and resistive training based on initial evaluation findings, risk stratification, comorbidities and participant's personal goals.       Expected Outcomes  Short Term: Attend rehab on a regular basis to increase amount of physical activity.;Long Term: Add in home  exercise to make exercise part of routine and to increase amount of physical activity.;Long Term: Exercising regularly at least 3-5 days a week.       Increase Strength and Stamina  Yes       Intervention  Provide advice, education, support and counseling about physical activity/exercise needs.;Develop an individualized exercise prescription for aerobic and resistive training based on initial evaluation findings, risk stratification, comorbidities and participant's personal goals.       Expected Outcomes  Short Term: Increase workloads from initial exercise prescription for resistance, speed, and METs.;Short Term: Perform resistance training exercises routinely during rehab  and add in resistance training at home;Long Term: Improve cardiorespiratory fitness, muscular endurance and strength as measured by increased METs and functional capacity (6MWT)       Able to understand and use rate of perceived exertion (RPE) scale  Yes       Intervention  Provide education and explanation on how to use RPE scale       Expected Outcomes  Short Term: Able to use RPE daily in rehab to express subjective intensity level;Long Term:  Able to use RPE to guide intensity level when exercising independently       Able to understand and use Dyspnea scale  Yes       Intervention  Provide education and explanation on how to use Dyspnea scale       Expected Outcomes  Short Term: Able to use Dyspnea scale daily in rehab to express subjective sense of shortness of breath during exertion;Long Term: Able to use Dyspnea scale to guide intensity level when exercising independently       Knowledge and understanding of Target Heart Rate Range (THRR)  Yes       Intervention  Provide education and explanation of THRR including how the numbers were predicted and where they are located for reference       Expected Outcomes  Short Term: Able to state/look up THRR;Long Term: Able to use THRR to govern intensity when exercising  independently;Short Term: Able to use daily as guideline for intensity in rehab       Able to check pulse independently  Yes       Intervention  Provide education and demonstration on how to check pulse in carotid and radial arteries.;Review the importance of being able to check your own pulse for safety during independent exercise       Expected Outcomes  Short Term: Able to explain why pulse checking is important during independent exercise;Long Term: Able to check pulse independently and accurately       Understanding of Exercise Prescription  Yes       Intervention  Provide education, explanation, and written materials on patient's individual exercise prescription       Expected Outcomes  Short Term: Able to explain program exercise prescription;Long Term: Able to explain home exercise prescription to exercise independently          Exercise Goals Re-Evaluation : Exercise Goals Re-Evaluation    Row Name 08/16/17 1640 08/25/17 1415 09/07/17 1604 09/20/17 1736 10/06/17 1532     Exercise Goal Re-Evaluation   Exercise Goals Review  Increase Physical Activity;Able to understand and use rate of perceived exertion (RPE) scale;Knowledge and understanding of Target Heart Rate Range (THRR);Understanding of Exercise Prescription;Increase Strength and Stamina  Increase Physical Activity;Able to understand and use rate of perceived exertion (RPE) scale;Increase Strength and Stamina  Increase Physical Activity;Able to understand and use rate of perceived exertion (RPE) scale;Increase Strength and Stamina  Increase Physical Activity;Increase Strength and Stamina;Able to understand and use rate of perceived exertion (RPE) scale;Knowledge and understanding of Target Heart Rate Range (THRR);Able to check pulse independently;Understanding of Exercise Prescription  Able to understand and use rate of perceived exertion (RPE) scale;Increase Physical Activity;Increase Strength and Stamina   Comments  Reviewed RPE  scale, THR and program prescription with pt today.  Pt voiced understanding and was given a copy of goals to take home.   Loel is tolerating exercise well.  Staff will continue to monitor progress.  Malick is progressing well and has moved up his  speed and incline on TM.  Staff will continue to monitor  Reviewed home exercise with pt today.  Pt plans to walk for exercise.  Reviewed THR, pulse, RPE, sign and symptoms, NTG use, and when to call 911 or MD.  Also discussed weather considerations and indoor options.  Pt voiced understanding.  Tamon is progressing well with exercise.  Staff will encourage intervals to increase intensity.   Expected Outcomes  Short: Use RPE daily to regulate intensity.  Long: Follow program prescription in THR.  Short - Brayden will attend class regularly Long - Darren will improve MET level  Short - Timo will attend class regularly, staff will review home exercise Long - Sheryl will improve overall MET level  Short - Rumaldo will add walking for 20-30 min in addition to program sessions Long - Curtiss will maintain exercise on his own  Short - Karol will continue to attend regularly Long - Wyatt will maintain fitness on his own      Discharge Exercise Prescription (Final Exercise Prescription Changes): Exercise Prescription Changes - 10/06/17 1500      Response to Exercise   Blood Pressure (Admit)  120/70    Blood Pressure (Exercise)  144/64    Blood Pressure (Exit)  124/60    Heart Rate (Admit)  55 bpm    Heart Rate (Exercise)  99 bpm    Heart Rate (Exit)  57 bpm    Rating of Perceived Exertion (Exercise)  11    Symptoms  none    Duration  Continue with 45 min of aerobic exercise without signs/symptoms of physical distress.    Intensity  THRR unchanged      Progression   Progression  Continue to progress workloads to maintain intensity without signs/symptoms of physical distress.    Average METs  4      Resistance Training   Training Prescription  Yes    Weight  4 lb    Reps   10-15      Interval Training   Interval Training  No      Recumbant Bike   Level  5    RPM  60    Watts  49    Minutes  15    METs  3.96      REL-XR   Level  5    Minutes  15    METs  3.3      Home Exercise Plan   Plans to continue exercise at  Home (comment) walk    Frequency  Add 2 additional days to program exercise sessions.    Initial Home Exercises Provided  09/20/17       Nutrition:  Target Goals: Understanding of nutrition guidelines, daily intake of sodium '1500mg'$ , cholesterol '200mg'$ , calories 30% from fat and 7% or less from saturated fats, daily to have 5 or more servings of fruits and vegetables.  Biometrics: Pre Biometrics - 08/12/17 1405      Pre Biometrics   Height  5' 10.75" (1.797 m)    Weight  171 lb 12.8 oz (77.9 kg)    Waist Circumference  36 inches    Hip Circumference  40 inches    Waist to Hip Ratio  0.9 %    BMI (Calculated)  24.13    Single Leg Stand  10.5 seconds      Post Biometrics - 10/11/17 1754       Post  Biometrics   Height  5' 10.75" (1.797 m)    Weight  174 lb (78.9 kg)    Waist Circumference  36 inches    Hip Circumference  39 inches    Waist to Hip Ratio  0.92 %    BMI (Calculated)  24.44    Single Leg Stand  17.67 seconds       Nutrition Therapy Plan and Nutrition Goals: Nutrition Therapy & Goals - 08/12/17 1412      Intervention Plan   Intervention  Prescribe, educate and counsel regarding individualized specific dietary modifications aiming towards targeted core components such as weight, hypertension, lipid management, diabetes, heart failure and other comorbidities.;Nutrition handout(s) given to patient.    Expected Outcomes  Short Term Goal: Understand basic principles of dietary content, such as calories, fat, sodium, cholesterol and nutrients.;Short Term Goal: A plan has been developed with personal nutrition goals set during dietitian appointment.;Long Term Goal: Adherence to prescribed nutrition plan.        Nutrition Assessments: Nutrition Assessments - 08/12/17 1149      MEDFICTS Scores   Pre Score  32       Nutrition Goals Re-Evaluation: Nutrition Goals Re-Evaluation    [place] Name 09/14/17 1057 10/06/17 1413           Goals   Comment  Johns wife has already adjusted their diet to lower sodium, no red meat or fried foods.  They want to schedule to meet together with the RD  Taiten said his wife is trying to get an appt with the dietician for both of them to go. Byren said she has the phone number. Caelin feels they are already eating healthy.,      Expected Outcome  Short - Titan will continue to eat a heart healthy diet and meet with the RD Long - Cabell will maintain heart healthy diet   Maintain heart healthy diet.         Nutrition Goals Discharge (Final Nutrition Goals Re-Evaluation): Nutrition Goals Re-Evaluation - 10/06/17 1413      Goals   Comment  Foster said his wife is trying to get an appt with the dietician for both of them to go. Brenin said she has the phone number. Colen feels they are already eating healthy.,    Expected Outcome  Maintain heart healthy diet.       Psychosocial: Target Goals: Acknowledge presence or absence of significant depression and/or stress, maximize coping skills, provide positive support system. Participant is able to verbalize types and ability to use techniques and skills needed for reducing stress and depression.   Initial Review & Psychosocial Screening: Initial Psych Review & Screening - 08/12/17 1412      Initial Review   Current issues with  Current Sleep Concerns      Family Dynamics   Good Support System?  Yes      Screening Interventions   Interventions  Encouraged to exercise;Program counselor consult;To provide support and resources with identified psychosocial needs    Expected Outcomes  Short Term goal: Utilizing psychosocial counselor, staff and physician to assist with identification of specific Stressors or current issues  interfering with healing process. Setting desired goal for each stressor or current issue identified.;Long Term Goal: Stressors or current issues are controlled or eliminated.;Short Term goal: Identification and review with participant of any Quality of Life or Depression concerns found by scoring the questionnaire.;Long Term goal: The participant improves quality of Life and PHQ9 Scores as seen by post scores and/or verbalization of changes       Quality of Life Scores:  Quality of Life - 08/12/17 1148      Quality of Life Scores   Health/Function Pre  21 %    Socioeconomic Pre  20.38 %    Psych/Spiritual Pre  20.71 %    Family Pre  21 %    GLOBAL Pre  20.8 %      Scores of 19 and below usually indicate a poorer quality of life in these areas.  A difference of  2-3 points is a clinically meaningful difference.  A difference of 2-3 points in the total score of the Quality of Life Index has been associated with significant improvement in overall quality of life, self-image, physical symptoms, and general health in studies assessing change in quality of life.  PHQ-9: Recent Review Flowsheet Data    Depression screen [facility] 2/9 08/12/2017   Decreased Interest 0   Down, Depressed, Hopeless 1   PHQ - 2 Score 1   Altered sleeping 0   Tired, decreased energy 1   Change in appetite 0   Feeling bad or failure about yourself  0   Trouble concentrating 0   Moving slowly or fidgety/restless 0   Suicidal thoughts 0   PHQ-9 Score 2   Difficult doing work/chores Somewhat difficult     Interpretation of Total Score  Total Score Depression Severity:  1-4 = Minimal depression, 5-9 = Mild depression, 10-14 = Moderate depression, 15-19 = Moderately severe depression, 20-27 = Severe depression   Psychosocial Evaluation and Intervention: Psychosocial Evaluation - 08/18/17 1711      Psychosocial Evaluation & Interventions   Interventions  Encouraged to exercise with the program and follow exercise  prescription;Stress management education    Comments  Counselor met with Mr. Salton Mcisaac) today and his spouse Manuela Schwartz for initial psychosocial evaluation.  He is a 72 year old who had a CABGx4 in late November.  He has a strong support system with a spouse of 41 years and (3) daughters who live locally.  Noha reports sleeping better lately and his appetite has returned for the most part.  He is supposed to see a specialist about his short term memory and sleep problems in the near future, but wanted to recover more before that occurs.  Claron denies a history of depression or anxiety or any current symptoms.  He and his spouse agree that he is typically in a positive mood.  His primary stressors are his health; the loss of his 13 year old dog who passed recently; and the challenges of having a daughter who has bipolar disorder.  Counselor provided some information on the [place] groups for parents for support for this.  Derrious has goals to improve his breathing and return to more normal activities like play golf in the future.  Staff will follow with him throughout the course of this program.     Expected Outcomes  Jerry will benefit from consistent exercise to achieve his stated goals.  The educational and psychoeducational components of this program will help him understand and cope better with his condition and his current stressors.  Staff will follow.    Continue Psychosocial Services   Follow up required by staff       Psychosocial Re-Evaluation: Psychosocial Re-Evaluation    [place] Name 09/06/17 1740 09/13/17 1709 09/14/17 1058 10/04/17 1713 10/06/17 1511     Psychosocial Re-Evaluation   Current issues with  Current Stress Concerns  Current Stress Concerns  Current Stress Concerns  Current Stress Concerns  -  Comments  Ms. Nicole Kindred asked to speak with counselor today in regards to their daughter who has mental illness.  She is in need of resources for the daughter once she is discharged from the inpatient  unit she is currently in.  Counselor will research these and provide several options to Ms. Nicole Kindred tomorrow.  She reports she and Mr. Andel are attending the [facility] parent support groups and they have been extremely helpful.    Counselor follow up with Mr. Chismar today re: status of daughter in need of inpatient Mental Health facility.  He reports she is currently living back in the home with he and his spouse awaiting a place to open up in [place] for 4-6 weeks in patient treatment.  He states things are going "okay" currently, and his daughter is not pleased with this option, but Mr/Ms Wien are hoping she will cooperate when the place is available in a few weeks.  Counselor will continue to follow.  Mr. Leonhard seemed hopeful today with options available and a plan in place.    Johns stress is currently is his adult daughter.  He does not feel it is adversely affecting him at this time  Counselor met with Carlous's spouse today to discuss the status with their adult daughter.  She is finally in an intensive program for her dual diagnosis and this is relieving for them both. They just returned to class due to helping to manage their daughter's transition into care.  Counselor commended Ms. Coluccio for she and her husband making tough united decisions re: the care of their daughter.    Jakarie said the Cardiac Rehab program has really helped him since he would not go and exercise on his own. Dylyn said he and his wife went to the support group that [employer] ment[employer]end and they found out about a place in [place] that helped their daughter who is bipolar. Sylvain said that helped his stress level.   Expected Outcomes  Mr & Ms. Vick will continue to attend support groups to help cope with their daughter's mental illness.  She will contact providers for further treatment for her daughter once she is discharged.  Counselor encouraged Ms. Banas to research her options and rights to make decisions impacting her  daughter.    Mr/Ms Sine will continue to practice good boundaries and limits with their daughter and self-care for themselves.    Short - Tayon will coninue to attend class Long - Zaide will maintain good control of stress  Short - Shalin and his wife will continue to agree on their interventions and interactions re: their daughter's care.  Long - Boe will exercise consistently to help with stress and heart health.  Long term-Glynn will use his Silver Sneakers and exercise with his wife to help decrease his stress levels. Ashante said his dog died in 2023-07-13 around the time that he had open heart surgery and that his surgery was a surprise but he has dealt with it and will cont to deal with it and improve his heart health.    Interventions  Stress management education  Stress management education  Encouraged to attend Cardiac Rehabilitation for the exercise  Relaxation education;Stress management education  Encouraged to attend Cardiac Rehabilitation for the exercise   Continue Psychosocial Services   -  Follow up required by staff  Follow up required by staff  -  -     Initial Review   Source of Stress Concerns  -  -  -  -  Family      Psychosocial Discharge (Final Psychosocial Re-Evaluation): Psychosocial Re-Evaluation - 10/06/17 1511      Psychosocial Re-Evaluation   Comments  Eustacio said the Cardiac Rehab program has really helped him since he would not go and exercise on his own. Selvin said he and his wife went to the support group that [employer] mentio[employer] and they found out about a place in [place] that helped their daughter who is bipolar. Kden said that helped his stress level.    Expected Outcomes  Long term-Arsh will use his Silver Sneakers and exercise with his wife to help decrease his stress levels. Alarik said his dog died in 2023-07-26 around the time that he had open heart surgery and that his surgery was a surprise but he has dealt with it and will cont to deal with it and improve his  heart health.     Interventions  Encouraged to attend Cardiac Rehabilitation for the exercise      Initial Review   Source of Stress Concerns  Family       Vocational Rehabilitation: Provide vocational rehab assistance to qualifying candidates.   Vocational Rehab Evaluation & Intervention: Vocational Rehab - 08/12/17 1413      Initial Vocational Rehab Evaluation & Intervention   Assessment shows need for Vocational Rehabilitation  No       Education: Education Goals: Education classes will be provided on a variety of topics geared toward better understanding of heart health and risk factor modification. Participant will state understanding/return demonstration of topics presented as noted by education test scores.  Learning Barriers/Preferences: Learning Barriers/Preferences - 08/12/17 1413      Learning Barriers/Preferences   Learning Barriers  None    Learning Preferences  None       Education Topics:  AED/CPR: - Group verbal and written instruction with the use of models to demonstrate the basic use of the AED with the basic ABC's of resuscitation.   Cardiac Rehab from 10/11/2017 in [facility] Cardiac and Pulmonary Rehab  Date  08/30/17  Educator  MA  Instruction Review Code  1- Verbalizes Understanding      General Nutrition Guidelines/Fats and Fiber: -Group instruction provided by verbal, written material, models and posters to present the general guidelines for heart healthy nutrition. Gives an explanation and review of dietary fats and fiber.   Cardiac Rehab from 10/11/2017 in [facility] Cardiac and Pulmonary Rehab  Date  10/11/17  Educator  PI  Instruction Review Code  1- Verbalizes Understanding      Controlling Sodium/Reading Food Labels: -Group verbal and written material supporting the discussion of sodium use in heart healthy nutrition. Review and explanation with models, verbal and written materials for utilization of the food label.   Cardiac Rehab from 10/11/2017  in [facility] Cardiac and Pulmonary Rehab  Date  08/23/17  Educator  PI  Instruction Review Code  1- Verbalizes Understanding      Exercise Physiology & General Exercise Guidelines: - Group verbal and written instruction with models to review the exercise physiology of the cardiovascular system and associated critical values. Provides general exercise guidelines with specific guidelines to those with heart or lung disease.    Cardiac Rehab from 10/11/2017 in [facility] Cardiac and Pulmonary Rehab  Date  09/06/17  Educator  [facility]  Instruction Review Code  1- Verbalizes Understanding      Aerobic Exercise & Resistance Training: - Gives group verbal and written instruction on the various components of exercise. Focuses on aerobic and  resistive training programs and the benefits of this training and how to safely progress through these programs..   Cardiac Rehab from 10/11/2017 in [facility] Cardiac and Pulmonary Rehab  Date  09/13/17  Educator  AS  Instruction Review Code  1- Verbalizes Understanding      Flexibility, Balance, Mind/Body Relaxation: Provides group verbal/written instruction on the benefits of flexibility and balance training, including mind/body exercise modes such as yoga, pilates and tai chi.  Demonstration and skill practice provided.   Cardiac Rehab from 10/11/2017 in [facility] Cardiac and Pulmonary Rehab  Date  09/15/17  Educator  AS  Instruction Review Code  1- Verbalizes Understanding      Stress and Anxiety: - Provides group verbal and written instruction about the health risks of elevated stress and causes of high stress.  Discuss the correlation between heart/lung disease and anxiety and treatment options. Review healthy ways to manage with stress and anxiety.   Depression: - Provides group verbal and written instruction on the correlation between heart/lung disease and depressed mood, treatment options, and the stigmas associated with seeking treatment.   Anatomy & Physiology of  the Heart: - Group verbal and written instruction and models provide basic cardiac anatomy and physiology, with the coronary electrical and arterial systems. Review of Valvular disease and Heart Failure   Cardiac Rehab from 10/11/2017 in [facility] Cardiac and Pulmonary Rehab  Date  10/04/17  Educator  CE  Instruction Review Code  1- Verbalizes Understanding      Cardiac Procedures: - Group verbal and written instruction to review commonly prescribed medications for heart disease. Reviews the medication, class of the drug, and side effects. Includes the steps to properly store meds and maintain the prescription regimen. (beta blockers and nitrates)   Cardiac Rehab from 10/11/2017 in [facility] Cardiac and Pulmonary Rehab  Date  08/18/17  Educator  [facility]  Instruction Review Code  1- Verbalizes Understanding      Cardiac Medications I: - Group verbal and written instruction to review commonly prescribed medications for heart disease. Reviews the medication, class of the drug, and side effects. Includes the steps to properly store meds and maintain the prescription regimen.   Cardiac Medications II: -Group verbal and written instruction to review commonly prescribed medications for heart disease. Reviews the medication, class of the drug, and side effects. (all other drug classes)   Cardiac Rehab from 10/11/2017 in [facility] Cardiac and Pulmonary Rehab  Date  09/20/17  Educator  CE  Instruction Review Code  1- Verbalizes Understanding       Go Sex-Intimacy & Heart Disease, Get SMART - Goal Setting: - Group verbal and written instruction through game format to discuss heart disease and the return to sexual intimacy. Provides group verbal and written material to discuss and apply goal setting through the application of the S.M.A.R.T. Method.   Cardiac Rehab from 10/11/2017 in [facility] Cardiac and Pulmonary Rehab  Date  08/18/17  Educator  [facility]  Instruction Review Code  1- Verbalizes Understanding      Other  Matters of the Heart: - Provides group verbal, written materials and models to describe Stable Angina and Peripheral Artery. Includes description of the disease process and treatment options available to the cardiac patient.   Exercise & Equipment Safety: - Individual verbal instruction and demonstration of equipment use and safety with use of the equipment.   Cardiac Rehab from 10/11/2017 in [facility] Cardiac and Pulmonary Rehab  Date  08/12/17  Educator  [facility]  Instruction Review Code  1- Verbalizes Understanding      Infection Prevention: - Provides verbal and written material to individual with discussion of infection control including proper hand washing and proper equipment cleaning during exercise session.   Cardiac Rehab from 10/11/2017 in [facility] Cardiac and Pulmonary Rehab  Date  08/12/17  Educator  [facility]  Instruction Review Code  1- Verbalizes Understanding      Falls Prevention: - Provides verbal and written material to individual with discussion of falls prevention and safety.   Cardiac Rehab from 10/11/2017 in [facility] Cardiac and Pulmonary Rehab  Date  08/12/17  Educator  [facility]  Instruction Review Code  1- Verbalizes Understanding      Diabetes: - Individual verbal and written instruction to review signs/symptoms of diabetes, desired ranges of glucose level fasting, after meals and with exercise. Acknowledge that pre and post exercise glucose checks will be done for 3 sessions at entry of program.   Know Your Numbers and Risk Factors: -Group verbal and written instruction about important numbers in your health.  Discussion of what are risk factors and how they play a role in the disease process.  Review of Cholesterol, Blood Pressure, Diabetes, and BMI and the role they play in your overall health.   Cardiac Rehab from 10/11/2017 in [facility] Cardiac and Pulmonary Rehab  Date  09/20/17  Educator  CE  Instruction Review Code  1- Verbalizes Understanding      Sleep Hygiene: -Provides  group verbal and written instruction about how sleep can affect your health.  Define sleep hygiene, discuss sleep cycles and impact of sleep habits. Review good sleep hygiene tips.    Cardiac Rehab from 10/11/2017 in [facility] Cardiac and Pulmonary Rehab  Date  10/06/17  Educator  [facility]  Instruction Review Code  1- Verbalizes Understanding      Other: -Provides group and verbal instruction on various topics (see comments)   Knowledge Questionnaire Score: Knowledge Questionnaire Score - 08/12/17 1148      Knowledge Questionnaire Score   Pre Score  26/28 correct answers reviewed with Iden       Core Components/Risk Factors/Patient Goals at Admission: Personal Goals and Risk Factors at Admission - 08/12/17 1410      Core Components/Risk Factors/Patient Goals on Admission    Weight Management  Weight Maintenance;Yes    Intervention  Weight Management: Develop a combined nutrition and exercise program designed to reach desired caloric intake, while maintaining appropriate intake of nutrient and fiber, sodium and fats, and appropriate energy expenditure required for the weight goal.;Weight Management: Provide education and appropriate resources to help participant work on and attain dietary goals.;Weight Management/Obesity: Establish reasonable short term and long term weight goals.    Admit Weight  172 lb (78 kg)    Expected Outcomes  Short Term: Continue to assess and modify interventions until short term weight is achieved;Long Term: Adherence to nutrition and physical activity/exercise program aimed toward attainment of established weight goal;Weight Maintenance: Understanding of the daily nutrition guidelines, which includes 25-35% calories from fat, 7% or less cal from saturated fats, less than '200mg'$  cholesterol, less than 1.5gm of sodium, & 5 or more servings of fruits and vegetables daily;Understanding recommendations for meals to include 15-35% energy as protein, 25-35% energy from fat, 35-60%  energy from carbohydrates, less than '200mg'$  of dietary cholesterol, 20-35 gm of total fiber daily;Understanding of distribution of calorie intake throughout the day with the consumption of 4-5 meals/snacks    Hypertension  Yes    Intervention  Provide education  on lifestyle modifcations including regular physical activity/exercise, weight management, moderate sodium restriction and increased consumption of fresh fruit, vegetables, and low fat dairy, alcohol moderation, and smoking cessation.;Monitor prescription use compliance.    Expected Outcomes  Short Term: Continued assessment and intervention until BP is < 140/67m HG in hypertensive participants. < 130/866mHG in hypertensive participants with diabetes, heart failure or chronic kidney disease.;Long Term: Maintenance of blood pressure at goal levels.    Lipids  Yes    Intervention  Provide education and support for participant on nutrition & aerobic/resistive exercise along with prescribed medications to achieve LDL '70mg'$ , HDL >'40mg'$ .    Expected Outcomes  Short Term: Participant states understanding of desired cholesterol values and is compliant with medications prescribed. Participant is following exercise prescription and nutrition guidelines.;Long Term: Cholesterol controlled with medications as prescribed, with individualized exercise RX and with personalized nutrition plan. Value goals: LDL < '70mg'$ , HDL > 40 mg.       Core Components/Risk Factors/Patient Goals Review:  Goals and Risk Factor Review    Row Name 09/14/17 1051 10/06/17 1509 10/06/17 1513         Core Components/Risk Factors/Patient Goals Review   Personal Goals Review  Lipids;Hypertension;Stress  -  Weight Management/Obesity;Lipids     Review  JoJensens taking all meds as directed.  He had some muscle soreness last week from overdoing exercise - it is better and he plasn to take it easier next sessions.  He has some stress from his daughter but ihe doesnt feel its negatively  impacting his health at this time.  Terence said the Cardiac Rehab program has really helped him since he would not go and exercise on his own. Kalim said he and his wife went to the support group that [employer] and [place]. Cosme said that helped his stress level.   Prospero's weight has been stable. His blood pressure has been very good and he eats healthy so he expects his lipid blood work to be fine.      Expected Outcomes  Short - JoLindwoodill continue to attend HT and take meds as directed  Jo[place]owas [place] his goal when he graduates from [place] to exercise using his Silver Sneakers benefit.   Heart healthy living with his wife and exercise with her using his Silver Sneakers benefit.         Core Components/Risk Factors/Patient Goals at Discharge (Final Review):  Goals and Risk Factor Review - 10/06/17 1513      Core Components/Risk Factors/Patient Goals Review   Personal Goals Review  Weight Management/Obesity;Lipids    Review  Ignatius's weight has been stable. His blood pressure has been very good and he eats healthy so he expects his lipid blood work to be fine.     Expected Outcomes  Heart healthy living with his wife and exercise with her using his Silver Sneakers benefit.        ITP Comments: ITP Comments    Row Name 08/12/17 1404 08/18/17 0643 09/15/17 0609 10/13/17 0653 10/14/17 1637   ITP Comments  Med Review completed. Initial ITP created. Diagnosis can be found Care Everywhere 08/02/17  30 Day review. Continue with ITP unless directed changes per Medical Director review.   New to program  30 day review. Continue with ITP unless directed changes per Medical Director review.  30 Day review. Continue with ITP unless directed  changes per Medical Director review.    Discharge ITP sent and signed by Dr. Sabra Heck.  Discharge Summary routed to PCP and cardiologist.      Comments: Discharge ITP

## 2017-10-14 NOTE — Progress Notes (Signed)
Discharge Progress Report  Patient Details  Name: Omar Kennedy MRN: [id number] Date of Birth: September 27, 1945 Referring Provider:     Cardiac Rehab from 08/12/2017 in [facility]iac [facility]  Referring Provider  Henke       Number of Visits: 34/36  Reason for Discharge:  Patient reached a stable level of exercise. Patient independent in their exercise. Patient has met program and personal goals.  Smoking History:  Social History   Tobacco Use  Smoking Status Former Smoker  . Last attempt to quit: 1980  . Years since quitting: 39.2  Smokeless Tobacco Never Used    Diagnosis:  S/P CABG x 4  ADL UCSD:   Initial Exercise Prescription: Initial Exercise Prescription - 08/12/17 1400      Date of Initial Exercise RX and Referring Provider   Date  08/12/17    Referring Provider  Henke      Treadmill   MPH  2.8    Grade  2    Minutes  15    METs  4      Recumbant Bike   Level  4    RPM  60    Watts  50    Minutes  15    METs  4      T5 Nustep   Level  3    SPM  80    Minutes  15    METs  4      Prescription Details   Frequency (times per week)  3    Duration  Progress to 45 minutes of aerobic exercise without signs/symptoms of physical distress      Intensity   THRR 40-80% of Max Heartrate  96-131    Ratings of Perceived Exertion  11-13    Perceived Dyspnea  0-4      Resistance Training   Training Prescription  Yes    Weight  4 lb    Reps  10-15       Discharge Exercise Prescription (Final Exercise Prescription Changes): Exercise Prescription Changes - 10/06/17 1500      Response to Exercise   Blood Pressure (Admit)  120/70    Blood Pressure (Exercise)  144/64    Blood Pressure (Exit)  124/60    Heart Rate (Admit)  55 bpm    Heart Rate (Exercise)  99 bpm    Heart Rate (Exit)  57 bpm    Rating of Perceived Exertion (Exercise)  11    Symptoms  none    Duration  Continue with 45 min of aerobic exercise without signs/symptoms of physical  distress.    Intensity  THRR unchanged      Progression   Progression  Continue to progress workloads to maintain intensity without signs/symptoms of physical distress.    Average METs  4      Resistance Training   Training Prescription  Yes    Weight  4 lb    Reps  10-15      Interval Training   Interval Training  No      Recumbant Bike   Level  5    RPM  60    Watts  49    Minutes  15    METs  3.96      REL-XR   Level  5    Minutes  15    METs  3.3      Home Exercise Plan   Plans to continue exercise at  Home (comment)  walk    Frequency  Add 2 additional days to program exercise sessions.    Initial Home Exercises Provided  09/20/17       Functional Capacity: 6 Minute Walk    Row Name 08/12/17 1407 10/11/17 1751       6 Minute Walk   Phase  -  Discharge    Distance  1500 feet  1570 feet    Distance % Change  -  4.6 %    Distance Feet Change  -  70 ft    Walk Time  6 minutes  6 minutes    # of Rest Breaks  0  0    MPH  2.84  2.97    METS  3.99  3.47    RPE  8  11    Perceived Dyspnea   0  -    VO2 Peak  13.97  12.15    Symptoms  No  No    Resting HR  62 bpm  60 bpm    Resting BP  132/60  132/68    Resting Oxygen Saturation   100 %  -    Exercise Oxygen Saturation  during 6 min walk  99 %  99 %    Max Ex. HR  117 bpm  69 bpm    Max Ex. BP  172/72  170/80    2 Minute Post BP  136/66  -       Psychological, QOL, Others - Outcomes: PHQ 2/9: Depression screen PHQ 2/9 08/12/2017  Decreased Interest 0  Down, Depressed, Hopeless 1  PHQ - 2 Score 1  Altered sleeping 0  Tired, decreased energy 1  Change in appetite 0  Feeling bad or failure about yourself  0  Trouble concentrating 0  Moving slowly or fidgety/restless 0  Suicidal thoughts 0  PHQ-9 Score 2  Difficult doing work/chores Somewhat difficult    Quality of Life: Quality of Life - 08/12/17 1148      Quality of Life Scores   Health/Function Pre  21 %    Socioeconomic Pre  20.38 %     Psych/Spiritual Pre  20.71 %    Family Pre  21 %    GLOBAL Pre  20.8 %       Personal Goals: Goals established at orientation with interventions provided to work toward goal. Personal Goals and Risk Factors at Admission - 08/12/17 1410      Core Components/Risk Factors/Patient Goals on Admission    Weight Management  Weight Maintenance;Yes    Intervention  Weight Management: Develop a combined nutrition and exercise program designed to reach desired caloric intake, while maintaining appropriate intake of nutrient and fiber, sodium and fats, and appropriate energy expenditure required for the weight goal.;Weight Management: Provide education and appropriate resources to help participant work on and attain dietary goals.;Weight Management/Obesity: Establish reasonable short term and long term weight goals.    Admit Weight  172 lb (78 kg)    Expected Outcomes  Short Term: Continue to assess and modify interventions until short term weight is achieved;Long Term: Adherence to nutrition and physical activity/exercise program aimed toward attainment of established weight goal;Weight Maintenance: Understanding of the daily nutrition guidelines, which includes 25-35% calories from fat, 7% or less cal from saturated fats, less than '200mg'$  cholesterol, less than 1.5gm of sodium, & 5 or more servings of fruits and vegetables daily;Understanding recommendations for meals to include 15-35% energy as protein, 25-35% energy from fat, 35-60% energy from  carbohydrates, less than 262m of dietary cholesterol, 20-35 gm of total fiber daily;Understanding of distribution of calorie intake throughout the day with the consumption of 4-5 meals/snacks    Hypertension  Yes    Intervention  Provide education on lifestyle modifcations including regular physical activity/exercise, weight management, moderate sodium restriction and increased consumption of fresh fruit, vegetables, and low fat dairy, alcohol moderation, and smoking  cessation.;Monitor prescription use compliance.    Expected Outcomes  Short Term: Continued assessment and intervention until BP is < 140/923mHG in hypertensive participants. < 130/8019mG in hypertensive participants with diabetes, heart failure or chronic kidney disease.;Long Term: Maintenance of blood pressure at goal levels.    Lipids  Yes    Intervention  Provide education and support for participant on nutrition & aerobic/resistive exercise along with prescribed medications to achieve LDL <65m58mDL >40mg33m Expected Outcomes  Short Term: Participant states understanding of desired cholesterol values and is compliant with medications prescribed. Participant is following exercise prescription and nutrition guidelines.;Long Term: Cholesterol controlled with medications as prescribed, with individualized exercise RX and with personalized nutrition plan. Value goals: LDL < 65mg,71m > 40 mg.        Personal Goals Discharge: Goals and Risk Factor Review    Row Name 09/14/17 1051 10/06/17 1509 10/06/17 1513         Core Components/Risk Factors/Patient Goals Review   Personal Goals Review  Lipids;Hypertension;Stress  -  Weight Management/Obesity;Lipids     Review  Brysen iHensleyking all meds as directed.  He had some muscle soreness last week from overdoing exercise - it is better and he plasn to take it easier next sessions.  He has some stress from his daughter but ihe doesnt feel its negatively impacting his health at this time.  Javi said the Cardiac Rehab program has really helped him since he would not go and exercise on his own. Demichael said he and his wife went to the support group that Kathy [employer] and they [place]. Karion said that helped his stress level.   Jahmire's weight has been stable. His blood pressure has been very good and he eats healthy so he expects his lipid blood work to be fine.      Expected Outcomes   Short - Kemoni wKhalfanicontinue to attend HT and take meds as directed  Everet hJaxdenilverCallaways goal when he graduates from [facility] exercise using his [employer]ne[employer] benefit.   Heart healthy living with his wife and exercise with her using his Silver [employer]ne[employer] benefit.         Exercise Goals and Review: Exercise Goals    Row Name 08/12/17 1406             Exercise Goals   Increase Physical Activity  Yes       Intervention  Provide advice, education, support and counseling about physical activity/exercise needs.;Develop an individualized exercise prescription for aerobic and resistive training based on initial evaluation findings, risk stratification, comorbidities and participant's personal goals.       Expected Outcomes  Short Term: Attend rehab on a regular basis to increase amount of physical activity.;Long Term: Add in home exercise to make exercise part of routine and to increase amount of physical activity.;Long Term: Exercising regularly at least 3-5 days a week.       Increase Strength and Stamina  Yes  Intervention  Provide advice, education, support and counseling about physical activity/exercise needs.;Develop an individualized exercise prescription for aerobic and resistive training based on initial evaluation findings, risk stratification, comorbidities and participant's personal goals.       Expected Outcomes  Short Term: Increase workloads from initial exercise prescription for resistance, speed, and METs.;Short Term: Perform resistance training exercises routinely during rehab and add in resistance training at home;Long Term: Improve cardiorespiratory fitness, muscular endurance and strength as measured by increased METs and functional capacity (6MWT)       Able to understand and use rate of perceived exertion (RPE) scale  Yes       Intervention  Provide education and explanation on how to use RPE scale       Expected Outcomes  Short Term: Able to use RPE daily  in rehab to express subjective intensity level;Long Term:  Able to use RPE to guide intensity level when exercising independently       Able to understand and use Dyspnea scale  Yes       Intervention  Provide education and explanation on how to use Dyspnea scale       Expected Outcomes  Short Term: Able to use Dyspnea scale daily in rehab to express subjective sense of shortness of breath during exertion;Long Term: Able to use Dyspnea scale to guide intensity level when exercising independently       Knowledge and understanding of Target Heart Rate Range (THRR)  Yes       Intervention  Provide education and explanation of THRR including how the numbers were predicted and where they are located for reference       Expected Outcomes  Short Term: Able to state/look up THRR;Long Term: Able to use THRR to govern intensity when exercising independently;Short Term: Able to use daily as guideline for intensity in rehab       Able to check pulse independently  Yes       Intervention  Provide education and demonstration on how to check pulse in carotid and radial arteries.;Review the importance of being able to check your own pulse for safety during independent exercise       Expected Outcomes  Short Term: Able to explain why pulse checking is important during independent exercise;Long Term: Able to check pulse independently and accurately       Understanding of Exercise Prescription  Yes       Intervention  Provide education, explanation, and written materials on patient's individual exercise prescription       Expected Outcomes  Short Term: Able to explain program exercise prescription;Long Term: Able to explain home exercise prescription to exercise independently          Nutrition & Weight - Outcomes: Pre Biometrics - 08/12/17 1405      Pre Biometrics   Height  5' 10.75" (1.797 m)    Weight  171 lb 12.8 oz (77.9 kg)    Waist Circumference  36 inches    Hip Circumference  40 inches    Waist to Hip  Ratio  0.9 %    BMI (Calculated)  24.13    Single Leg Stand  10.5 seconds      Post Biometrics - 10/11/17 1754       Post  Biometrics   Height  5' 10.75" (1.797 m)    Weight  174 lb (78.9 kg)    Waist Circumference  36 inches    Hip Circumference  39 inches    Waist  to Hip Ratio  0.92 %    BMI (Calculated)  24.44    Single Leg Stand  17.67 seconds       Nutrition: Nutrition Therapy & Goals - 08/12/17 1412      Intervention Plan   Intervention  Prescribe, educate and counsel regarding individualized specific dietary modifications aiming towards targeted core components such as weight, hypertension, lipid management, diabetes, heart failure and other comorbidities.;Nutrition handout(s) given to patient.    Expected Outcomes  Short Term Goal: Understand basic principles of dietary content, such as calories, fat, sodium, cholesterol and nutrients.;Short Term Goal: A plan has been developed with personal nutrition goals set during dietitian appointment.;Long Term Goal: Adherence to prescribed nutrition plan.       Nutrition Discharge: Nutrition Assessments - 08/12/17 1149      MEDFICTS Scores   Pre Score  32       Education Questionnaire Score: Knowledge Questionnaire Score - 08/12/17 1148      Knowledge Questionnaire Score   Pre Score  26/28 correct answers reviewed with Jenny Reichmann       Goals reviewed with patient; copy given to patient.

## 2017-10-14 NOTE — Progress Notes (Signed)
Daily Session Note  Patient Details  Name: Omar Kennedy MRN: [id number] Date of Birth: [birth year] Referring Provider:     Cardiac Rehab from 08/12/2017 in [facility] Cardiac [facility] Pul[facility] Rehab  Referring Provider  Henke      Encounter Date: 10/14/2017  Check In: Session Check In - 10/14/17 1634      Check-In   Location  [facility]-Cardiac & Pulmonary Rehab    Staff Present  Justin Mend RCP,RRT,BSRT;Meredith Sherryll Burger, RN BSN;Laureen Janell Quiet, RRT, Respiratory Therapist    Supervising physician immediately available to respond to emergencies  See telemetry face sheet for immediately available ER MD    Medication changes reported      No    Fall or balance concerns reported     No    Tobacco Cessation  No Change    Warm-up and Cool-down  Performed on first and last piece of equipment    Resistance Training Performed  Yes    VAD Patient?  No      Pain Assessment   Currently in Pain?  No/denies          Social History   Tobacco Use  Smoking Status Former Smoker  . Last attempt to quit: 1980  . Years since quitting: 39.2  Smokeless Tobacco Never Used    Goals Met:  Proper associated with RPD/PD & O2 Sat Independence with exercise equipment Exercise tolerated well No report of cardiac concerns or symptoms Strength training completed today  Goals Unmet:  Not Applicable  Comments:  Omar Kennedy graduated today from  rehab with 34 sessions completed.  Details of the patient's exercise prescription and what He needs to do in order to continue the prescription and progress were discussed with patient.  Patient was given a copy of prescription and goals.  Patient verbalized understanding.  Omar Kennedy plans to continue to exercise by joining the [employer] with his wife.   Dr. Emily Filbert is Medical Director for [facility].

## 2017-10-14 NOTE — Patient Instructions (Signed)
Discharge Patient Instructions  Patient Details  Name: Omar Kennedy MRN: [id number] Date of Birth: 18-Jun-1946 Referring Provider:  Tamsen Roers, MD   Number of Visits: 38  Reason for Discharge:  Patient reached a stable level of exercise. Patient independent in their exercise. Patient has met program and personal goals.  Smoking History:  Social History   Tobacco Use  Smoking Status Former Smoker  . Last attempt to quit: 1980  . Years since quitting: 39.2  Smokeless Tobacco Never Used    Diagnosis:  S/P CABG x 4  Initial Exercise Prescription: Initial Exercise Prescription - 08/12/17 1400      Date of Initial Exercise RX and Referring Provider   Date  08/12/17    Referring Provider  Henke      Treadmill   MPH  2.8    Grade  2    Minutes  15    METs  4      Recumbant Bike   Level  4    RPM  60    Watts  50    Minutes  15    METs  4      T5 Nustep   Level  3    SPM  80    Minutes  15    METs  4      Prescription Details   Frequency (times per week)  3    Duration  Progress to 45 minutes of aerobic exercise without signs/symptoms of physical distress      Intensity   THRR 40-80% of Max Heartrate  96-131    Ratings of Perceived Exertion  11-13    Perceived Dyspnea  0-4      Resistance Training   Training Prescription  Yes    Weight  4 lb    Reps  10-15       Discharge Exercise Prescription (Final Exercise Prescription Changes): Exercise Prescription Changes - 10/06/17 1500      Response to Exercise   Blood Pressure (Admit)  120/70    Blood Pressure (Exercise)  144/64    Blood Pressure (Exit)  124/60    Heart Rate (Admit)  55 bpm    Heart Rate (Exercise)  99 bpm    Heart Rate (Exit)  57 bpm    Rating of Perceived Exertion (Exercise)  11    Symptoms  none    Duration  Continue with 45 min of aerobic exercise without signs/symptoms of physical distress.    Intensity  THRR unchanged      Progression   Progression  Continue to progress  workloads to maintain intensity without signs/symptoms of physical distress.    Average METs  4      Resistance Training   Training Prescription  Yes    Weight  4 lb    Reps  10-15      Interval Training   Interval Training  No      Recumbant Bike   Level  5    RPM  60    Watts  49    Minutes  15    METs  3.96      REL-XR   Level  5    Minutes  15    METs  3.3      Home Exercise Plan   Plans to continue exercise at  Home (comment) walk    Frequency  Add 2 additional days to program exercise sessions.    Initial Home Exercises Provided  09/20/17  Functional Capacity: 6 Minute Walk    Row Name 08/12/17 1407 10/11/17 1751       6 Minute Walk   Phase  -  Discharge    Distance  1500 feet  1570 feet    Distance % Change  -  4.6 %    Distance Feet Change  -  70 ft    Walk Time  6 minutes  6 minutes    # of Rest Breaks  0  0    MPH  2.84  2.97    METS  3.99  3.47    RPE  8  11    Perceived Dyspnea   0  -    VO2 Peak  13.97  12.15    Symptoms  No  No    Resting HR  62 bpm  60 bpm    Resting BP  132/60  132/68    Resting Oxygen Saturation   100 %  -    Exercise Oxygen Saturation  during 6 min walk  99 %  99 %    Max Ex. HR  117 bpm  69 bpm    Max Ex. BP  172/72  170/80    2 Minute Post BP  136/66  -       Quality of Life: Quality of Life - 08/12/17 1148      Quality of Life Scores   Health/Function Pre  21 %    Socioeconomic Pre  20.38 %    Psych/Spiritual Pre  20.71 %    Family Pre  21 %    GLOBAL Pre  20.8 %       Personal Goals: Goals established at orientation with interventions provided to work toward goal. Personal Goals and Risk Factors at Admission - 08/12/17 1410      Core Components/Risk Factors/Patient Goals on Admission    Weight Management  Weight Maintenance;Yes    Intervention  Weight Management: Develop a combined nutrition and exercise program designed to reach desired caloric intake, while maintaining appropriate intake of  nutrient and fiber, sodium and fats, and appropriate energy expenditure required for the weight goal.;Weight Management: Provide education and appropriate resources to help participant work on and attain dietary goals.;Weight Management/Obesity: Establish reasonable short term and long term weight goals.    Admit Weight  172 lb (78 kg)    Expected Outcomes  Short Term: Continue to assess and modify interventions until short term weight is achieved;Long Term: Adherence to nutrition and physical activity/exercise program aimed toward attainment of established weight goal;Weight Maintenance: Understanding of the daily nutrition guidelines, which includes 25-35% calories from fat, 7% or less cal from saturated fats, less than 237m cholesterol, less than 1.5gm of sodium, & 5 or more servings of fruits and vegetables daily;Understanding recommendations for meals to include 15-35% energy as protein, 25-35% energy from fat, 35-60% energy from carbohydrates, less than 2027mof dietary cholesterol, 20-35 gm of total fiber daily;Understanding of distribution of calorie intake throughout the day with the consumption of 4-5 meals/snacks    Hypertension  Yes    Intervention  Provide education on lifestyle modifcations including regular physical activity/exercise, weight management, moderate sodium restriction and increased consumption of fresh fruit, vegetables, and low fat dairy, alcohol moderation, and smoking cessation.;Monitor prescription use compliance.    Expected Outcomes  Short Term: Continued assessment and intervention until BP is < 140/9046mG in hypertensive participants. < 130/25m65m in hypertensive participants with diabetes, heart failure or chronic kidney disease.;Long  Term: Maintenance of blood pressure at goal levels.    Lipids  Yes    Intervention  Provide education and support for participant on nutrition & aerobic/resistive exercise along with prescribed medications to achieve LDL '70mg'$ , HDL >'40mg'$ .     Expected Outcomes  Short Term: Participant states understanding of desired cholesterol values and is compliant with medications prescribed. Participant is following exercise prescription and nutrition guidelines.;Long Term: Cholesterol controlled with medications as prescribed, with individualized exercise RX and with personalized nutrition plan. Value goals: LDL < '70mg'$ , HDL > 40 mg.        Personal Goals Discharge: Goals and Risk Factor Review - 10/06/17 1513      Core Components/Risk Factors/Patient Goals Review   Personal Goals Review  Weight Management/Obesity;Lipids    Review  Omar Kennedy's weight has been stable. His blood pressure has been very good and he eats healthy so he expects his lipid blood work to be fine.     Expected Outcomes  Heart healthy living with his wife and exercise with her using his Silver Sneakers benefit.        Exercise Goals and Review: Exercise Goals    Row Name 08/12/17 1406             Exercise Goals   Increase Physical Activity  Yes       Intervention  Provide advice, education, support and counseling about physical activity/exercise needs.;Develop an individualized exercise prescription for aerobic and resistive training based on initial evaluation findings, risk stratification, comorbidities and participant's personal goals.       Expected Outcomes  Short Term: Attend rehab on a regular basis to increase amount of physical activity.;Long Term: Add in home exercise to make exercise part of routine and to increase amount of physical activity.;Long Term: Exercising regularly at least 3-5 days a week.       Increase Strength and Stamina  Yes       Intervention  Provide advice, education, support and counseling about physical activity/exercise needs.;Develop an individualized exercise prescription for aerobic and resistive training based on initial evaluation findings, risk stratification, comorbidities and participant's personal goals.       Expected Outcomes   Short Term: Increase workloads from initial exercise prescription for resistance, speed, and METs.;Short Term: Perform resistance training exercises routinely during rehab and add in resistance training at home;Long Term: Improve cardiorespiratory fitness, muscular endurance and strength as measured by increased METs and functional capacity (6MWT)       Able to understand and use rate of perceived exertion (RPE) scale  Yes       Intervention  Provide education and explanation on how to use RPE scale       Expected Outcomes  Short Term: Able to use RPE daily in rehab to express subjective intensity level;Long Term:  Able to use RPE to guide intensity level when exercising independently       Able to understand and use Dyspnea scale  Yes       Intervention  Provide education and explanation on how to use Dyspnea scale       Expected Outcomes  Short Term: Able to use Dyspnea scale daily in rehab to express subjective sense of shortness of breath during exertion;Long Term: Able to use Dyspnea scale to guide intensity level when exercising independently       Knowledge and understanding of Target Heart Rate Range (THRR)  Yes       Intervention  Provide education and explanation of THRR including  how the numbers were predicted and where they are located for reference       Expected Outcomes  Short Term: Able to state/look up THRR;Long Term: Able to use THRR to govern intensity when exercising independently;Short Term: Able to use daily as guideline for intensity in rehab       Able to check pulse independently  Yes       Intervention  Provide education and demonstration on how to check pulse in carotid and radial arteries.;Review the importance of being able to check your own pulse for safety during independent exercise       Expected Outcomes  Short Term: Able to explain why pulse checking is important during independent exercise;Long Term: Able to check pulse independently and accurately        Understanding of Exercise Prescription  Yes       Intervention  Provide education, explanation, and written materials on patient's individual exercise prescription       Expected Outcomes  Short Term: Able to explain program exercise prescription;Long Term: Able to explain home exercise prescription to exercise independently          Nutrition & Weight - Outcomes: Pre Biometrics - 08/12/17 1405      Pre Biometrics   Height  5' 10.75" (1.797 m)    Weight  171 lb 12.8 oz (77.9 kg)    Waist Circumference  36 inches    Hip Circumference  40 inches    Waist to Hip Ratio  0.9 %    BMI (Calculated)  24.13    Single Leg Stand  10.5 seconds      Post Biometrics - 10/11/17 1754       Post  Biometrics   Height  5' 10.75" (1.797 m)    Weight  174 lb (78.9 kg)    Waist Circumference  36 inches    Hip Circumference  39 inches    Waist to Hip Ratio  0.92 %    BMI (Calculated)  24.44    Single Leg Stand  17.67 seconds       Nutrition: Nutrition Therapy & Goals - 08/12/17 1412      Intervention Plan   Intervention  Prescribe, educate and counsel regarding individualized specific dietary modifications aiming towards targeted core components such as weight, hypertension, lipid management, diabetes, heart failure and other comorbidities.;Nutrition handout(s) given to patient.    Expected Outcomes  Short Term Goal: Understand basic principles of dietary content, such as calories, fat, sodium, cholesterol and nutrients.;Short Term Goal: A plan has been developed with personal nutrition goals set during dietitian appointment.;Long Term Goal: Adherence to prescribed nutrition plan.       Nutrition Discharge: Nutrition Assessments - 08/12/17 1149      MEDFICTS Scores   Pre Score  32       Education Questionnaire Score: Knowledge Questionnaire Score - 08/12/17 1148      Knowledge Questionnaire Score   Pre Score  26/28 correct answers reviewed with Jenny Reichmann       Goals reviewed with  patient; copy given to patient.

## 2017-10-18 ENCOUNTER — Encounter: Payer: Medicare Other | Attending: Internal Medicine

## 2017-10-18 DIAGNOSIS — Z951 Presence of aortocoronary bypass graft: Secondary | ICD-10-CM | POA: Insufficient documentation

## 2017-10-18 NOTE — Patient Instructions (Signed)
Discharge Patient Instructions  Patient Details  Name: Omar Kennedy MRN: [id number] Date of Birth: 1946-05-10 Referring Provider:  Mamie Nick, MD   Number of Visits: 54  Reason for Discharge:  Patient reached a stable level of exercise. Patient independent in their exercise. Patient has met program and personal goals.  Smoking History:  Social History   Tobacco Use  Smoking Status Former Smoker  . Last attempt to quit: 1980  . Years since quitting: 39.2  Smokeless Tobacco Never Used    Diagnosis:  S/P CABG x 4  Initial Exercise Prescription: Initial Exercise Prescription - 08/12/17 1400      Date of Initial Exercise RX and Referring Provider   Date  08/12/17    Referring Provider  Henke      Treadmill   MPH  2.8    Grade  2    Minutes  15    METs  4      Recumbant Bike   Level  4    RPM  60    Watts  50    Minutes  15    METs  4      T5 Nustep   Level  3    SPM  80    Minutes  15    METs  4      Prescription Details   Frequency (times per week)  3    Duration  Progress to 45 minutes of aerobic exercise without signs/symptoms of physical distress      Intensity   THRR 40-80% of Max Heartrate  96-131    Ratings of Perceived Exertion  11-13    Perceived Dyspnea  0-4      Resistance Training   Training Prescription  Yes    Weight  4 lb    Reps  10-15       Discharge Exercise Prescription (Final Exercise Prescription Changes): Exercise Prescription Changes - 10/06/17 1500      Response to Exercise   Blood Pressure (Admit)  120/70    Blood Pressure (Exercise)  144/64    Blood Pressure (Exit)  124/60    Heart Rate (Admit)  55 bpm    Heart Rate (Exercise)  99 bpm    Heart Rate (Exit)  57 bpm    Rating of Perceived Exertion (Exercise)  11    Symptoms  none    Duration  Continue with 45 min of aerobic exercise without signs/symptoms of physical distress.    Intensity  THRR unchanged      Progression   Progression  Continue to progress  workloads to maintain intensity without signs/symptoms of physical distress.    Average METs  4      Resistance Training   Training Prescription  Yes    Weight  4 lb    Reps  10-15      Interval Training   Interval Training  No      Recumbant Bike   Level  5    RPM  60    Watts  49    Minutes  15    METs  3.96      REL-XR   Level  5    Minutes  15    METs  3.3      Home Exercise Plan   Plans to continue exercise at  Home (comment) walk    Frequency  Add 2 additional days to program exercise sessions.    Initial Home Exercises Provided  09/20/17  Functional Capacity: 6 Minute Walk    Row Name 08/12/17 1407 10/11/17 1751       6 Minute Walk   Phase  -  Discharge    Distance  1500 feet  1570 feet    Distance % Change  -  4.6 %    Distance Feet Change  -  70 ft    Walk Time  6 minutes  6 minutes    # of Rest Breaks  0  0    MPH  2.84  2.97    METS  3.99  3.47    RPE  8  11    Perceived Dyspnea   0  -    VO2 Peak  13.97  12.15    Symptoms  No  No    Resting HR  62 bpm  60 bpm    Resting BP  132/60  132/68    Resting Oxygen Saturation   100 %  -    Exercise Oxygen Saturation  during 6 min walk  99 %  99 %    Max Ex. HR  117 bpm  69 bpm    Max Ex. BP  172/72  170/80    2 Minute Post BP  136/66  -       Quality of Life: Quality of Life - 08/12/17 1148      Quality of Life Scores   Health/Function Pre  21 %    Socioeconomic Pre  20.38 %    Psych/Spiritual Pre  20.71 %    Family Pre  21 %    GLOBAL Pre  20.8 %       Personal Goals: Goals established at orientation with interventions provided to work toward goal. Personal Goals and Risk Factors at Admission - 08/12/17 1410      Core Components/Risk Factors/Patient Goals on Admission    Weight Management  Weight Maintenance;Yes    Intervention  Weight Management: Develop a combined nutrition and exercise program designed to reach desired caloric intake, while maintaining appropriate intake of  nutrient and fiber, sodium and fats, and appropriate energy expenditure required for the weight goal.;Weight Management: Provide education and appropriate resources to help participant work on and attain dietary goals.;Weight Management/Obesity: Establish reasonable short term and long term weight goals.    Admit Weight  172 lb (78 kg)    Expected Outcomes  Short Term: Continue to assess and modify interventions until short term weight is achieved;Long Term: Adherence to nutrition and physical activity/exercise program aimed toward attainment of established weight goal;Weight Maintenance: Understanding of the daily nutrition guidelines, which includes 25-35% calories from fat, 7% or less cal from saturated fats, less than 237m cholesterol, less than 1.5gm of sodium, & 5 or more servings of fruits and vegetables daily;Understanding recommendations for meals to include 15-35% energy as protein, 25-35% energy from fat, 35-60% energy from carbohydrates, less than 2027mof dietary cholesterol, 20-35 gm of total fiber daily;Understanding of distribution of calorie intake throughout the day with the consumption of 4-5 meals/snacks    Hypertension  Yes    Intervention  Provide education on lifestyle modifcations including regular physical activity/exercise, weight management, moderate sodium restriction and increased consumption of fresh fruit, vegetables, and low fat dairy, alcohol moderation, and smoking cessation.;Monitor prescription use compliance.    Expected Outcomes  Short Term: Continued assessment and intervention until BP is < 140/9046mG in hypertensive participants. < 130/25m65m in hypertensive participants with diabetes, heart failure or chronic kidney disease.;Long  Term: Maintenance of blood pressure at goal levels.    Lipids  Yes    Intervention  Provide education and support for participant on nutrition & aerobic/resistive exercise along with prescribed medications to achieve LDL '70mg'$ , HDL >'40mg'$ .     Expected Outcomes  Short Term: Participant states understanding of desired cholesterol values and is compliant with medications prescribed. Participant is following exercise prescription and nutrition guidelines.;Long Term: Cholesterol controlled with medications as prescribed, with individualized exercise RX and with personalized nutrition plan. Value goals: LDL < '70mg'$ , HDL > 40 mg.        Personal Goals Discharge: Goals and Risk Factor Review - 10/06/17 1513      Core Components/Risk Factors/Patient Goals Review   Personal Goals Review  Weight Management/Obesity;Lipids    Review  Kaelon's weight has been stable. His blood pressure has been very good and he eats healthy so he expects his lipid blood work to be fine.     Expected Outcomes  Heart healthy living with his wife and exercise with her using his Silver Sneakers benefit.        Exercise Goals and Review: Exercise Goals    Row Name 08/12/17 1406             Exercise Goals   Increase Physical Activity  Yes       Intervention  Provide advice, education, support and counseling about physical activity/exercise needs.;Develop an individualized exercise prescription for aerobic and resistive training based on initial evaluation findings, risk stratification, comorbidities and participant's personal goals.       Expected Outcomes  Short Term: Attend rehab on a regular basis to increase amount of physical activity.;Long Term: Add in home exercise to make exercise part of routine and to increase amount of physical activity.;Long Term: Exercising regularly at least 3-5 days a week.       Increase Strength and Stamina  Yes       Intervention  Provide advice, education, support and counseling about physical activity/exercise needs.;Develop an individualized exercise prescription for aerobic and resistive training based on initial evaluation findings, risk stratification, comorbidities and participant's personal goals.       Expected Outcomes   Short Term: Increase workloads from initial exercise prescription for resistance, speed, and METs.;Short Term: Perform resistance training exercises routinely during rehab and add in resistance training at home;Long Term: Improve cardiorespiratory fitness, muscular endurance and strength as measured by increased METs and functional capacity (6MWT)       Able to understand and use rate of perceived exertion (RPE) scale  Yes       Intervention  Provide education and explanation on how to use RPE scale       Expected Outcomes  Short Term: Able to use RPE daily in rehab to express subjective intensity level;Long Term:  Able to use RPE to guide intensity level when exercising independently       Able to understand and use Dyspnea scale  Yes       Intervention  Provide education and explanation on how to use Dyspnea scale       Expected Outcomes  Short Term: Able to use Dyspnea scale daily in rehab to express subjective sense of shortness of breath during exertion;Long Term: Able to use Dyspnea scale to guide intensity level when exercising independently       Knowledge and understanding of Target Heart Rate Range (THRR)  Yes       Intervention  Provide education and explanation of THRR including  how the numbers were predicted and where they are located for reference       Expected Outcomes  Short Term: Able to state/look up THRR;Long Term: Able to use THRR to govern intensity when exercising independently;Short Term: Able to use daily as guideline for intensity in rehab       Able to check pulse independently  Yes       Intervention  Provide education and demonstration on how to check pulse in carotid and radial arteries.;Review the importance of being able to check your own pulse for safety during independent exercise       Expected Outcomes  Short Term: Able to explain why pulse checking is important during independent exercise;Long Term: Able to check pulse independently and accurately        Understanding of Exercise Prescription  Yes       Intervention  Provide education, explanation, and written materials on patient's individual exercise prescription       Expected Outcomes  Short Term: Able to explain program exercise prescription;Long Term: Able to explain home exercise prescription to exercise independently          Nutrition & Weight - Outcomes: Pre Biometrics - 08/12/17 1405      Pre Biometrics   Height  5' 10.75" (1.797 m)    Weight  171 lb 12.8 oz (77.9 kg)    Waist Circumference  36 inches    Hip Circumference  40 inches    Waist to Hip Ratio  0.9 %    BMI (Calculated)  24.13    Single Leg Stand  10.5 seconds      Post Biometrics - 10/11/17 1754       Post  Biometrics   Height  5' 10.75" (1.797 m)    Weight  174 lb (78.9 kg)    Waist Circumference  36 inches    Hip Circumference  39 inches    Waist to Hip Ratio  0.92 %    BMI (Calculated)  24.44    Single Leg Stand  17.67 seconds       Nutrition: Nutrition Therapy & Goals - 08/12/17 1412      Intervention Plan   Intervention  Prescribe, educate and counsel regarding individualized specific dietary modifications aiming towards targeted core components such as weight, hypertension, lipid management, diabetes, heart failure and other comorbidities.;Nutrition handout(s) given to patient.    Expected Outcomes  Short Term Goal: Understand basic principles of dietary content, such as calories, fat, sodium, cholesterol and nutrients.;Short Term Goal: A plan has been developed with personal nutrition goals set during dietitian appointment.;Long Term Goal: Adherence to prescribed nutrition plan.       Nutrition Discharge: Nutrition Assessments - 08/12/17 1149      MEDFICTS Scores   Pre Score  32       Education Questionnaire Score: Knowledge Questionnaire Score - 08/12/17 1148      Knowledge Questionnaire Score   Pre Score  26/28 correct answers reviewed with Jenny Reichmann       Goals reviewed with  patient; copy given to patient.

## 2018-01-29 ENCOUNTER — Ambulatory Visit (HOSPITAL_COMMUNITY)
Admission: EM | Admit: 2018-01-29 | Discharge: 2018-01-29 | Disposition: A | Discharge status: Home / Self Care | Payer: Medicare Other | Attending: Internal Medicine | Admitting: Internal Medicine

## 2018-01-29 ENCOUNTER — Encounter (HOSPITAL_COMMUNITY): Payer: Self-pay | Admitting: *Deleted

## 2018-01-29 DIAGNOSIS — S81812A Laceration without foreign body, left lower leg, initial encounter: Secondary | ICD-10-CM

## 2018-01-29 DIAGNOSIS — Z23 Encounter for immunization: Secondary | ICD-10-CM | POA: Diagnosis not present

## 2018-01-29 DIAGNOSIS — L089 Local infection of the skin and subcutaneous tissue, unspecified: Secondary | ICD-10-CM

## 2018-01-29 DIAGNOSIS — T148XXA Other injury of unspecified body region, initial encounter: Principal | ICD-10-CM

## 2018-01-29 HISTORY — DX: Pure hypercholesterolemia, unspecified: E78.00

## 2018-01-29 HISTORY — DX: Atherosclerotic heart disease of native coronary artery without angina pectoris: I25.10

## 2018-01-29 MED ORDER — TETANUS-DIPHTH-ACELL PERTUSSIS 5-2.5-18.5 LF-MCG/0.5 IM SUSP
0.5000 mL | Freq: Once | INTRAMUSCULAR | Status: AC
  Administered 2018-01-29: 0.5 mL via INTRAMUSCULAR

## 2018-01-29 MED ORDER — SULFAMETHOXAZOLE-TRIMETHOPRIM 800-160 MG PO TABS: 1.0000 | ORAL_TABLET | Freq: Two times a day (BID) | ORAL | 0 refills | Status: AC

## 2018-01-29 MED ORDER — TETANUS-DIPHTH-ACELL PERTUSSIS 5-2.5-18.5 LF-MCG/0.5 IM SUSP
INTRAMUSCULAR | Status: AC
  Filled 2018-01-29: qty 0.5

## 2018-01-29 NOTE — ED Provider Notes (Signed)
[facility]    CSN: [id number] Arrival date & time: 01/29/18  1630     History   Chief Complaint Chief Complaint  Patient presents with  . Laceration  . Wound Check    HPI Omar GuppyJohn S Kennedy is a 72 y.o. male.   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 1 week ago a computer monitor fell out of his car landing on his shin, and then sliding down, causing a skin tear. It has since become increasingly more red and with pus. No fevers. Mild tenderness but with minimal pain. Unknown last tetanus. Has been cleansing daily with hydrogen peroxide and applying neosporin. No known history of MRSA. Was in the hospital earlier this year for a bypass. Hx of CAD, htn   ROS per HPI.      Past Medical History:  Diagnosis Date  . CAD (coronary artery disease)   . Hypercholesteremia     Patient Active Problem List   Diagnosis Date Noted  . HTN (hypertension) 08/12/2017  . 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 Coronary artery disease involving native coronary artery of native heart 05/27/2017  . Elevated blood-pressure reading without diagnosis of hypertension 12/10/2015  . Former cigarette smoker 12/10/2015  . Hypercholesterolemia 12/10/2015  . 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/2015  . Nasal polyps 12/10/2015  . Poorly-controlled hypertension 12/10/2015  . Enlarged prostate 05/20/2011  . Insomnia 05/20/2011    Past Surgical History:  Procedure Laterality Date  . CARDIAC SURGERY    . CORONARY ARTERY BYPASS GRAFT    . HERNIA REPAIR    . umbilical surgery         Home Medications    Prior to Admission medications   Medication Sig Start Date End Date Taking? Authorizing Provider  aspirin EC 81 MG tablet Take by mouth. 05/26/17 05/26/18 Yes [provider]  carvedilol (COREG) 12.5 MG tablet Take 12.5 mg by mouth 2 (two) times daily with a meal. 08/02/17  Yes [provider]  Donepezil HCl (ARICEPT PO) Take by  mouth.   Yes [provider]  fluticasone (FLONASE) 50 MCG/ACT nasal spray Place 2 sprays into both nostrils daily. 04/02/17  Yes [provider]  lisinopril (PRINIVIL,ZESTRIL) 5 MG tablet Take 5 mg by mouth daily. 07/06/17  Yes [provider]  rosuvastatin (CRESTOR) 40 MG tablet TAKE ONE TABLET BY MOUTH AT NIGHT 07/23/17  Yes [provider]  Tamsulosin HCl (FLOMAX PO) Take by mouth.   Yes [provider]  acetaminophen (TYLENOL) 325 MG tablet Take by mouth. 06/21/17   [provider]  nitroGLYCERIN (NITROSTAT) 0.4 MG SL tablet PLEASE SEE ATTACHED FOR DETAILED DIRECTIONS 05/25/17   [provider]  senna-docusate (SENOKOT-S) 8.6-50 MG tablet Take by mouth. 06/21/17   [provider]  sulfamethoxazole-trimethoprim (BACTRIM DS) 800-160 MG tablet Take 1 tablet by mouth 2 (two) times daily for 10 days. 01/29/18 02/08/18  Georgetta HaberBurky, Zaide Mcclenahan B, NP    Family History History reviewed. No pertinent family history.  Social History Social History   Tobacco Use  . Smoking status: Former Smoker    Last attempt to quit: 1980    Years since quitting: 39.5  . Smokeless tobacco: Never Used  Substance Use Topics  . Alcohol use: Yes    Alcohol/week: 4.2 oz    Types: 7 Cans of beer per week  . Drug use: Not on file     Allergies   Tetracycline and  Amoxicillin-pot clavulanate   Review of Systems Review of Systems   Physical Exam Triage Vital Signs ED Triage Vitals [01/29/18 1656]  Enc Vitals Group     BP (!) 114/55     Pulse Rate (!) 55     Resp 16     Temp (!) 97.3 F (36.3 C)     Temp Source Oral     SpO2 98 %     Weight      Height      Head Circumference      Peak Flow      Pain Score      Pain Loc      Pain Edu?      Excl. in GC?    No data found.  Updated Vital Signs BP (!) 114/55   Pulse (!) 55   Temp (!) 97.3 F (36.3 C) (Oral)   Resp 16   SpO2 98%    Physical Exam  Constitutional: He is oriented to  person, place, and time. He appears well-developed and well-nourished.  Cardiovascular: Regular rhythm and normal heart sounds.  Pulmonary/Chest: Effort normal and breath sounds normal.  Neurological: He is alert and oriented to person, place, and time.  Skin: Skin is warm and dry.     Left shin wound with scab overlying open skin, redness surrounding; no active drainage; mild tenderness; see photo       UC Treatments / Results  Labs (all labs ordered are listed, but only abnormal results are displayed) Labs Reviewed - No data to display  EKG None  Radiology No results found.  Procedures Procedures (including critical care time)  Medications Ordered in UC Medications  Tdap (BOOSTRIX) injection 0.5 mL (has no administration in time range)    Initial Impression / Assessment and Plan / UC Course  I have reviewed the triage vital signs and the nursing notes.  Pertinent labs & imaging results that were available during my care of the patient were reviewed by me and considered in my medical decision making (see chart for details).     10 days of bactrim initiated at this time. Wound care discussed. Follow up here or with PCP in the next 3-5 days if worsening or no improvement. Patient verbalized understanding and agreeable to plan.    Final Clinical Impressions(s) / UC Diagnoses   Final diagnoses:  Post-traumatic wound infection     Discharge Instructions     Cleanse daily with soap and water.  Keep covered to keep clean.  Complete course of antibiotics.   If symptoms worsen or do not improve in the next 3-5 days in redness and drainage to return to be seen or to follow up with your PCP.     ED Prescriptions    Medication Sig Dispense Auth. Provider   sulfamethoxazole-trimethoprim (BACTRIM DS) 800-160 MG tablet Take 1 tablet by mouth 2 (two) times daily for 10 days. 20 tablet Georgetta Haber, NP     Controlled Substance Prescriptions [place] Controlled Substance  Registry consulted? Not Applicable   Georgetta Haber, NP 01/29/18 1753

## 2018-01-29 NOTE — ED Triage Notes (Signed)
Reports sustaining laceration to left shin 6 days ago.  Has been cleansing it with H2O2 and applying abx oint. Now wound becoming red, swelling to area into ankles.  Denies fevers.

## 2018-01-29 NOTE — Discharge Instructions (Signed)
Cleanse daily with soap and water.  Keep covered to keep clean.  Complete course of antibiotics.   If symptoms worsen or do not improve in the next 3-5 days in redness and drainage to return to be seen or to follow up with your PCP.

## 2018-04-28 ENCOUNTER — Other Ambulatory Visit (INDEPENDENT_AMBULATORY_CARE_PROVIDER_SITE_OTHER): Payer: Self-pay | Admitting: Otolaryngology

## 2018-04-28 DIAGNOSIS — J329 Chronic sinusitis, unspecified: Secondary | ICD-10-CM

## 2018-05-04 ENCOUNTER — Other Ambulatory Visit: Payer: Medicare Other

## 2018-05-06 ENCOUNTER — Ambulatory Visit
Admission: RE | Admit: 2018-05-06 | Discharge: 2018-05-06 | Disposition: A | Discharge status: Home / Self Care | Payer: Medicare Other | Source: Ambulatory Visit | Attending: Otolaryngology | Admitting: Otolaryngology

## 2018-05-06 DIAGNOSIS — J329 Chronic sinusitis, unspecified: Secondary | ICD-10-CM

## 2018-06-19 DIAGNOSIS — G4733 Obstructive sleep apnea (adult) (pediatric): Secondary | ICD-10-CM

## 2018-06-19 HISTORY — DX: Obstructive sleep apnea (adult) (pediatric): G47.33

## 2018-07-22 ENCOUNTER — Other Ambulatory Visit: Payer: Self-pay | Admitting: Otolaryngology

## 2018-07-25 ENCOUNTER — Encounter (HOSPITAL_BASED_OUTPATIENT_CLINIC_OR_DEPARTMENT_OTHER): Payer: Self-pay | Admitting: *Deleted

## 2018-07-26 ENCOUNTER — Other Ambulatory Visit: Payer: Self-pay

## 2018-07-26 ENCOUNTER — Encounter (HOSPITAL_BASED_OUTPATIENT_CLINIC_OR_DEPARTMENT_OTHER): Payer: Self-pay | Admitting: *Deleted

## 2018-07-26 NOTE — Progress Notes (Signed)
Patient saw his cardiologist Dr Gwen Pounds on 07-08-18 for cardiac clearance for surgery on 08-02-18 with Dr Suszanne Conners. The pt then c/o of some SOB , no C/P on that visit. Dr Gwen Pounds ordered a myocardia perfusion test and an ECHO which is being done on 07-29-18. Will await results of testing.

## 2018-08-02 ENCOUNTER — Ambulatory Visit (HOSPITAL_BASED_OUTPATIENT_CLINIC_OR_DEPARTMENT_OTHER)
Admission: RE | Admit: 2018-08-02 | Discharge: 2018-08-02 | Disposition: A | Discharge status: Home / Self Care | Payer: Medicare Other | Attending: Otolaryngology | Admitting: Otolaryngology

## 2018-08-02 ENCOUNTER — Other Ambulatory Visit: Payer: Self-pay

## 2018-08-02 ENCOUNTER — Encounter (HOSPITAL_BASED_OUTPATIENT_CLINIC_OR_DEPARTMENT_OTHER): Payer: Self-pay | Admitting: *Deleted

## 2018-08-02 ENCOUNTER — Ambulatory Visit (HOSPITAL_BASED_OUTPATIENT_CLINIC_OR_DEPARTMENT_OTHER): Payer: Medicare Other | Admitting: Anesthesiology

## 2018-08-02 ENCOUNTER — Encounter (HOSPITAL_BASED_OUTPATIENT_CLINIC_OR_DEPARTMENT_OTHER)
Admission: RE | Disposition: A | Discharge status: Home / Self Care | Payer: Self-pay | Source: Home / Self Care | Attending: Otolaryngology

## 2018-08-02 DIAGNOSIS — J322 Chronic ethmoidal sinusitis: Secondary | ICD-10-CM | POA: Diagnosis not present

## 2018-08-02 DIAGNOSIS — J342 Deviated nasal septum: Secondary | ICD-10-CM | POA: Insufficient documentation

## 2018-08-02 DIAGNOSIS — Z79899 Other long term (current) drug therapy: Secondary | ICD-10-CM | POA: Insufficient documentation

## 2018-08-02 DIAGNOSIS — J323 Chronic sphenoidal sinusitis: Secondary | ICD-10-CM | POA: Diagnosis not present

## 2018-08-02 DIAGNOSIS — J320 Chronic maxillary sinusitis: Secondary | ICD-10-CM | POA: Insufficient documentation

## 2018-08-02 DIAGNOSIS — J338 Other polyp of sinus: Secondary | ICD-10-CM | POA: Diagnosis not present

## 2018-08-02 DIAGNOSIS — J343 Hypertrophy of nasal turbinates: Secondary | ICD-10-CM | POA: Diagnosis present

## 2018-08-02 DIAGNOSIS — I2510 Atherosclerotic heart disease of native coronary artery without angina pectoris: Secondary | ICD-10-CM | POA: Diagnosis not present

## 2018-08-02 DIAGNOSIS — Z7951 Long term (current) use of inhaled steroids: Secondary | ICD-10-CM | POA: Diagnosis not present

## 2018-08-02 DIAGNOSIS — G4730 Sleep apnea, unspecified: Secondary | ICD-10-CM | POA: Insufficient documentation

## 2018-08-02 DIAGNOSIS — I10 Essential (primary) hypertension: Secondary | ICD-10-CM | POA: Diagnosis not present

## 2018-08-02 DIAGNOSIS — Z87891 Personal history of nicotine dependence: Secondary | ICD-10-CM | POA: Diagnosis not present

## 2018-08-02 DIAGNOSIS — J321 Chronic frontal sinusitis: Secondary | ICD-10-CM | POA: Diagnosis not present

## 2018-08-02 HISTORY — PX: ENDOSCOPIC CONCHA BULLOSA RESECTION: SHX6395

## 2018-08-02 HISTORY — DX: Hypertrophy of nasal turbinates: J34.3

## 2018-08-02 HISTORY — DX: Sleep apnea, unspecified: G47.30

## 2018-08-02 HISTORY — DX: Deviated nasal septum: J34.2

## 2018-08-02 HISTORY — DX: Essential (primary) hypertension: I10

## 2018-08-02 HISTORY — DX: Unspecified osteoarthritis, unspecified site: M19.90

## 2018-08-02 HISTORY — PX: NASAL SEPTOPLASTY W/ TURBINOPLASTY: SHX2070

## 2018-08-02 HISTORY — PX: FRONTAL SINUS EXPLORATION: SHX6591

## 2018-08-02 HISTORY — DX: Other amnesia: R41.3

## 2018-08-02 HISTORY — PX: SINUS ENDO WITH FUSION: SHX5329

## 2018-08-02 HISTORY — PX: MAXILLARY ANTROSTOMY: SHX2003

## 2018-08-02 HISTORY — PX: ETHMOIDECTOMY: SHX5197

## 2018-08-02 SURGERY — SEPTOPLASTY, NOSE, WITH NASAL TURBINATE REDUCTION
Anesthesia: General | Site: Nose | Laterality: Bilateral

## 2018-08-02 MED ORDER — LACTATED RINGERS IV SOLN
INTRAVENOUS | Status: DC
  Administered 2018-08-02 (×2): via INTRAVENOUS

## 2018-08-02 MED ORDER — PHENYLEPHRINE 40 MCG/ML (10ML) SYRINGE FOR IV PUSH (FOR BLOOD PRESSURE SUPPORT)
PREFILLED_SYRINGE | INTRAVENOUS | Status: AC
  Filled 2018-08-02: qty 10

## 2018-08-02 MED ORDER — ONDANSETRON HCL 4 MG/2ML IJ SOLN
INTRAMUSCULAR | Status: DC | PRN
  Administered 2018-08-02: 4 mg via INTRAVENOUS

## 2018-08-02 MED ORDER — OXYMETAZOLINE HCL 0.05 % NA SOLN
NASAL | Status: DC | PRN
  Administered 2018-08-02: 1 via TOPICAL

## 2018-08-02 MED ORDER — LIDOCAINE-EPINEPHRINE 1 %-1:100000 IJ SOLN
INTRAMUSCULAR | Status: DC | PRN
  Administered 2018-08-02: 1 mL

## 2018-08-02 MED ORDER — LIDOCAINE-EPINEPHRINE 1 %-1:100000 IJ SOLN
INTRAMUSCULAR | Status: AC
  Filled 2018-08-02: qty 1

## 2018-08-02 MED ORDER — FENTANYL CITRATE (PF) 100 MCG/2ML IJ SOLN
INTRAMUSCULAR | Status: AC
  Filled 2018-08-02: qty 2

## 2018-08-02 MED ORDER — MUPIROCIN 2 % EX OINT
TOPICAL_OINTMENT | CUTANEOUS | Status: DC | PRN
  Administered 2018-08-02: 1 via NASAL

## 2018-08-02 MED ORDER — MUPIROCIN 2 % EX OINT
TOPICAL_OINTMENT | CUTANEOUS | Status: AC
  Filled 2018-08-02: qty 22

## 2018-08-02 MED ORDER — SUCCINYLCHOLINE CHLORIDE 200 MG/10ML IV SOSY
PREFILLED_SYRINGE | INTRAVENOUS | Status: AC
  Filled 2018-08-02: qty 10

## 2018-08-02 MED ORDER — LIDOCAINE HCL (CARDIAC) PF 100 MG/5ML IV SOSY
PREFILLED_SYRINGE | INTRAVENOUS | Status: DC | PRN
  Administered 2018-08-02: 100 mg via INTRAVENOUS

## 2018-08-02 MED ORDER — FENTANYL CITRATE (PF) 100 MCG/2ML IJ SOLN
25.0000 ug | INTRAMUSCULAR | Status: DC | PRN
  Administered 2018-08-02: 25 ug via INTRAVENOUS

## 2018-08-02 MED ORDER — ROCURONIUM BROMIDE 100 MG/10ML IV SOLN
INTRAVENOUS | Status: DC | PRN
  Administered 2018-08-02: 50 mg via INTRAVENOUS

## 2018-08-02 MED ORDER — SODIUM CHLORIDE 0.9 % IV SOLN
INTRAVENOUS | Status: AC | PRN
  Administered 2018-08-02: 500 mL

## 2018-08-02 MED ORDER — ROCURONIUM BROMIDE 50 MG/5ML IV SOSY
PREFILLED_SYRINGE | INTRAVENOUS | Status: AC
  Filled 2018-08-02: qty 5

## 2018-08-02 MED ORDER — SUGAMMADEX SODIUM 200 MG/2ML IV SOLN
INTRAVENOUS | Status: AC
  Filled 2018-08-02: qty 2

## 2018-08-02 MED ORDER — CEFAZOLIN SODIUM-DEXTROSE 2-4 GM/100ML-% IV SOLN
INTRAVENOUS | Status: AC
  Filled 2018-08-02: qty 100

## 2018-08-02 MED ORDER — EPHEDRINE SULFATE 50 MG/ML IJ SOLN
INTRAMUSCULAR | Status: DC | PRN
  Administered 2018-08-02: 15 mg via INTRAVENOUS
  Administered 2018-08-02: 10 mg via INTRAVENOUS

## 2018-08-02 MED ORDER — FENTANYL CITRATE (PF) 100 MCG/2ML IJ SOLN
50.0000 ug | INTRAMUSCULAR | Status: AC | PRN
  Administered 2018-08-02 (×2): 25 ug via INTRAVENOUS
  Administered 2018-08-02: 50 ug via INTRAVENOUS
  Administered 2018-08-02: 100 ug via INTRAVENOUS

## 2018-08-02 MED ORDER — MEPERIDINE HCL 25 MG/ML IJ SOLN: 6.2500 mg | INTRAMUSCULAR | Status: DC | PRN

## 2018-08-02 MED ORDER — ONDANSETRON HCL 4 MG/2ML IJ SOLN
INTRAMUSCULAR | Status: AC
  Filled 2018-08-02: qty 2

## 2018-08-02 MED ORDER — MIDAZOLAM HCL 2 MG/2ML IJ SOLN: 1.0000 mg | INTRAMUSCULAR | Status: DC | PRN

## 2018-08-02 MED ORDER — DEXAMETHASONE SODIUM PHOSPHATE 10 MG/ML IJ SOLN
INTRAMUSCULAR | Status: AC
  Filled 2018-08-02: qty 1

## 2018-08-02 MED ORDER — PROPOFOL 10 MG/ML IV BOLUS
INTRAVENOUS | Status: DC | PRN
  Administered 2018-08-02: 150 mg via INTRAVENOUS

## 2018-08-02 MED ORDER — EPHEDRINE 5 MG/ML INJ
INTRAVENOUS | Status: AC
  Filled 2018-08-02: qty 10

## 2018-08-02 MED ORDER — CEFAZOLIN SODIUM-DEXTROSE 2-3 GM-%(50ML) IV SOLR
INTRAVENOUS | Status: DC | PRN
  Administered 2018-08-02: 2 g via INTRAVENOUS

## 2018-08-02 MED ORDER — ONDANSETRON HCL 4 MG/2ML IJ SOLN
INTRAMUSCULAR | Status: AC
  Filled 2018-08-02: qty 4

## 2018-08-02 MED ORDER — PROPOFOL 500 MG/50ML IV EMUL
INTRAVENOUS | Status: AC
  Filled 2018-08-02: qty 50

## 2018-08-02 MED ORDER — LEVOFLOXACIN 500 MG PO TABS: 500.0000 mg | ORAL_TABLET | Freq: Every day | ORAL | 0 refills | Status: AC

## 2018-08-02 MED ORDER — OXYCODONE HCL 5 MG PO TABS: 5.0000 mg | ORAL_TABLET | Freq: Once | ORAL | Status: DC | PRN

## 2018-08-02 MED ORDER — SUGAMMADEX SODIUM 200 MG/2ML IV SOLN
INTRAVENOUS | Status: DC | PRN
  Administered 2018-08-02: 200 mg via INTRAVENOUS

## 2018-08-02 MED ORDER — CIPROFLOXACIN-FLUOCINOLONE PF 0.3-0.025 % OT SOLN
OTIC | Status: AC
  Filled 2018-08-02: qty 1

## 2018-08-02 MED ORDER — OXYCODONE-ACETAMINOPHEN 5-325 MG PO TABS: 1.0000 | ORAL_TABLET | ORAL | 0 refills | Status: DC | PRN

## 2018-08-02 MED ORDER — LIDOCAINE 2% (20 MG/ML) 5 ML SYRINGE
INTRAMUSCULAR | Status: AC
  Filled 2018-08-02: qty 5

## 2018-08-02 MED ORDER — OXYMETAZOLINE HCL 0.05 % NA SOLN
NASAL | Status: AC
  Filled 2018-08-02: qty 15

## 2018-08-02 MED ORDER — OXYCODONE HCL 5 MG/5ML PO SOLN: 5.0000 mg | Freq: Once | ORAL | Status: DC | PRN

## 2018-08-02 MED ORDER — DEXAMETHASONE SODIUM PHOSPHATE 4 MG/ML IJ SOLN
INTRAMUSCULAR | Status: DC | PRN
  Administered 2018-08-02: 8 mg via INTRAVENOUS

## 2018-08-02 MED ORDER — SCOPOLAMINE 1 MG/3DAYS TD PT72: 1.0000 | MEDICATED_PATCH | Freq: Once | TRANSDERMAL | Status: DC | PRN

## 2018-08-02 SURGICAL SUPPLY — 67 items
ATTRACTOMAT 16X20 MAGNETIC DRP (DRAPES) IMPLANT
BLADE RAD40 ROTATE 4M 4 5PK (BLADE) IMPLANT
BLADE RAD60 ROTATE M4 4 5PK (BLADE) IMPLANT
BLADE ROTATE RAD 12 4 M4 (BLADE) IMPLANT
BLADE ROTATE RAD 40 4 M4 (BLADE) IMPLANT
BLADE ROTATE TRICUT 4X13 M4 (BLADE) ×2 IMPLANT
BLADE TRICUT ROTATE M4 4 5PK (BLADE) IMPLANT
BUR HS RAD FRONTAL 3 (BURR) IMPLANT
CANISTER SUC SOCK COL 7IN (MISCELLANEOUS) ×4 IMPLANT
CANISTER SUCT 1200ML W/VALVE (MISCELLANEOUS) ×2 IMPLANT
COAGULATOR SUCT 8FR VV (MISCELLANEOUS) ×2 IMPLANT
COAGULATOR SUCT SWTCH 10FR 6 (ELECTROSURGICAL) IMPLANT
COVER WAND RF STERILE (DRAPES) IMPLANT
DECANTER SPIKE VIAL GLASS SM (MISCELLANEOUS) IMPLANT
DRSG NASAL KENNEDY LMNT 8CM (GAUZE/BANDAGES/DRESSINGS) IMPLANT
DRSG NASOPORE 8CM (GAUZE/BANDAGES/DRESSINGS) IMPLANT
DRSG TELFA 3X8 NADH (GAUZE/BANDAGES/DRESSINGS) IMPLANT
ELECT REM PT RETURN 9FT ADLT (ELECTROSURGICAL) ×2
ELECTRODE REM PT RTRN 9FT ADLT (ELECTROSURGICAL) ×1 IMPLANT
GLOVE BIO SURGEON STRL SZ7 (GLOVE) ×1 IMPLANT
GLOVE BIO SURGEON STRL SZ7.5 (GLOVE) ×2 IMPLANT
GLOVE BIOGEL PI IND STRL 7.0 (GLOVE) IMPLANT
GLOVE BIOGEL PI IND STRL 7.5 (GLOVE) IMPLANT
GLOVE BIOGEL PI INDICATOR 7.0 (GLOVE) ×1
GLOVE BIOGEL PI INDICATOR 7.5 (GLOVE) ×1
GLOVE ECLIPSE 6.5 STRL STRAW (GLOVE) ×1 IMPLANT
GOWN STRL REUS W/ TWL LRG LVL3 (GOWN DISPOSABLE) ×2 IMPLANT
GOWN STRL REUS W/TWL LRG LVL3 (GOWN DISPOSABLE) ×6
HEMOSTAT SURGICEL 2X14 (HEMOSTASIS) IMPLANT
IV NS 1000ML (IV SOLUTION)
IV NS 1000ML BAXH (IV SOLUTION) IMPLANT
IV NS 500ML (IV SOLUTION)
IV NS 500ML BAXH (IV SOLUTION) ×2 IMPLANT
IV SET EXT 30 76VOL 4 MALE LL (IV SETS) IMPLANT
NDL HYPO 25X1 1.5 SAFETY (NEEDLE) ×1 IMPLANT
NDL PRECISIONGLIDE 27X1.5 (NEEDLE) ×1 IMPLANT
NDL SPNL 25GX3.5 QUINCKE BL (NEEDLE) IMPLANT
NEEDLE HYPO 25X1 1.5 SAFETY (NEEDLE) ×2 IMPLANT
NEEDLE PRECISIONGLIDE 27X1.5 (NEEDLE) IMPLANT
NEEDLE SPNL 25GX3.5 QUINCKE BL (NEEDLE) IMPLANT
NS IRRIG 1000ML POUR BTL (IV SOLUTION) ×2 IMPLANT
PACK BASIN DAY SURGERY FS (CUSTOM PROCEDURE TRAY) ×2 IMPLANT
PACK ENT DAY SURGERY (CUSTOM PROCEDURE TRAY) ×2 IMPLANT
PACKING NASAL EPIS 4X2.4 XEROG (MISCELLANEOUS) IMPLANT
PAD DRESSING TELFA 3X8 NADH (GAUZE/BANDAGES/DRESSINGS) IMPLANT
SLEEVE SCD COMPRESS KNEE MED (MISCELLANEOUS) ×1 IMPLANT
SOLUTION BUTLER CLEAR DIP (MISCELLANEOUS) ×3 IMPLANT
SPLINT NASAL AIRWAY SILICONE (MISCELLANEOUS) ×1 IMPLANT
SPONGE GAUZE 2X2 8PLY STRL LF (GAUZE/BANDAGES/DRESSINGS) ×2 IMPLANT
SPONGE NEURO XRAY DETECT 1X3 (DISPOSABLE) ×2 IMPLANT
SUCTION FRAZIER HANDLE 10FR (MISCELLANEOUS)
SUCTION TUBE FRAZIER 10FR DISP (MISCELLANEOUS) IMPLANT
SUT CHROMIC 4 0 P 3 18 (SUTURE) ×2 IMPLANT
SUT ETHILON 3 0 PS 1 (SUTURE) IMPLANT
SUT PLAIN 4 0 ~~LOC~~ 1 (SUTURE) ×2 IMPLANT
SUT PROLENE 3 0 PS 2 (SUTURE) ×2 IMPLANT
SUT VIC AB 4-0 P-3 18XBRD (SUTURE) IMPLANT
SUT VIC AB 4-0 P3 18 (SUTURE)
SYR 50ML LL SCALE MARK (SYRINGE) ×1 IMPLANT
TOWEL GREEN STERILE FF (TOWEL DISPOSABLE) ×2 IMPLANT
TRACKER ENT INSTRUMENT (MISCELLANEOUS) ×2 IMPLANT
TRACKER ENT PATIENT (MISCELLANEOUS) ×2 IMPLANT
TUBE CONNECTING 20X1/4 (TUBING) ×2 IMPLANT
TUBE SALEM SUMP 12R W/ARV (TUBING) IMPLANT
TUBE SALEM SUMP 16 FR W/ARV (TUBING) ×2 IMPLANT
TUBING STRAIGHTSHOT EPS 5PK (TUBING) ×2 IMPLANT
YANKAUER SUCT BULB TIP NO VENT (SUCTIONS) ×2 IMPLANT

## 2018-08-02 NOTE — Discharge Instructions (Signed)
°Post Anesthesia Home Care Instructions ° °Activity: °Get plenty of rest for the remainder of the day. A responsible individual must stay with you for 24 hours following the procedure.  °For the next 24 hours, DO NOT: °-Drive a car °-Operate machinery °-Drink alcoholic beverages °-Take any medication unless instructed by your physician °-Make any legal decisions or sign important papers. ° °Meals: °Start with liquid foods such as gelatin or soup. Progress to regular foods as tolerated. Avoid greasy, spicy, heavy foods. If nausea and/or vomiting occur, drink only clear liquids until the nausea and/or vomiting subsides. Call your physician if vomiting continues. ° °Special Instructions/Symptoms: °Your throat may feel dry or sore from the anesthesia or the breathing tube placed in your throat during surgery. If this causes discomfort, gargle with warm salt water. The discomfort should disappear within 24 hours. ° °If you had a scopolamine patch placed behind your ear for the management of post- operative nausea and/or vomiting: ° °1. The medication in the patch is effective for 72 hours, after which it should be removed.  Wrap patch in a tissue and discard in the trash. Wash hands thoroughly with soap and water. °2. You may remove the patch earlier than 72 hours if you experience unpleasant side effects which may include dry mouth, dizziness or visual disturbances. °3. Avoid touching the patch. Wash your hands with soap and water after contact with the patch. °  °------------- ° °POSTOPERATIVE INSTRUCTIONS FOR PATIENTS HAVING NASAL OR SINUS OPERATIONS °ACTIVITY: Restrict activity at home for the first two days, resting as much as possible. Light activity is best. You may usually return to work within a week. You should refrain from nose blowing, strenuous activity, or heavy lifting greater than 20lbs for a total of three weeks after your operation.  If sneezing cannot be avoided, sneeze with your mouth  open. °DISCOMFORT: You may experience a dull headache and pressure along with nasal congestion and discharge. These symptoms may be worse during the first week after the operation but may last as long as two to four weeks.  Please take Tylenol or the pain medication that has been prescribed for you. Do not take aspirin or aspirin containing medications since they may cause bleeding.  You may experience symptoms of post nasal drainage, nasal congestion, headaches and fatigue for two or three months after your operation.  °BLEEDING: You may have some blood tinged nasal drainage for approximately two weeks after the operation.  The discharge will be worse for the first week.  Please call our office at [phone] or go to the nearest hospital emergency room if you experience any of the following: heavy, bright red blood from your nose or mouth that lasts longer than ten minutes or coughing up or vomiting bright red blood or blood clots. °GENERAL CONSIDERATIONS: °1. A gauze dressing will be placed on your upper lip to absorb any drainage after the operation. You may need to change this several times a day.  If you do not have very much drainage, you may remove the dressing.  Remember that you may gently wipe your nose with a tissue and sniff in, but DO NOT blow your nose. °2. Please keep all of your postoperative appointments.  Your final results after the operation will depend on proper follow-up.  The initial visit is usually four to seven days after the operation.  During this visit, the remaining nasal packing and internal septal splints will be removed.  Your nasal and sinus cavities will be   cleaned.  During the second visit, your nasal and sinus cavities will be cleaned again. Have someone drive you to your first two postoperative appointments. We suggest that you take your prescribed pain medication about ½ hour prior to each of these two appointments.  °3. How you care for your nose after the operation will  influence the results that you obtain.  You should follow all directions, take your medication as prescribed, and call our office [phone] with any problems or questions. °4. You may be more comfortable sleeping with your head elevated on two pillows. °5. Do not take any medications that we have not prescribed or recommended. °WARNING SIGNS: if any of the following should occur, please call our office: °1. Bright red bleeding which lasts more than 10 minutes. °2. Persistent fever greater than 102F. °3. Persistent vomiting. °4. Severe and constant pain that is not relieved by prescribed pain medication. °5. Trauma to the nose. °6. Rash or unusual side effects from any medicines. ° °

## 2018-08-02 NOTE — Anesthesia Preprocedure Evaluation (Addendum)
Anesthesia Evaluation  Patient identified by MRN, date of birth, ID band Patient awake    Reviewed: Allergy & Precautions, NPO status , Patient's Chart, lab work & pertinent test results, reviewed documented beta blocker date and time   Airway Mallampati: II  TM Distance: >3 FB Neck ROM: Full    Dental  (+) Dental Advisory Given   Pulmonary sleep apnea , former smoker,    Pulmonary exam normal breath sounds clear to auscultation       Cardiovascular hypertension, Pt. on medications and Pt. on home beta blockers + CAD  Normal cardiovascular exam Rhythm:Regular Rate:Normal     Neuro/Psych negative neurological ROS  negative psych ROS   GI/Hepatic negative GI ROS, Neg liver ROS,   Endo/Other  negative endocrine ROS  Renal/GU negative Renal ROS     Musculoskeletal  (+) Arthritis ,   Abdominal   Peds  Hematology negative hematology ROS (+)   Anesthesia Other Findings   Reproductive/Obstetrics negative OB ROS                           Anesthesia Physical Anesthesia Plan  ASA: III  Anesthesia Plan: General   Post-op Pain Management:    Induction: Intravenous  PONV Risk Score and Plan: 4 or greater and Ondansetron, Dexamethasone and Treatment may vary due to age or medical condition  Airway Management Planned: Oral ETT  Additional Equipment: None  Intra-op Plan:   Post-operative Plan: Extubation in OR  Informed Consent: I have reviewed the patients History and Physical, chart, labs and discussed the procedure including the risks, benefits and alternatives for the proposed anesthesia with the patient or authorized representative who has indicated his/her understanding and acceptance.   Dental advisory given  Plan Discussed with: CRNA  Anesthesia Plan Comments:         Anesthesia Quick Evaluation

## 2018-08-02 NOTE — Anesthesia Procedure Notes (Signed)
Procedure Name: Intubation Date/Time: 08/02/2018 8:59 AM Performed by: Willa Frater, CRNA Pre-anesthesia Checklist: Patient identified, Emergency Drugs available, Suction available and Patient being monitored Patient Re-evaluated:Patient Re-evaluated prior to induction Oxygen Delivery Method: Circle system utilized Preoxygenation: Pre-oxygenation with 100% oxygen Induction Type: IV induction Ventilation: Mask ventilation without difficulty Laryngoscope Size: Glidescope, Mac and 3 Grade View: Grade II Tube type: Oral Number of attempts: 1 Airway Equipment and Method: Stylet and Oral airway Placement Confirmation: ETT inserted through vocal cords under direct vision,  positive ETCO2 and breath sounds checked- equal and bilateral Secured at: 23 cm Tube secured with: Tape Dental Injury: Teeth and Oropharynx as per pre-operative assessment  Difficulty Due To: Difficulty was anticipated and Difficult Airway- due to reduced neck mobility

## 2018-08-02 NOTE — Op Note (Signed)
DATE OF PROCEDURE: 08/02/2018  OPERATIVE REPORT   SURGEON: Newman PiesSu Estephany Perot, MD   PREOPERATIVE DIAGNOSES:  1. Nasal septal deviation.  2. Bilateral inferior turbinate hypertrophy.  3. Chronic nasal obstruction. 4. Bilateral concha bullosa. 5. Bilateral chronic rhinosinusitis and polyposis.  POSTOPERATIVE DIAGNOSES:  1. Nasal septal deviation.  2. Bilateral inferior turbinate hypertrophy.  3. Chronic nasal obstruction. 4. Bilateral concha bullosa. 5. Bilateral chronic rhinosinusitis and polyposis.  PROCEDURE PERFORMED:  1. Bilateral endoscopic frontal sinusotomy with polyp removal. 2. Bilateral endoscopic total ethmoidectomy and sphenoidotomy. 3. Bilateral endoscopic maxillary antrostomy with polyp removal. 4. Septoplasty.  5. Bilateral partial inferior turbinate resection.  6. Bilateral endoscopic concha bullosa resection. 7. FUSION stereotactic image guidance.  ANESTHESIA: General endotracheal tube anesthesia.   COMPLICATIONS: None.   ESTIMATED BLOOD LOSS: 100 mL.   INDICATION FOR PROCEDURE: Omar Kennedy is a 73 y.o. male with a history of chronic nasal obstruction and recurrent rhinosinusitis. The patient was previously treated with allergy medications, steroid nasal spray, antibiotics, and systemic steroids. However, the patient continued to be symptomatic. On examination, the patient was noted to have bilateral severe inferior turbinate hypertrophy and nasal septal spurs, causing significant nasal obstruction. His CT scan showed bilateral large concha bullosa. Polypoid tissue was noted to obstruct his ostiomeatal complexes bilaterally, ethmoid air cells, frontal recesses, and the sphenoid openings. Based on the above findings, the decision was made for the patient to undergo the above-stated procedures. The risks, benefits, alternatives, and details of the procedures were discussed with the patient. Questions were invited and answered. Informed consent was obtained.   DESCRIPTION OF  PROCEDURE: The patient was taken to the operating room and placed supine on the operating table. General endotracheal tube anesthesia was administered by the anesthesiologist. The patient was positioned, and prepped and draped in the standard fashion for nasal surgery. Pledgets soaked with Afrin were placed in both nasal cavities for decongestion. The pledgets were subsequently removed. The FUSION stereotactic image guidance marker was placed. The image guidance system was functional throughout the case.  The above mentioned septal deviation and septal spur was again noted. 1% lidocaine with 1:100,000 epinephrine was injected onto the nasal septum bilaterally. A hemitransfixion incision was made on the left side. The mucosal flap was carefully elevated on the left side. A cartilaginous incision was made 1 cm superior to the caudal margin of the nasal septum. Mucosal flap was also elevated on the right side in the similar fashion. The deviated portion of the cartilaginous and bony septum were removed in piecemeal fashion. Once the deviated portions were removed, a straight midline septum was achieved. The septum was then quilted with 4-0 plain gut sutures. The hemitransfixion incision was closed with interrupted 4-0 chromic sutures.   The inferior one half of both hypertrophied inferior turbinate was crossclamped with a Kelly clamp. The inferior one half of each inferior turbinate was then resected with a pair of cross cutting scissors. Hemostasis was achieved with a suction cautery device.   Using a 0 endoscope, the left nasal cavity was examined. A large concha bullosa was noted. Using Tru-Cut forceps, the inferior one third and medial one half of the concha bullosa was resected. Polypoid tissue was noted within the middle meatus. The polypoid tissue was removed using a combination of microdebrider and Blakesley forceps. The uncinate process was resected with a freer elevator. The maxillary antrum was entered  and enlarged using a combination of backbiter and microdebrider. Polypoid tissue was removed from the left maxillary sinus.  Attention was then focused on the ethmoid sinuses. The bony partitions of the anterior and posterior ethmoid cavities were taken down. Polypoid tissue was noted and removed.  More polyps were noted to obstruct the left sphenoid opening. The polyps were removed. The sphenoid opening was entered and enlarged. No pathology was noted within the sphenoid sinus. Attention was then focused on the frontal sinus. The frontal recess was identified and enlarged by removing the surrounding bony partitions. Polypoid tissue was removed from the frontal recess. All 4 paranasal sinuses were copiously irrigated with saline solution.  The same procedure was repeated on the right side without exception. More polyps were noted on the right side. All polyps were removed. No purulent drainage was noted today. Doyle splints were applied to the nasal septum.  The care of the patient was turned over to the anesthesiologist. The patient was awakened from anesthesia without difficulty. The patient was extubated and transferred to the recovery room in good condition.   OPERATIVE FINDINGS: Bilateral chronic rhinosinusitis with polyposis. Nasal septal spur and bilateral inferior turbinate hypertrophy.   SPECIMEN: Bilateral sinus contents.  FOLLOWUP CARE: The patient be discharged home once he is awake and alert. The patient will be placed on Percocet 1 tablets p.o. q. 4 hours p.r.n. pain, and levofloxacin 500mg  po daily for 3 days. The patient will follow up in my office later this week for splint removal.   [name] Philomena Doheny, MD

## 2018-08-02 NOTE — Transfer of Care (Signed)
Immediate Anesthesia Transfer of Care Note  Patient: Omar Kennedy  Procedure(s) Performed: NASAL SEPTOPLASTY WITH BILATERAL TURBINATE REDUCTION (Bilateral Nose) ENDOSCOPIC BILATERAL CONCHA BULLOSA RESECTION (Bilateral Nose) ENDOSCOPIC BILATERAL MAXILLARY ANTROSTOMY WITH TISSUE REMOVAL (Bilateral Nose) ENDOSCOPIC BILATERAL FRONTAL RECESS EXPLORATION (Bilateral Nose) TOTAL ETHMOIDECTOMY AND SPHENOIDECTOMY (Bilateral Nose) SINUS ENDO WITH FUSION (Bilateral Nose)  Patient Location: PACU  Anesthesia Type:General  Level of Consciousness: awake and confused  Airway & Oxygen Therapy: Patient Spontanous Breathing and Patient connected to face mask oxygen  Post-op Assessment: Report given to RN and Post -op Vital signs reviewed and stable  Post vital signs: Reviewed and stable  Last Vitals:  Vitals Value Taken Time  BP    Temp    Pulse 79 08/02/2018 11:15 AM  Resp 15 08/02/2018 11:15 AM  SpO2 99 % 08/02/2018 11:15 AM  Vitals shown include unvalidated device data.  Last Pain:  Vitals:   08/02/18 0805  TempSrc: Oral  PainSc: 0-No pain         Complications: No apparent anesthesia complications

## 2018-08-02 NOTE — H&P (Signed)
Cc: Recurrent rhinosinusitis, chronic nasal obstruction  HPI: The patient is a 73 year old male who returns today for his follow-up evaluation.  The patient has a history of recurrent rhinosinusitis and chronic nasal obstruction.  He was previously noted to have nasal septal deviation, bilateral inferior turbinate hypertrophy, and bilateral large concha bullosa.  He was also noted to have polypoid tissue obstructing his nasal cavities bilaterally.  At his last visit, the patient was placed on Prednisone Dosepak for 12 days.  The patient returns today reporting slight improvement in his nasal obstruction. However he continues to have difficulty breathing through his nostrils, especially at nighttime.  His most recent CT scan also showed an obstruction of his frontal recess and ostiomeatal meatus bilaterally.  His sphenoid ostia are also obstructed by edematous mucosa.   Exam: The nasal cavities were decongested and anesthetised with a combination of oxymetazoline and 4% lidocaine solution.  The flexible scope was inserted into the right nasal cavity.  Endoscopy of the inferior and middle meatus was performed.  Edematous and polypoid mucosa was noted.  NSD also noted. Olfactory cleft was clear.  Nasopharynx was clear.  Turbinates were hypertrophied but without mass.  Incomplete response to decongestion.  The procedure was repeated on the contralateral side with similar findings.  The patient tolerated the procedure well.  Instructions were given to avoid eating or drinking for 2 hours.    Assessment: 1.  Bilateral chronic rhinosinusitis.  His sinus drainage pathways are obstructed bilaterally.   2.  Chronic nasal obstruction, secondary to nasal septal deviation, bilateral inferior turbinate hypertrophy, and bilateral large concha bullosa.  Polypoid tissue is also noted within the nasal cavities bilaterally.   Plan: 1.  The nasal endoscopy findings and the CT images are reviewed with the patient and his  wife.   2.  The patient should continue with Flonase nasal spray and nasal saline irrigation.  3.  In light of his persistent symptoms, he may benefit from undergoing bilateral endoscopic sinus surgery, septoplasty, turbinate reduction and bilateral concha bullosa resection.  The risks, benefits, and details of the procedures are extensively discussed.   4.  The patient would like to proceed with the procedures.

## 2018-08-03 ENCOUNTER — Encounter (HOSPITAL_BASED_OUTPATIENT_CLINIC_OR_DEPARTMENT_OTHER): Payer: Self-pay | Admitting: Otolaryngology

## 2018-08-03 NOTE — Anesthesia Postprocedure Evaluation (Signed)
Anesthesia Post Note  Patient: Omar Kennedy  Procedure(s) Performed: NASAL SEPTOPLASTY WITH BILATERAL TURBINATE REDUCTION (Bilateral Nose) ENDOSCOPIC BILATERAL CONCHA BULLOSA RESECTION (Bilateral Nose) ENDOSCOPIC BILATERAL MAXILLARY ANTROSTOMY WITH TISSUE REMOVAL (Bilateral Nose) ENDOSCOPIC BILATERAL FRONTAL RECESS EXPLORATION (Bilateral Nose) TOTAL ETHMOIDECTOMY AND SPHENOIDECTOMY (Bilateral Nose) SINUS ENDO WITH FUSION (Bilateral Nose)     Patient location during evaluation: PACU Anesthesia Type: General Level of consciousness: sedated and patient cooperative Pain management: pain level controlled Vital Signs Assessment: post-procedure vital signs reviewed and stable Respiratory status: spontaneous breathing Cardiovascular status: stable Anesthetic complications: no    Last Vitals:  Vitals:   08/02/18 1226 08/02/18 1230  BP: (!) 165/72 (!) 159/70  Pulse: 70   Resp: 18   Temp: 36.7 C   SpO2: 97%     Last Pain:  Vitals:   08/02/18 1226  TempSrc:   PainSc: 3                  Lewie Loron

## 2018-08-04 NOTE — Addendum Note (Signed)
Addendum  created 08/04/18 2395 by Arlean Thies, Jewel Baize, CRNA   Charge Capture section accepted

## 2018-11-07 ENCOUNTER — Other Ambulatory Visit: Payer: Self-pay

## 2018-11-07 ENCOUNTER — Ambulatory Visit
Admission: RE | Admit: 2018-11-07 | Discharge: 2018-11-07 | Disposition: A | Discharge status: Home / Self Care | Payer: Medicare Other | Source: Ambulatory Visit | Attending: Family Medicine | Admitting: Family Medicine

## 2018-11-07 ENCOUNTER — Other Ambulatory Visit: Payer: Self-pay | Admitting: Family Medicine

## 2018-11-07 ENCOUNTER — Other Ambulatory Visit: Payer: Self-pay | Admitting: Physician Assistant

## 2018-11-07 DIAGNOSIS — M25562 Pain in left knee: Secondary | ICD-10-CM

## 2019-05-11 ENCOUNTER — Telehealth (INDEPENDENT_AMBULATORY_CARE_PROVIDER_SITE_OTHER): Payer: Medicare Other | Admitting: Neurology

## 2019-05-11 ENCOUNTER — Other Ambulatory Visit: Payer: Self-pay

## 2019-05-11 ENCOUNTER — Encounter: Payer: Self-pay | Admitting: Neurology

## 2019-05-11 VITALS — Ht 71.0 in | Wt 173.0 lb

## 2019-05-11 DIAGNOSIS — G3184 Mild cognitive impairment, so stated: Secondary | ICD-10-CM

## 2019-05-11 MED ORDER — RIVASTIGMINE TARTRATE 3 MG PO CAPS: 3.0000 mg | ORAL_CAPSULE | Freq: Two times a day (BID) | ORAL | 3 refills | Status: DC

## 2019-05-11 NOTE — Progress Notes (Signed)
Virtual Visit via Video Note The purpose of this virtual visit is to provide medical care while limiting exposure to the novel coronavirus.    Consent was obtained for video visit:  Yes.   Answered questions that patient had about telehealth interaction:  Yes.   I discussed the limitations, risks, security and privacy concerns of performing an evaluation and management service by telemedicine. I also discussed with the patient that there may be a patient responsible charge related to this service. The patient expressed understanding and agreed to proceed.  Pt location: Home Physician Location: office Name of referring provider:  Marisue IvanLinthavong, Kanhka, MD I connected with Omar Kennedy at patients initiation/request on 05/11/2019 at 11:00 AM EDT by video enabled telemedicine application and verified that I am speaking with the correct person using two identifiers. Pt MRN:  [id number] Pt DOB:  1946/03/17 Video Participants:  Omar GuppyJohn S Kennedy;  Omar BariSusan Kennedy (spouse)   History of Present Illness:  This is a 73 year old ambidextrous right-hand dominant man with a history of hypertension, hyperlipidemia, CAD, OSA, presenting for evaluation of memory loss. He states his memory is "bad." He started noticing changes a year ago, his wife started noticing changes 1-2 years ago. He reports heart surgery 2 years ago and feels his memory was fine after, his wife started noticing changes even before the heart surgery, but after his surgery, symptoms progressively worsened. He could not think of names of things. He denies getting lost driving, his wife states he is a little forgetful where to turn. His wife started managing his pillbox and bills after the surgery. 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 He did their taxes last year but took a long time, when he stopped he would not remember what he last did. 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 They search daily for things, he states he has always been absent-minded. [facility], MOCA 27/30, diagnosed with Mild Cognitive Impairment. He had diarrhea on Donepezil and has been taking Rivastigmine 1.5mg  BID over the past year with no side effects. He has been [facility] at [facility] with mild OSA, currently on positional therapy getting 6-7 hours of sleep which is "10x better." His wife reports that when he gets good sleep, memory is better.  His wife states he is a lot more irritable, worse after the surgery. No paranoia or hallucinations.His paternal grandfather had memory issues. No history of concussions. He drinks 1-2 beers daily.   He denies any headaches, dizziness, diplopia, bowel/bladder dysfunction, anosmia, or tremors.He has back pain and numbness and tingling in both hands, L>R. His fingers and toes get cold easily and change color. No significant falls.      PAST MEDICAL HISTORY: Past Medical History:  Diagnosis Date   Arthritis    CAD (coronary artery disease)    CABG x4 2018   Deviated septum    Hypercholesteremia    Hypertension    Memory deficits    Nasal turbinate hypertrophy    Sleep apnea 06/2018   has OSA but not on CPAP until after sinus surgery    PAST SURGICAL HISTORY: Past Surgical History:  Procedure Laterality Date   CARDIAC SURGERY     CORONARY ARTERY BYPASS GRAFT  2018   ENDOSCOPIC CONCHA BULLOSA RESECTION Bilateral 08/02/2018   Procedure: ENDOSCOPIC BILATERAL CONCHA BULLOSA RESECTION;  Surgeon:  Leta Baptist, MD;  Location: [place];  Service: ENT;  Laterality: Bilateral;   ETHMOIDECTOMY Bilateral 08/02/2018   Procedure: TOTAL ETHMOIDECTOMY AND SPHENOIDECTOMY;  Surgeon: Leta Baptist, MD;  Location: [place];  Service: ENT;  Laterality: Bilateral;   EXCISION MORTON'S NEUROMA Left    FRONTAL SINUS EXPLORATION Bilateral 08/02/2018   Procedure: ENDOSCOPIC  BILATERAL FRONTAL RECESS EXPLORATION;  Surgeon: Leta Baptist, MD;  Location: [place];  Service: ENT;  Laterality: Bilateral;   HERNIA REPAIR     MAXILLARY ANTROSTOMY Bilateral 08/02/2018   Procedure: ENDOSCOPIC BILATERAL MAXILLARY ANTROSTOMY WITH TISSUE REMOVAL;  Surgeon: Leta Baptist, MD;  Location: [place];  Service: ENT;  Laterality: Bilateral;   NASAL SEPTOPLASTY W/ TURBINOPLASTY Bilateral 08/02/2018   Procedure: NASAL SEPTOPLASTY WITH BILATERAL TURBINATE REDUCTION;  Surgeon: Leta Baptist, MD;  Location: [place];  Service: ENT;  Laterality: Bilateral;   SINUS ENDO WITH FUSION Bilateral 08/02/2018   Procedure: SINUS ENDO WITH FUSION;  Surgeon: Leta Baptist, MD;  Location: [place];  Service: ENT;  Laterality: Bilateral;   umbilical surgery      MEDICATIONS: Current Outpatient Medications on File Prior to Visit  Medication Sig Dispense Refill   acetaminophen (TYLENOL) 325 MG tablet Take by mouth.     aspirin 81 MG chewable tablet Chew 81 mg by mouth daily.     calcium-vitamin D (OSCAL WITH D) 500-200 MG-UNIT tablet Take 1 tablet by mouth.     carvedilol (COREG) 12.5 MG tablet Take 0.5 mg by mouth daily. Verified takes 0.5mg  daily     ferrous sulfate 325 (65 FE) MG EC tablet Take 325 mg by mouth daily with breakfast.      fluticasone (FLONASE) 50 MCG/ACT nasal spray Place into the nose.     lisinopril (PRINIVIL,ZESTRIL) 5 MG tablet Take 20 mg by mouth daily.   3   Melatonin 10 MG TABS Take by mouth. Takes 3 mg tab at night     Multiple Vitamin (MULTIVITAMIN WITH MINERALS) TABS tablet Take 1 tablet by mouth daily.     rivastigmine (EXELON) 1.5 MG capsule Take 1.5 mg by mouth 2 (two) times daily.     rosuvastatin (CRESTOR) 40 MG tablet TAKE ONE TABLET BY MOUTH AT NIGHT  2   Tamsulosin HCl (FLOMAX PO) Take 0.4 mg by mouth daily.      triamcinolone cream (KENALOG) 0.5 % Apply topically.     vitamin B-12 (CYANOCOBALAMIN)  1000 MCG tablet Take 1,000 mcg by mouth daily.     sildenafil (VIAGRA) 100 MG tablet Take 100 mg by mouth as needed.     No current facility-administered medications on file prior to visit.     ALLERGIES: Allergies  Allergen Reactions   Tetracycline Other (See Comments)    VERTIGO   Amoxicillin-Pot Clavulanate Nausea Only   Wasp Venom Swelling    FAMILY HISTORY: Family History  Problem Relation Age of Onset   Stroke Mother      Observations/Objective:   Vitals:   05/11/19 0953  Weight: 173 lb (78.5 kg)  Height: 5\' 11"  (1.803 m)   GEN:  The patient appears stated age and is in NAD.  Neurological examination: Patient is awake, alert, oriented x 3. No aphasia or dysarthria. Intact fluency and comprehension. Recent memory intact. Difficulty with repetition. Due to technical difficulties initially, memory testing was performed over the telephone, [place] blind was 8/22. Cranial nerves: Extraocular movements intact with no nystagmus. No facial asymmetry.  Motor: moves all extremities symmetrically, at least anti-gravity x 4. No incoordination on finger to nose testing. Gait: narrow-based and steady, able to tandem walk adequately. Negative Romberg test.  Montreal Cognitive Assessment Blind 05/11/2019  Attention: Read list of digits (0/2) 0  Attention: Read list of letters (0/1) 1  Attention: Serial 7 subtraction starting at 100 (0/3) 1  Language: Repeat phrase (0/2) 0  Language : Fluency (0/1) 1  Abstraction (0/2) 0  Delayed Recall (0/5) 0  Orientation (0/6) 5  Total 8     Assessment and Plan:   This is a 73 year old right-handed man with a history of  hypertension, hyperlipidemia, CAD, OSA, presenting for evaluation of memory loss. He was [facility] at [facility] in 2019 with MOCA score of 27/30, diagnosed with Mild Cognitive Impairment. Due to technical difficulties, memory testing today was done over phone, MOCA blind 8/30. His wife reports worsening memory, they both report more  difficulties with complex tasks. Symptoms concerning for mild dementia, possibly vascular. MRI brain without contrast will be ordered to assess for underlying structural abnormality and assess vascular load. Neurocognitive testing will be helpful to further evaluate cognitive complaints. We discussed increasing Rivastigmine dose to 3mg  BID. Continue to monitor driving. Follow-up in 6 months, they know to call for any changes.    Follow Up Instructions:   -I discussed the assessment and treatment plan with the patient. The patient was provided an opportunity to ask questions and all were answered. The patient agreed with the plan and demonstrated an understanding of the instructions.   The patient was advised to call back or seek an in-person evaluation if the symptoms worsen or if the condition fails to improve as anticipated.    , MD

## 2019-06-02 ENCOUNTER — Ambulatory Visit
Admission: RE | Admit: 2019-06-02 | Discharge: 2019-06-02 | Disposition: A | Discharge status: Home / Self Care | Payer: Medicare Other | Source: Ambulatory Visit | Attending: Neurology | Admitting: Neurology

## 2019-06-02 ENCOUNTER — Other Ambulatory Visit: Payer: Self-pay

## 2019-06-02 DIAGNOSIS — G3184 Mild cognitive impairment, so stated: Secondary | ICD-10-CM

## 2019-06-05 ENCOUNTER — Telehealth: Payer: Self-pay

## 2019-06-05 NOTE — Telephone Encounter (Signed)
Pt informed of MRI results. Appt with Melvyn Novas 06/21/19

## 2019-06-05 NOTE — Telephone Encounter (Signed)
-----   Message from Cameron Sprang, MD sent at 06/05/2019  9:56 AM EST ----- Pls let patient/wife know that the MRI brain did not show any evidence of tumor, stroke, or bleed. It showed age-related changes. Proceed with Neurocognitive testing in Dec, thanks!

## 2019-06-21 ENCOUNTER — Other Ambulatory Visit: Payer: Self-pay

## 2019-06-21 ENCOUNTER — Encounter: Payer: Self-pay | Admitting: Psychology

## 2019-06-21 ENCOUNTER — Ambulatory Visit (INDEPENDENT_AMBULATORY_CARE_PROVIDER_SITE_OTHER): Payer: Medicare Other | Admitting: Psychology

## 2019-06-21 ENCOUNTER — Ambulatory Visit: Payer: Medicare Other

## 2019-06-21 DIAGNOSIS — F0150 Vascular dementia without behavioral disturbance: Secondary | ICD-10-CM | POA: Diagnosis not present

## 2019-06-21 DIAGNOSIS — F01A0 Vascular dementia, mild, without behavioral disturbance, psychotic disturbance, mood disturbance, and anxiety: Secondary | ICD-10-CM

## 2019-06-21 DIAGNOSIS — G3184 Mild cognitive impairment, so stated: Secondary | ICD-10-CM

## 2019-06-21 NOTE — Progress Notes (Signed)
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 Score) Percentile     Total Trials 1-3 8/36 (28) 2 Exceptionally Low    Delayed Recall 4/12 (33) 5 Well Below Average    Recognition Discrimination Index 4 6-10 Well Below Average      Recognition Hits 5/6 >16 Within Normal Limits      False Positive Errors 1 11-16 Below Average        Attention/Executive Function:          Trail Making Test (TMT): Raw Score (T Score) Percentile     Part A 43 secs.,  0 errors (41) 18 Below Average    Part B 175 secs.,  0 errors (31) 3 Well Below Average        Scaled Score Percentile   WAIS-IV Coding: 3 1 Exceptionally Low       NAB Attention Module, Form 1: T Score Percentile     Digits Forward 31 3 Well Below Average    Digits Backwards 33 5 Well Below Average       D-KEFS Color-Word Interference Test: Raw Score (Scaled Score) Percentile     Color Naming 42 secs. (7) 16 Below Average    Word Reading 27 secs. (9) 37 Average    Inhibition 129 secs. (1) <1 Exceptionally Low      Total Errors 1 error (12) 75 Above Average    Inhibition/Switching 180 secs. (1) <1 Exceptionally Low      Total Errors 6 errors (7) 16 Below Average       D-KEFS 20  Questions Test: Scaled Score Percentile     Total Weighted Achievement Score 9 37 Average    Initial Abstraction Score 9 37 Average       [place] Card Sorting Test [facility]): Raw Score Percentile     Categories (trials) 1 (64) 6-10 Well Below Average    Total Errors 29 3 Well Below Average    Perseverative Errors 17 <1 Exceptionally Low    Non-Perseverative Errors 12 7 Well Below Average    Failure to Maintain Set 0 --- ---       Language:          Verbal Fluency Test: Raw Score (Z-Score) Percentile     Phonemic Fluency (FAS) 20 (-1.82) 4 Well Below Average    Animal Fluency 12 (-1.48) 7 Well Below Average  *Based on [employer] Older Normative Studies (MOANS)          NAB Language Module, Form 2: T Score Percentile     Auditory Comprehension 53 62 Average    Naming 30/31 (56) 73 Average       Visuospatial/Visuoconstruction:      Raw Score Percentile  Clock Drawing: 8/10 --- Within Normal Limits       NAB Spatial Module, Form 2: T Score Percentile     Figure Drawing Copy 74 99 Exceptionally High        Scaled Score Percentile   WAIS-IV Matrix Reasoning: 7 16 Below Average       Mood and Personality:      Raw Score Percentile   Geriatric Depression Scale: 6 --- Within Normal Limits  Geriatric Anxiety Scale: 6 --- Minimal    Somatic 3 --- Minimal    Cognitive 1 --- Minimal    Affective 2 --- Minimal       Additional Questionnaires:      Raw Score Percentile   PROMIS Sleep Disturbance Questionnaire: 30 --- Moderate    Informed Consent and Coding/Compliance:   Omar Kennedy was provided with a verbal description of the nature and purpose of the present neuropsychological evaluation. Also reviewed were the foreseeable risks and/or discomforts and benefits of the procedure, limits of confidentiality, and mandatory reporting requirements of this provider. The patient was given the opportunity to ask questions and receive answers about the evaluation. Oral consent to participate was  provided by the patient.   This evaluation was conducted by Omar Kennedy, Ph.D., licensed clinical neuropsychologist. Omar Kennedy completed a 60-minute clinical interview, billed as one unit [phone], and 145 minutes of cognitive testing, billed as one unit [phone] and four additional units 96139. Psychometrist Shan Levans, B.S., assisted Dr. Milbert Kennedy with test administration and scoring procedures. As a separate and discrete service, Dr. Milbert Kennedy spent a total of 180 minutes in interpretation and report writing, billed as one unit 96132 and two units 96133.

## 2019-06-21 NOTE — Progress Notes (Signed)
   Neuropsychology Note   JAXSUN CIAMPI completed 130 minutes of neuropsychological testing with technician, Cruzita Lederer, B.S., under the supervision of Dr. Christia Reading, Ph.D., licensed neuropsychologist. The patient did not appear overtly distressed by the testing session, per behavioral observation or via self-report to the technician. Rest breaks were offered.    In considering the patient's current level of functioning, level of presumed impairment, nature of symptoms, emotional and behavioral responses during the interview, level of literacy, and observed level of motivation/effort, a battery of tests was selected and communicated to the psychometrician.   Communication between the psychologist and technician was ongoing throughout the testing session and changes were made as deemed necessary based on patient performance on testing, technician observations and additional pertinent factors such as those listed above.   Geannie Risen will return within approximately two weeks for an interactive feedback session with Dr. Melvyn Novas at which time his test performances, clinical impressions, and treatment recommendations will be reviewed in detail. The patient understands he can contact our office should he require our assistance before this time.   Full report to follow.  130 minutes were spent face-to-face with patient administering standardized tests and 15 minutes were spent scoring (technician). [CPT [id number], [id number]]

## 2019-06-22 ENCOUNTER — Encounter: Payer: Self-pay | Admitting: Psychology

## 2019-06-22 DIAGNOSIS — F01A0 Vascular dementia, mild, without behavioral disturbance, psychotic disturbance, mood disturbance, and anxiety: Secondary | ICD-10-CM

## 2019-06-22 DIAGNOSIS — F0150 Vascular dementia without behavioral disturbance: Secondary | ICD-10-CM

## 2019-06-22 DIAGNOSIS — I999 Unspecified disorder of circulatory system: Secondary | ICD-10-CM

## 2019-06-22 HISTORY — DX: Vascular dementia, mild, without behavioral disturbance, psychotic disturbance, mood disturbance, and anxiety: F01.A0

## 2019-06-22 HISTORY — DX: Unspecified disorder of circulatory system: I99.9

## 2019-06-22 HISTORY — DX: Vascular dementia without behavioral disturbance: F01.50

## 2019-07-19 ENCOUNTER — Ambulatory Visit (INDEPENDENT_AMBULATORY_CARE_PROVIDER_SITE_OTHER): Payer: Medicare Other | Admitting: Psychology

## 2019-07-19 ENCOUNTER — Other Ambulatory Visit: Payer: Self-pay

## 2019-07-19 DIAGNOSIS — F01A0 Vascular dementia, mild, without behavioral disturbance, psychotic disturbance, mood disturbance, and anxiety: Secondary | ICD-10-CM

## 2019-07-19 DIAGNOSIS — F0150 Vascular dementia without behavioral disturbance: Secondary | ICD-10-CM

## 2019-07-19 NOTE — Progress Notes (Signed)
   Neuropsychology Feedback Session Tillie Rung. [place] Department of Neurology  Reason for Referral:   DRACEN REIGLE a 73 y.o. Caucasian male referred by Ellouise Newer, M.D.,to characterize hiscurrent cognitive functioning and assist with diagnostic clarity and treatment planning in the context of subjective cognitive dysfunction, prior diagnosis of mild cognitive impairment, several cardiovascular comorbidities, and obstructive sleep apnea.  Feedback:   Dr. Nicole Kindred completed a comprehensive neuropsychological evaluation on 06/21/2019. Please refer to that encounter for the full report and recommendations. Briefly, results suggested moderate frontal-subcortical dysfunction, evidenced by primary difficulties surrounding processing speed, attention/concentration, executive functioning, verbal fluency, and encoding/retrieval aspects of memory. Overall, this pattern of performance is most consistent with Dr. Les Pou history of various cardiovascular ailments (including prior heart surgery) and neuroimaging suggesting small vessel ischemic changes. Overall, given evidence for cognitive dysfunction, coupled with Dr. 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 Additionally, Dr. Nicole Kindred reported moderate levels of sleep dysfunction on a related questionnaire, consistent with his report of ongoing sleep dysfunction and poorly managed obstructive sleep apnea in his everyday life.   Dr. Nicole Kindred was accompanied by his wife during the current telephone call. Content of the current session focused on the results of his neuropsychological evaluation. Dr. Nicole Kindred and his wife were given the opportunity to ask questions and their questions were answered. They were encouraged to reach out should additional questions arise.     A  total of 20 minutes were spent with Dr. Nicole Kindred during the current feedback session.

## 2019-07-25 ENCOUNTER — Other Ambulatory Visit: Payer: Self-pay

## 2019-07-25 ENCOUNTER — Telehealth: Payer: Self-pay | Admitting: Neurology

## 2019-07-25 DIAGNOSIS — G4700 Insomnia, unspecified: Secondary | ICD-10-CM

## 2019-07-25 DIAGNOSIS — G4730 Sleep apnea, unspecified: Secondary | ICD-10-CM

## 2019-07-25 NOTE — Telephone Encounter (Signed)
Spoke with pt wife she stated that they have new insurance and would like to start over with the sleep study, she said Omar Kennedy has tried to titrate his CPAP and did not tolerate it she said that because his other MD retired they ended up with the sleep specialist and if they have to do it in lab can it be a private lab because he doesn't sleep well away from home. She said that because now you are his MD could they start fresh with new sleep study orders

## 2019-07-25 NOTE — Telephone Encounter (Signed)
Pls let her know that I reviewed the records from her sleep specialist at [facility], it looks like the repeat sleep study will need to be in-lab because they may be adjusting his CPAP to know how he can tolerate it. Thanks

## 2019-07-25 NOTE — Telephone Encounter (Signed)
Spoke with pt wife, sleep study referral sent to [facility]

## 2019-07-25 NOTE — Telephone Encounter (Signed)
For [facility] sleep studies, patients need to see their sleep doctor first before a sleep study can be done. They have sleep specialists there which can help with his sleep. I usually start with melatonin first to help with sleep in older patients. If she wants to do sleep study through [facility], pls send referral, or if she wants [facility], pls send to Pulmonary Medicine. Thanks!

## 2019-07-25 NOTE — Telephone Encounter (Signed)
Patient's wife called and requested a referral for a sleep study for the patient, preferably at home.

## 2019-07-25 NOTE — Telephone Encounter (Signed)
I will be happy to start fresh, however since he already has been found to have sleep apnea in the past, doing a home sleep test will likely show the same thing, and doing it in the lab can help with further treatment of known sleep apnea by adjusting CPAP settings in the lab. I don't think there are any private labs, all our sleep studies are done through Pulmonary lab at [facility] I believe.

## 2019-07-25 NOTE — Telephone Encounter (Signed)
Pt wife asking if we can order the sleep study at [facility]? Also asking if we have ever done any sleep studies with [facility]? Pt wife also asking about maybe a sleeping pill? She said he wakes up with any little noise.

## 2019-08-03 ENCOUNTER — Ambulatory Visit: Payer: Medicare Other | Admitting: Neurology

## 2019-08-03 ENCOUNTER — Other Ambulatory Visit: Payer: Self-pay

## 2019-08-03 ENCOUNTER — Encounter: Payer: Self-pay | Admitting: Neurology

## 2019-08-03 VITALS — BP 137/72 | HR 61 | Temp 97.0°F | Ht 71.0 in | Wt 174.0 lb

## 2019-08-03 DIAGNOSIS — R351 Nocturia: Secondary | ICD-10-CM

## 2019-08-03 DIAGNOSIS — G4733 Obstructive sleep apnea (adult) (pediatric): Secondary | ICD-10-CM | POA: Diagnosis not present

## 2019-08-03 DIAGNOSIS — I25118 Atherosclerotic heart disease of native coronary artery with other forms of angina pectoris: Secondary | ICD-10-CM

## 2019-08-03 DIAGNOSIS — Z951 Presence of aortocoronary bypass graft: Secondary | ICD-10-CM | POA: Diagnosis not present

## 2019-08-03 DIAGNOSIS — G3184 Mild cognitive impairment, so stated: Secondary | ICD-10-CM | POA: Insufficient documentation

## 2019-08-03 DIAGNOSIS — R6889 Other general symptoms and signs: Secondary | ICD-10-CM

## 2019-08-03 MED ORDER — ALPRAZOLAM 0.25 MG PO TABS: 0.2500 mg | ORAL_TABLET | Freq: Every evening | ORAL | 0 refills | Status: DC | PRN

## 2019-08-03 NOTE — Addendum Note (Signed)
Addended by: Melvyn Novas on: 08/03/2019 11:02 AM   Modules accepted: Orders

## 2019-08-03 NOTE — Progress Notes (Signed)
SLEEP MEDICINE CLINIC    Provider:  Larey Seat, MD  Primary Care Physician:  Dion Body, MD [place] [address]     Referring Provider: Dr. Ellouise Newer, MD.         Chief Complaint according to patient   Patient presents with:    . New Patient (Initial Visit)     pt has had a sleep study in 2019 and failed with compliance- and is here to restart the process t to treat his apnea.      HISTORY OF PRESENT ILLNESS:  Omar Kennedy is a 74 year old Caucasian male patient seen here in the presence of his wife on 08/03/2019  for a sleep evaluation.   He was referred by Dr Delice Lesch after a  Telephone/ video conversation visit.   Chief concern according to patient : " I am concerned about sleep affecting my memory" .    I have the pleasure of seeing Omar Kennedy today, a right-handed White or Caucasian male with a possible sleep disorder. 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 His daughter is an interventional radiologist-he has been followed by the memory clinic at [facility].  He has also noted that his memory seems to have declined following an open heart surgery in December 2018.  He was followed by Dr. Amada Kingfisher until his retirement. He has seen a cuselor for behavior therapy.   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  The patient had the first sleep study in the year 2019  with a result of OSA and REM BD.   Family medical /sleep history: No other family member on CPAP with OSA, insomnia, sleep walkers.    Social history:  Patient is working as a Nurse, learning disability, but retired Brewing technologist, used to be on Development worker, international aid at [employer] and lives in a household with 2 persons. Family status is married , with 3 adult children.  Pets are present. Dogs and 1 cat.  Tobacco use- 40 years ago.  ETOH use :none for 18 month , before 1-2 glasses a week.  Caffeine intake in form of Coffee( no) Soda( no) Tea ( no) or energy drinks. Regular exercise in form of : outdoor/  farming. .      Sleep habits are as follows: The patient's dinner time is between 6 PM. The patient goes to bed at 11 PM and continues to sleep for 5-7 hours, wakes for 3-4 bathroom breaks,- fragmented a lot.  He often gets up and does something , paperwork at 3 AM, and may be back to sleep for 1 hour. His wife reports he yells, and kicks and is very loud and active - REM BD.     The preferred sleep position is supine, with the support of 1 pillow ( My Pillow). Dreams are reportedly rare/ frequent.   6-7  AM is the usual rise time. The patient wakes up spontaneously , not with an alarm.  He reports not feeling refreshed or restored in AM, with symptoms such as dry mouth, sometimes morning headaches, and residual fatigue. Naps are taken infrequently, unintentionally- lasting from 20-35  minutes and are refreshing.  Right after lunch.    Review of Systems: Out of a complete 14 system review, the patient complains of only the following symptoms, and all other reviewed systems are negative.:  Fatigue, sleepiness , snoring, fragmented sleep, Insomnia    How likely are you to doze in the following situations: 0 = not likely, 1 = slight chance, 2 = moderate chance, 3 = high chance   Sitting and Reading? Watching Television? Sitting inactive in a public place (theater or meeting)? As a passenger in a car for an hour without a break? Lying down in the afternoon when circumstances permit? Sitting and talking to  someone? Sitting quietly after lunch without alcohol? In a car, while stopped for a few minutes in traffic?   Total = 12/ 24 points     Social History   Socioeconomic History  . Marital status: Married    Spouse name: Not on file  . Number of children: Not on file  . Years of education: 53  . Highest education level: Doctorate  Occupational History  . Not on file  Tobacco Use  . Smoking status: Former Smoker    Quit date: 1980    Years since quitting: 41.0  . Smokeless tobacco: Never Used  Substance and Sexual Activity  . Alcohol use: Yes    Alcohol/week: 7.0 standard drinks    Types: 7 Cans of beer per week    Comment: social  . Drug use: Never  . Sexual activity: Not on file  Other Topics Concern  . 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 Difficulty of Paying Living Expenses: Not on file  Food Insecurity:   . Worried About Programme researcher, broadcasting/film/video in the Last Year: Not on file  . Ran Out of Food in the Last Year: Not on file  Transportation Needs:   . Lack of Transportation (Medical): Not on file  . Lack of Transportation (Non-Medical): Not on file  Physical Activity:   . Days of Exercise per Week: Not on file  . Minutes of Exercise per Session: Not on file  Stress:   . Feeling of Stress : Not on file  Social Connections:   . Frequency of Communication with Friends and Family: Not on file  . Frequency of Social Gatherings with Friends and Family: Not on file  . Attends Religious Services: Not on file  . Active Member of Clubs or Organizations: Not on file  . Attends Banker Meetings: Not on file  . Marital Status: Not on file    Family History  Problem Relation Age of Onset  . Stroke Mother     Past Medical History:   Diagnosis Date  . Arthritis   . Coronary artery disease involving native coronary artery of native heart 05/27/2017  . Deviated septum   . HTN (hypertension) 08/12/2017  . Hypercholesteremia   . Hypercholesterolemia 12/10/2015  . Hypertension   . 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/2015  . Nasal turbinate hypertrophy   . Obstructive sleep apnea 06/2018   Not on CPAP until after sinus surgery; positional therapy not helpful  REM  BD 2019 - Dr Norva Pavlov  Sleep hygiene counseling   . 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    Past Surgical History:  Procedure Laterality Date  . CARDIAC SURGERY    . CORONARY ARTERY BYPASS GRAFT  2018  . ENDOSCOPIC CONCHA BULLOSA RESECTION Bilateral 08/02/2018   Procedure: ENDOSCOPIC BILATERAL CONCHA BULLOSA RESECTION;  Surgeon: Newman Pies, MD;  Location: [facility];  Service: ENT;  Laterality: Bilateral;  . ETHMOIDECTOMY Bilateral 08/02/2018   Procedure: TOTAL ETHMOIDECTOMY AND SPHENOIDECTOMY;  Surgeon: Newman Pies, MD;  Location: [facility];  Service: ENT;  Laterality: Bilateral;  . EXCISION MORTON'S NEUROMA Left   . FRONTAL SINUS EXPLORATION Bilateral 08/02/2018   Procedure: ENDOSCOPIC BILATERAL FRONTAL RECESS EXPLORATION;  Surgeon: Newman Pies, MD;  Location: [facility];  Service: ENT;  Laterality: Bilateral;  . HERNIA REPAIR    . MAXILLARY ANTROSTOMY Bilateral 08/02/2018   Procedure: ENDOSCOPIC BILATERAL MAXILLARY ANTROSTOMY WITH TISSUE REMOVAL;  Surgeon: Newman Pies, MD;  Location: [facility];  Service: ENT;  Laterality: Bilateral;  . NASAL SEPTOPLASTY W/ TURBINOPLASTY Bilateral 08/02/2018   Procedure: NASAL SEPTOPLASTY WITH BILATERAL TURBINATE REDUCTION;  Surgeon: Newman Pies, MD;  Location: [facility];  Service: ENT;  Laterality: Bilateral;  . SINUS ENDO WITH FUSION Bilateral 08/02/2018   Procedure: SINUS ENDO WITH FUSION;  Surgeon: Newman Pies, MD;   Location: [facility];  Service: ENT;  Laterality: Bilateral;  . umbilical surgery       Current Outpatient Medications on File Prior to Visit  Medication Sig Dispense Refill  . acetaminophen (TYLENOL) 325 MG tablet Take by mouth.    Marland Kitchen ascorbic acid (VITAMIN C) 500 MG tablet Take by mouth.    Marland Kitchen aspirin 81 MG chewable tablet Chew 81 mg by mouth daily.    . calcium-vitamin D (OSCAL WITH D) 500-200 MG-UNIT tablet Take 1 tablet by mouth.    . carvedilol (COREG) 12.5 MG tablet Take 0.5 mg by mouth daily. Verified takes 0.5mg  daily    . ferrous sulfate 325 (65 FE) MG EC tablet Take 325 mg by mouth daily with breakfast.     . fluticasone (FLONASE) 50 MCG/ACT nasal spray Place into the nose.    Marland Kitchen lisinopril (ZESTRIL) 20 MG tablet Take 20 mg by mouth daily.    . Melatonin 10 MG TABS Take by mouth. Takes 3 mg tab at night    . Multiple Vitamin (MULTIVITAMIN WITH MINERALS) TABS tablet Take 1 tablet by mouth daily.    . rivastigmine (EXELON) 3 MG capsule Take 1 capsule (3 mg total) by mouth 2 (two) times daily. 180 capsule 3  . rosuvastatin (CRESTOR) 40 MG tablet TAKE ONE TABLET BY MOUTH AT NIGHT  2  . sildenafil (VIAGRA) 100 MG tablet Take 100 mg by mouth as needed.    . Tamsulosin HCl (FLOMAX PO) Take 0.4 mg by mouth daily.     Marland Kitchen triamcinolone cream (KENALOG) 0.5 % Apply topically.    . vitamin B-12 (CYANOCOBALAMIN) 1000 MCG tablet Take 1,000 mcg by mouth daily.     No current facility-administered medications on file prior to visit.    Allergies  Allergen Reactions  . Tetracycline Other (See Comments)    VERTIGO  . Amoxicillin-Pot Clavulanate Nausea Only  . Wasp Venom Swelling    Physical exam:  Today's Vitals   08/03/19 0956  BP: 137/72  Pulse: 61  Temp: (!) 97 F (36.1 C)  Weight: 174 lb (78.9 kg)  Height:  5\' 11"  (1.803 m)   Body mass index is 24.27 kg/m.   Wt Readings from Last 3 Encounters:  08/03/19 174 lb (78.9 kg)  05/11/19 173 lb (78.5 kg)  08/02/18 175  lb 4.3 oz (79.5 kg)     Ht Readings from Last 3 Encounters:  08/03/19 5\' 11"  (1.803 m)  05/11/19 5\' 11"  (1.803 m)  08/02/18 5\' 11"  (1.803 m)      General: The patient is awake, alert and appears not in acute distress. The patient is well groomed. Head: Normocephalic, atraumatic. Neck is supple. Mallampati 1,  neck circumference:15 inches . Nasal airflow is patent.  18 sec breath-holding. status post sinus surgery   Retrognathia is mildy seen.  Dental status: intact  Cardiovascular:  Regular rate and cardiac rhythm by pulse,  without distended neck veins. Respiratory: Lungs are clear to auscultation.  Skin:  Without evidence of ankle edema, or rash. Trunk: The patient's posture is erect.   Neurologic exam : The patient is awake and alert, oriented to place and time.   Memory subjective described as intact.  Attention span & concentration ability appears normal.  Speech is fluent,  without  dysarthria, dysphonia or aphasia.  Mood and affect are appropriate.   Cranial nerves: no loss of smell or taste reported  Pupils are equal and briskly reactive to light. Funduscopic exam deferred.   Extraocular movements in vertical and horizontal planes were intact and without nystagmus. No Diplopia. Visual fields by finger perimetry are intact. Hearing was intact to soft voice and finger rubbing.    Facial sensation intact to fine touch.  Facial motor strength is symmetric and tongue and uvula move midline.  Neck ROM : rotation, tilt and flexion extension were normal for age and shoulder shrug was symmetrical.    Motor exam:  Symmetric bulk, tone and ROM.   Elevated tone with cog wheeling, symmetric grip strength.   Sensory:  Fine touch, pinprick and vibration were tested  and  normal.  Proprioception tested in the upper extremities was normal.   Coordination: Rapid alternating movements in the fingers/hands were of normal speed.  The Finger-to-nose maneuver was intact without evidence of  ataxia, dysmetria or tremor.   Gait and station: Patient could rise unassisted from a seated position, walked without assistive device.  Stance is of normal width/ base and the patient turned with 4 steps.  Toe and heel walk were deferred.  Deep tendon reflexes: in the  upper and lower extremities are symmetric and intact. 1 plus.   Babinski response was deferred .     After spending a total time of  55 minutes face to face and additional time for physical and neurologic examination, review of laboratory studies,  personal review of imaging studies, reports and results of other testing and review of referral information / records as far as provided in visit, I have established the following assessments:  1) the pleasure of meeting Omar Kennedy and Omar Kennedy today 74 year old Caucasian right-handed gentleman who presents with a history of untreated obstructive sleep apnea.  He was found in 2018 to have helped quite significant apnea but failed to tolerate CPAP because of nasal congestion and obstruction.  He underwent a sinus and nasal septum reconstruction surgery with Dr. Laural Benes, and since then has very good nasal patency.  It is now possible I think for him to try CPAP again should his diagnosis still be the same.  I have little doubt that apnea will still be present if it  was present in all less than 2 years ago however I will have to documented again in order to issue any kind of therapy and prescriptions.  Treatment of sleep apnea may also help him to reduce his nocturia, his bathroom breaks have the big difficulty fragmented his sleep and he has more trouble falling back to sleep than actually initiating sleep to begin of the night.  So a reduction of any arousals would help.  There is also mentioning of REM behavior enactment of dream sleep which correlates with a slight elevated muscle tone and mild cogwheeling.  I would like for him to take melatonin at night as it can help to reduce the frequency of  these dream enactment.  If it is not sufficient I would increase the dose from 2 to 5 mg but I would usually not to exceed 5 mg at night.  The next step would be a benzodiazepine a low-dose or a mild antidepressant which usually reduces REM sleep overall throughout the night.  I will order a specific sleep study with REM movement monitoring which also correlates with an larger EEG montage.  I will keep Dr. Karel JarvisAquino abreast with the developments I expect to see the patient again in about 2 months hopefully after the sleep study has been completed and interpreted.     My Plan is to proceed with:  1) REM BD montage , expanded EEG - PSG- we are looking at apnea, but also at REM BD, suspect Lewy Body   I would like to thank Patrcia DollyKaren Aquino, MD  and Marisue IvanLinthavong, [place] [address] [facility] [place],  [place] [id number] for allowing me to meet with and to take care of this pleasant patient.   In short, Cheri GuppyJohn S Channell is presenting with insomnia, nocturia untreated OSA and REM BD in the setting of a memory disorder.    CC: I will share my notes with PCP .  Electronically signed by: Melvyn Novasarmen Jeevan Kalla, MD 08/03/2019 10:11 AM  [facility] and [facility] Board certified by The [employer] Board of Sleep Medicine and Diplomate of the [employer] Academy of Sleep Medicine. Board certified In Neurology through the ABPN, Fellow of the [employer] Academy of Neurology. Medical Director of [facility].

## 2019-08-03 NOTE — Patient Instructions (Signed)

## 2019-08-23 ENCOUNTER — Ambulatory Visit (INDEPENDENT_AMBULATORY_CARE_PROVIDER_SITE_OTHER): Payer: Medicare PPO | Admitting: Neurology

## 2019-08-23 ENCOUNTER — Other Ambulatory Visit: Payer: Self-pay

## 2019-08-23 DIAGNOSIS — G3184 Mild cognitive impairment, so stated: Secondary | ICD-10-CM

## 2019-08-23 DIAGNOSIS — R351 Nocturia: Secondary | ICD-10-CM

## 2019-08-23 DIAGNOSIS — R6889 Other general symptoms and signs: Secondary | ICD-10-CM

## 2019-08-23 DIAGNOSIS — I25118 Atherosclerotic heart disease of native coronary artery with other forms of angina pectoris: Secondary | ICD-10-CM

## 2019-08-23 DIAGNOSIS — G4733 Obstructive sleep apnea (adult) (pediatric): Secondary | ICD-10-CM | POA: Diagnosis not present

## 2019-08-23 DIAGNOSIS — Z951 Presence of aortocoronary bypass graft: Secondary | ICD-10-CM

## 2019-08-25 ENCOUNTER — Ambulatory Visit: Payer: Medicare PPO

## 2019-09-04 NOTE — Procedures (Signed)
PATIENT'S NAME:  Omar Kennedy, Omar Kennedy DOB:      1945-09-29      MR#:    [id number]     DATE OF RECORDING: 08/23/2019 REFERRING M.D.:  Ellouise Newer, MD Study Performed:   Expanded REM Behavior Monitoring Polysomnogram HISTORY:  I have the pleasure of seeing Omar Kennedy, a right-handed, married Caucasian male with a possible sleep disorder. 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 His daughter is an Company secretary- he has been followed by the memory clinic at [facility].  He has also noted that his memory seems to have declined following an open- heart surgery in December 2018.  He was followed by Dr. Amada Kingfisher until his retirement. He has seen a counselor for behavior therapy. 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   The patient endorsed the Epworth Sleepiness Scale at 12 points.   The patient's weight 174 pounds with a height of 71 (inches), resulting in a BMI of 24.4 kg/m2. The patient's neck circumference measured 15 inches.  CURRENT MEDICATIONS: Tylenol, Vitamin C, ASA 81mg , Coreg, Ferrous sulfate, Flonase, Zestril, Melatonin, Multivitamin, Exelon, Crestor, Viagra, Flomax, Kenalog cream, Vit B12   PROCEDURE:  This is a multichannel digital polysomnogram utilizing the Somnostar 11.2 system.  Electrodes and sensors were applied and monitored per AASM Specifications.   EEG, EOG, Chin and Limb EMG, were sampled at 200 Hz.  ECG, Snore and Nasal Pressure, Thermal Airflow, Respiratory Effort, CPAP Flow and Pressure,  Oximetry was sampled at 50 Hz. Digital video and audio were recorded.      BASELINE STUDY: Lights Out was at 22:14 and Lights On at 05:03.  Total recording time (TRT) was 409.5 minutes, with a total sleep time (TST) of 106 minutes.   The patient's sleep latency was 60.5 minutes.  REM latency was 0 minutes.  The sleep efficiency was 25.9 %.     SLEEP ARCHITECTURE: WASO (Wake after sleep onset) was 211.5 minutes.  There were 7.5 minutes in Stage N1, 75.5 minutes Stage N2, 23 minutes Stage N3 and 0 minutes in Stage REM.  The percentage of Stage N1 was 7.1%, Stage N2 was 71.2%, Stage N3 was 21.7% and Stage R (REM sleep) was 0%.   RESPIRATORY ANALYSIS:  There were a total of 0 respiratory events:  0 obstructive apneas, 0 central apneas and 0 mixed apneas with a total of 0 apneas and an apnea index (AI) of 0 /hour. There were 0 hypopneas with a hypopnea index of 0 /hour. The patient also had 0 respiratory event related arousals (RERAs).   The total APNEA/HYPOPNEA INDEX (AHI) was 0/hour and the total RESPIRATORY DISTURBANCE INDEX was  0 /hour.  0 events occurred in REM sleep and 0 events in NREM. The REM AHI was  0 /hour, versus a non-REM AHI of 0. The patient spent 89.5 minutes of total sleep time in the supine position and 17 minutes in non-supine. The supine AHI was 0.0 versus a non-supine AHI of 0.0.  OXYGEN SATURATION & C02:  The Wake baseline 02 saturation was 98%, with the lowest being 91%. Time spent below 89% saturation equaled 0 minutes.  PERIODIC LIMB MOVEMENTS:   Audio and video analysis did not show any abnormal or unusual movements, behaviors, phonations or vocalizations.   EKG was in keeping with regular rhythm (NSR) with isolated extra-systoles.  IMPRESSION:  Very poor sleep efficiency, reduced sleep time.   1. No evidence of Obstructive Sleep Apnea (OSA), of snoring disorder.  2. No PLMs were recorded, the correlation to REM sleep was not possible.  3. Non-specific abnormal  EKG  RECOMMENDATIONS: while I can t rule out any form of sleep disordered breathing, I cannot comment on possible REM behavior due to lack of REM sleep in this study.  We usually expect PLMs in NREM to be present as well as in REM sleep to make that diagnosis.     I certify that I have reviewed the entire raw data recording prior to the issuance of this report in accordance with the Standards of Accreditation of the American Academy of Sleep Medicine (AASM)    Melvyn Novas, MD Diplomat, American Board of Psychiatry and Neurology  Diplomat, American Board of Sleep Medicine Wellsite geologist, [place] [facility] [employer]

## 2019-09-04 NOTE — Progress Notes (Signed)
IMPRESSION:   Very poor sleep efficiency, reduced sleep time.   1. No evidence of Obstructive Sleep Apnea (OSA), neither of snoring  disorder.  2. No PLMs were recorded, the correlation to REM sleep was not  Possible ( REM sleep void sleep architecture) .  3. Non-specific abnormal EKG   RECOMMENDATIONS: While I can rule out any form of sleep  disordered breathing, I cannot comment on possible REM behavior  due to lack of REM sleep in this study.  We usually expect PLMs in NREM to be present as well as in REM  sleep to make that diagnosis- there were none during this study and I interpreted this as an absence of REM BD.   Omar Kennedy does not need to follow up with our sleep clinic.

## 2019-09-05 ENCOUNTER — Other Ambulatory Visit: Payer: Self-pay | Admitting: Neurology

## 2019-09-05 ENCOUNTER — Encounter: Payer: Self-pay | Admitting: Neurology

## 2019-09-05 DIAGNOSIS — G4733 Obstructive sleep apnea (adult) (pediatric): Secondary | ICD-10-CM

## 2019-09-05 DIAGNOSIS — G4700 Insomnia, unspecified: Secondary | ICD-10-CM

## 2019-09-05 DIAGNOSIS — R6889 Other general symptoms and signs: Secondary | ICD-10-CM

## 2019-09-05 DIAGNOSIS — G4730 Sleep apnea, unspecified: Secondary | ICD-10-CM

## 2019-09-06 NOTE — Progress Notes (Signed)
Very short sleep recording time, but no evidence of PLMs, unfortunately no REM sleep was recorded. If patient or referring physician want a repeat study, I will offer it.

## 2019-09-15 ENCOUNTER — Other Ambulatory Visit: Payer: Self-pay | Admitting: Neurology

## 2019-09-20 ENCOUNTER — Ambulatory Visit (INDEPENDENT_AMBULATORY_CARE_PROVIDER_SITE_OTHER): Payer: Medicare PPO | Admitting: Neurology

## 2019-09-20 ENCOUNTER — Other Ambulatory Visit: Payer: Self-pay

## 2019-09-20 DIAGNOSIS — R6889 Other general symptoms and signs: Secondary | ICD-10-CM

## 2019-09-20 DIAGNOSIS — G4733 Obstructive sleep apnea (adult) (pediatric): Secondary | ICD-10-CM

## 2019-09-20 DIAGNOSIS — G4700 Insomnia, unspecified: Secondary | ICD-10-CM

## 2019-09-20 DIAGNOSIS — G4730 Sleep apnea, unspecified: Secondary | ICD-10-CM

## 2019-09-26 DIAGNOSIS — G4730 Sleep apnea, unspecified: Secondary | ICD-10-CM | POA: Insufficient documentation

## 2019-09-26 NOTE — Procedures (Signed)
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 His daughter is an interventional radiologist-he has been followed by the memory clinic at [facility].  He has also noted that his memory seems to have declined following an open- heart surgery in December 2018.  He was followed by Dr. Amada Kingfisher until his retirement. He has seen a counselor for behavior therapy.  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 He has 3-4 times nocturia, he screams and calls out in his sleep, kicks in his sleep and is not refreshed, remaining excessively daytime sleepy.      Summary & Diagnosis:    Tis patient has only very mild sleep apnea at AHI 5.9/h, which exacerbated in REM sleep to 15/h. there were no clinically relevant oxygen desaturations and his heart rate trends to bradycardia.   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  In a patient with coronary artery disease, neurocognitive disorder, and previously diagnosed OSA this may indicate improvement  of the underlying apnea. CPAP is indicated, given the named comorbidities.  If the patient is cognitively able to use CPAP, I recommend an attended titration sleep study, which was previously denied. Neurodegenerative disorders tend to produce central apneas. Interpreting Physician: Larey Seat, MD             Sleep Summary    Oxygen Saturation Statistics     Start Study Time: End Study Time: Total Recording Time:          10:21:53 PM 4:25:52 AM         6 h, 3 min  Total Sleep Time % REM of Sleep Time:  5 h, 20 min  24.4    Mean: 95 Minimum: 89 Maximum: 99  Mean of Desaturations Nadirs (%):  92  Oxygen Desaturation. %: 4-9 10-20 >20 Total  Events Number Total  12 100.0 0 0  0.0 0.0  12 100.0  Oxygen Saturation: <90 <=88  <85 <80 <70  Duration (minutes): Sleep % 0.1 0.0 0.0 0.0  0.0 0.0  0.0 0.0 0.0 0.0     Respiratory Indices      Total Events REM NREM All Night  pRDI:  45 pAHI:  29 ODI:  12  pAHIc:  0  % CSR: 0.0 21.0 15.0 4.0 0.0 6.1 3.6 2.0 0.0 9.1 5.9 2.4 0.0       Pulse Rate Statistics during Sleep (BPM)      Mean:  61 Minimum: 47 Maximum: 88    Indices are calculated using technically valid sleep time of  4 h, 56  min. pRDI/pAHI are calculated using oxi desaturations ? 3%  Body Position Statistics  Position Supine Prone Right Left Non-Supine  Sleep (min) 293.5 0.0 10.5 17.0 27.5  Sleep % 91.4 0.0 3.3 5.3 8.6  pRDI 9.2 N/A N/A 8.1 7.3  pAHI 6.0 N/A N/A 4.0 3.7  ODI 2.6 N/A N/A 0.0 0.0     Snoring Statistics Snoring Level (dB) >40 >50 >60 >70 >80 >Threshold (45)  Sleep (min) 235.3 3.1 0.9 0.0 0.0 18.6  Sleep % 73.3 1.0 0.3 0.0 0.0 5.8    Mean: 42 dB Sleep Stages Chart

## 2019-09-26 NOTE — Progress Notes (Signed)
Summary & Diagnosis:   Tis patient has only very mild sleep apnea at AHI 5.9/h, which  exacerbated in REM sleep to 15/h. there were no clinically  relevant oxygen desaturations and his heart rate trends to  bradycardia.   Recommendations:    This HST will not address REM behavior disorder but indicated at  least some mild OSA with REM sleep exacerbation.  In a patient with coronary artery disease, neurocognitive  disorder, and previously diagnosed OSA this may indicate  improvement of the underlying apnea. CPAP is indicated, given the  named comorbidities.  PS: If the patient is cognitively able to use CPAP, I recommend an  attended titration sleep study, which was previously denied.  Neurodegenerative disorders tend to produce central apneas.  Interpreting Physician: Melvyn Novas, MD

## 2019-09-26 NOTE — Telephone Encounter (Signed)
The HST showed very mild apnea and I consider CPAP rather optional , it was very mild- if the patient is willing to try it, I have given an order for in lab titration. If the patient wants auto CPAP , I would be happy to provide 5-12 cm water 2 cm EPR , mask of choice for him.  I also feel that it would be OK not to use CPAP.

## 2019-09-27 ENCOUNTER — Telehealth: Payer: Self-pay | Admitting: Neurology

## 2019-09-27 NOTE — Telephone Encounter (Signed)
Called the patient and spoke to him and his wife. I have reviewed the sleep study results with them in detail. Advised that we could attempt treating this with a CPAP or because of it being so mild she would be fine not using a CPAP also. Encouraged the patient to avoid sleeping in the supine position because when he was on his side the apnea was under 5. They have asked a copy be send to them in the mail and they will review and reach back out. They verbalized understanding and had no further questions.

## 2019-09-27 NOTE — Telephone Encounter (Signed)
-----   Message from Melvyn Novas, MD sent at 09/26/2019 11:14 AM EST ----- Summary & Diagnosis:   Tis patient has only very mild sleep apnea at AHI 5.9/h, which  exacerbated in REM sleep to 15/h. there were no clinically  relevant oxygen desaturations and his heart rate trends to  bradycardia.   Recommendations:    This HST will not address REM behavior disorder but indicated at  least some mild OSA with REM sleep exacerbation.  In a patient with coronary artery disease, neurocognitive  disorder, and previously diagnosed OSA this may indicate  improvement of the underlying apnea. CPAP is indicated, given the  named comorbidities.  PS: If the patient is cognitively able to use CPAP, I recommend an  attended titration sleep study, which was previously denied.  Neurodegenerative disorders tend to produce central apneas.  Interpreting Physician: Melvyn Novas, MD

## 2019-11-30 ENCOUNTER — Telehealth: Payer: Self-pay | Admitting: Neurology

## 2019-11-30 DIAGNOSIS — G4733 Obstructive sleep apnea (adult) (pediatric): Secondary | ICD-10-CM

## 2019-11-30 NOTE — Telephone Encounter (Signed)
I called the pt's wife back to discuss her concern as the phone message didn't make sense. She was stating his memory was declining and she thinks it would be worth starting the auto CPAP. I advised pt's wife that an order will be sent to a DME, [facility], and [facility] will call the pt within about one week after they file with the pt's insurance. [facility] will show the pt how to use the machine, fit for masks, and troubleshoot the CPAP if needed. A follow up appt was made for insurance purposes with Butch Penny, NP on July 29,2021 at 10 am. Pt verbalized understanding to arrive 15 minutes early and bring their CPAP. A letter with all of this information in it will be mailed to the pt as a reminder. I verified with the pt that the address we have on file is correct. Pt verbalized understanding of results. Pt had no questions at this time but was encouraged to call back if questions arise. I have sent the order to [facility] and have received confirmation that they have received the order.

## 2019-11-30 NOTE — Telephone Encounter (Signed)
Pt's wife [name] called stating that the pt was to get another sleep study done but they are wanting to know if he will be able to do it at home due to the trouble he had last time when he tried to stay in the lab. Please advise.

## 2019-12-05 ENCOUNTER — Other Ambulatory Visit: Payer: Self-pay | Admitting: Neurology

## 2019-12-14 ENCOUNTER — Encounter (HOSPITAL_COMMUNITY): Payer: Self-pay | Admitting: Emergency Medicine

## 2019-12-14 ENCOUNTER — Emergency Department (HOSPITAL_COMMUNITY)
Admission: EM | Admit: 2019-12-14 | Discharge: 2019-12-14 | Disposition: A | Discharge status: Home / Self Care | Payer: Medicare PPO | Attending: Emergency Medicine | Admitting: Emergency Medicine

## 2019-12-14 DIAGNOSIS — Z87891 Personal history of nicotine dependence: Secondary | ICD-10-CM | POA: Diagnosis not present

## 2019-12-14 DIAGNOSIS — I2510 Atherosclerotic heart disease of native coronary artery without angina pectoris: Secondary | ICD-10-CM | POA: Diagnosis not present

## 2019-12-14 DIAGNOSIS — Z79899 Other long term (current) drug therapy: Secondary | ICD-10-CM | POA: Insufficient documentation

## 2019-12-14 DIAGNOSIS — I10 Essential (primary) hypertension: Secondary | ICD-10-CM | POA: Insufficient documentation

## 2019-12-14 DIAGNOSIS — Z951 Presence of aortocoronary bypass graft: Secondary | ICD-10-CM | POA: Insufficient documentation

## 2019-12-14 DIAGNOSIS — R42 Dizziness and giddiness: Secondary | ICD-10-CM

## 2019-12-14 LAB — CBC
HCT: 42.4 % (ref 39.0–52.0)
Hemoglobin: 14.4 g/dL (ref 13.0–17.0)
MCH: 31.2 pg (ref 26.0–34.0)
MCHC: 34 g/dL (ref 30.0–36.0)
MCV: 91.8 fL (ref 80.0–100.0)
Platelets: 221 10*3/uL (ref 150–400)
RBC: 4.62 MIL/uL (ref 4.22–5.81)
RDW: 12.7 % (ref 11.5–15.5)
WBC: 7 10*3/uL (ref 4.0–10.5)
nRBC: 0 % (ref 0.0–0.2)

## 2019-12-14 LAB — BASIC METABOLIC PANEL
Anion gap: 8 (ref 5–15)
BUN: 16 mg/dL (ref 8–23)
CO2: 27 mmol/L (ref 22–32)
Calcium: 9 mg/dL (ref 8.9–10.3)
Chloride: 103 mmol/L (ref 98–111)
Creatinine, Ser: 0.98 mg/dL (ref 0.61–1.24)
GFR calc Af Amer: >60 mL/min (ref >60)
GFR calc non Af Amer: >60 mL/min (ref >60)
Glucose, Bld: 97 mg/dL (ref 70–99)
Potassium: 4.1 mmol/L (ref 3.5–5.1)
Sodium: 138 mmol/L (ref 135–145)

## 2019-12-14 MED ORDER — SODIUM CHLORIDE 0.9% FLUSH: 3.0000 mL | Freq: Once | INTRAVENOUS | Status: DC

## 2019-12-14 NOTE — ED Notes (Signed)
Patient verbalizes understanding of discharge instructions. Opportunity for questioning and answers were provided. Armband removed by staff, pt discharged from ED and ambulated to lobby to wait for family. 

## 2019-12-14 NOTE — ED Triage Notes (Signed)
Pt reports one episode of dizziness 1 week ago and then againTuesday, states he felt like the room was spinning around him. States during both episodes, the dizziness eventually subsided. Denies any other associated symptoms, speech clear, a/ox4, moves all limbs equally.

## 2019-12-15 ENCOUNTER — Telehealth: Payer: Self-pay | Admitting: Neurology

## 2019-12-15 NOTE — Telephone Encounter (Signed)
I saw the report, we don't have access to the images. They were comparing it to an older study in their system from 2017. His MRI brain in November showed similar changes, no clear new findings. Thanks

## 2019-12-15 NOTE — Telephone Encounter (Signed)
Patient's wife called in and wanted to see if the patient could be seen earlier than his scheduled appointment on 12/26/19. She stated he has been having memory issues and bad vertigo. Went to [facility] ED and [facility] ED yesterday.

## 2019-12-15 NOTE — Telephone Encounter (Signed)
Can put on cancellation list, but that is the earliest available. Can also try reaching out to PCP if they have anything earlier to address the vertigo. Thanks

## 2019-12-15 NOTE — Telephone Encounter (Signed)
Spoke with pt wife informed her that we could put him on cancellation list, pt wife asking if Dr. Karel Jarvis had seen the CT from [facility], Ct was pulled from care everywhere,

## 2019-12-15 NOTE — Telephone Encounter (Signed)
Pt wife called informed Dr Karel Jarvis seen CT report from [facility]

## 2019-12-19 NOTE — Telephone Encounter (Signed)
Message from [place] w/ [facility]: Patient previously had a CPAP and failed compliance in 2019. Since he has Medicare, we would now need a qualifying PSG in order to provide a CPAP. Unfortunately, patient's recent PSG shows an AHI of 0. They did a home sleep test as well, but since he failed compliance in the past we can't use it.

## 2019-12-19 NOTE — Telephone Encounter (Signed)
I have reached out to [employer]. Will update when I hear back.

## 2019-12-19 NOTE — Telephone Encounter (Signed)
The patient had still mild sleep apnea at AHI 5.9/h - this is enough to justify CPAP .

## 2019-12-19 NOTE — Telephone Encounter (Signed)
Pt wife called to advise she was informed by [facility] they are needing more clinical information before they can move forward with cpap

## 2019-12-21 NOTE — Telephone Encounter (Signed)
A patient with an AHI of zero is not in need of CPAP.  HST and PSG were done ?

## 2019-12-21 NOTE — ED Provider Notes (Signed)
[facility] EMERGENCY DEPARTMENT Provider Note   CSN: [id number] Arrival date & time: 12/14/19  1045     History Chief Complaint  Patient presents with  . Dizziness    Omar Kennedy is a 74 y.o. male.  HPI   74-year male with dizziness.  He had an episode about a week ago that resolved.  Then yesterday he had the same sensation.  He had sensation that room was spinning/moving.  Felt off balance.  Mild nausea.  No vomiting.  No acute visual changes or numbness, tingling or focal loss of strength.  No pain.    Past Medical History:  Diagnosis Date  . Arthritis   . Coronary artery disease involving native coronary artery of native heart 05/27/2017  . Deviated septum   . HTN (hypertension) 08/12/2017  . Hypercholesteremia   . Hypercholesterolemia 12/10/2015  . Hypertension   . 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/2015  . Nasal turbinate hypertrophy   . Obstructive sleep apnea 06/2018   Not on CPAP until after sinus surgery; positional therapy not helpful  . 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    Patient Active Problem List   Diagnosis Date Noted  . Sleep apnea 09/26/2019  . OSA (obstructive sleep apnea) 08/03/2019  . Nocturia more than twice per night 08/03/2019  . 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  . Vivid dream 08/03/2019  . 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 HTN (hypertension) 08/12/2017  . 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 Coronary artery disease involving native coronary artery of native heart 05/27/2017  . Elevated blood-pressure reading without diagnosis of hypertension 12/10/2015  . Hypercholesterolemia 12/10/2015  . 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/2015  . Nasal polyps 12/10/2015  . Poorly-controlled hypertension 12/10/2015  . Enlarged prostate 05/20/2011  . Insomnia 05/20/2011    Past Surgical History:  Procedure Laterality Date  .  CARDIAC SURGERY    . CORONARY ARTERY BYPASS GRAFT  2018  . ENDOSCOPIC CONCHA BULLOSA RESECTION Bilateral 08/02/2018   Procedure: ENDOSCOPIC BILATERAL CONCHA BULLOSA RESECTION;  Surgeon: Newman Pies, MD;  Location: [facility];  Service: ENT;  Laterality: Bilateral;  . ETHMOIDECTOMY Bilateral 08/02/2018   Procedure: TOTAL ETHMOIDECTOMY AND SPHENOIDECTOMY;  Surgeon: Newman Pies, MD;  Location: [facility];  Service: ENT;  Laterality: Bilateral;  . EXCISION MORTON'S NEUROMA Left   . FRONTAL SINUS EXPLORATION Bilateral 08/02/2018   Procedure: ENDOSCOPIC BILATERAL FRONTAL RECESS EXPLORATION;  Surgeon: Newman Pies, MD;  Location: [facility];  Service: ENT;  Laterality: Bilateral;  . HERNIA REPAIR    . MAXILLARY ANTROSTOMY Bilateral 08/02/2018   Procedure: ENDOSCOPIC BILATERAL MAXILLARY ANTROSTOMY WITH TISSUE REMOVAL;  Surgeon: Newman Pies, MD;  Location: [facility];  Service: ENT;  Laterality: Bilateral;  . NASAL SEPTOPLASTY W/ TURBINOPLASTY Bilateral 08/02/2018   Procedure: NASAL SEPTOPLASTY WITH BILATERAL TURBINATE REDUCTION;  Surgeon: Newman Pies, MD;  Location:  SURGERY CENTER;  Service: ENT;  Laterality: Bilateral;  . SINUS ENDO WITH FUSION Bilateral 08/02/2018   Procedure: SINUS ENDO WITH FUSION;  Surgeon: Newman Pies, MD;  Location:  SURGERY CENTER;  Service: ENT;  Laterality: Bilateral;  . umbilical surgery         Family History  Problem Relation Age of Onset  . Stroke Mother     Social History   Tobacco Use  . Smoking status: Former Smoker  Quit date: 45    Years since quitting: 41.4  . Smokeless tobacco: Never Used  Substance Use Topics  . Alcohol use: Yes    Alcohol/week: 7.0 standard drinks    Types: 7 Cans of beer per week    Comment: social  . Drug use: Never    Home Medications Prior to Admission medications   Medication Sig Start Date End Date Taking? Authorizing Provider  acetaminophen (TYLENOL) 325 MG  tablet Take by mouth. 06/21/17   [provider]  ALPRAZolam (XANAX) 0.25 MG tablet TAKE 1 TABLET (0.25 MG TOTAL) BY MOUTH AT BEDTIME AS NEEDED FOR ANXIETY. 12/06/19   Dohmeier, Porfirio Mylar, MD  ascorbic acid (VITAMIN C) 500 MG tablet Take by mouth.    [provider]  aspirin 81 MG chewable tablet Chew 81 mg by mouth daily.    [provider]  calcium-vitamin D (OSCAL WITH D) 500-200 MG-UNIT tablet Take 1 tablet by mouth.    [provider]  carvedilol (COREG) 12.5 MG tablet Take 0.5 mg by mouth daily. Verified takes 0.5mg  daily 12/09/17 09/30/19  [provider]  ferrous sulfate 325 (65 FE) MG EC tablet Take 325 mg by mouth daily with breakfast.     [provider]  fluticasone (FLONASE) 50 MCG/ACT nasal spray Place into the nose. 04/02/17   [provider]  lisinopril (ZESTRIL) 20 MG tablet Take 20 mg by mouth daily. 06/29/19   [provider]  Melatonin 10 MG TABS Take by mouth. Takes 3 mg tab at night    [provider]  Multiple Vitamin (MULTIVITAMIN WITH MINERALS) TABS tablet Take 1 tablet by mouth daily.    [provider]  rivastigmine (EXELON) 3 MG capsule Take 1 capsule (3 mg total) by mouth 2 (two) times daily. 05/11/19   Van Clines, MD  rosuvastatin (CRESTOR) 40 MG tablet TAKE ONE TABLET BY MOUTH AT NIGHT 07/23/17   [provider]  sildenafil (VIAGRA) 100 MG tablet Take 100 mg by mouth as needed. 02/07/19   [provider]  Tamsulosin HCl (FLOMAX PO) Take 0.4 mg by mouth daily.     [provider]  vitamin B-12 (CYANOCOBALAMIN) 1000 MCG tablet Take 1,000 mcg by mouth daily.    [provider]    Allergies    Tetracycline, Amoxicillin-pot clavulanate, and Wasp venom  Review of Systems   Review of Systems All systems reviewed and negative, other than as noted in HPI. Physical Exam Updated Vital Signs BP (!) 180/66 (BP Location: Right Arm)   Pulse (!) 56   Temp  97.8 F (36.6 C) (Oral)   Resp 13   Ht 5\' 11"  (1.803 m)   Wt 78.5 kg   SpO2 100%   BMI 24.13 kg/m   Physical Exam Vitals and nursing note reviewed.  Constitutional:      General: He is not in acute distress.    Appearance: He is well-developed.  HENT:     Head: Normocephalic and atraumatic.  Eyes:     General:        Right eye: No discharge.        Left eye: No discharge.     Conjunctiva/sclera: Conjunctivae normal.  Cardiovascular:     Rate and Rhythm: Normal rate and regular rhythm.     Heart sounds: Normal heart sounds. No murmur. No friction rub. No gallop.   Pulmonary:     Effort: Pulmonary effort is normal. No respiratory distress.     Breath  sounds: Normal breath sounds.  Abdominal:     General: There is no distension.     Palpations: Abdomen is soft.     Tenderness: There is no abdominal tenderness.  Musculoskeletal:        General: No tenderness.     Cervical back: Neck supple.  Skin:    General: Skin is warm and dry.  Neurological:     Mental Status: He is alert.     Cranial Nerves: No cranial nerve deficit.     Sensory: No sensory deficit.     Motor: No weakness.     Coordination: Coordination normal.  Psychiatric:        Behavior: Behavior normal.        Thought Content: Thought content normal.     ED Results / Procedures / Treatments   Labs (all labs ordered are listed, but only abnormal results are displayed) Labs Reviewed  BASIC METABOLIC PANEL  CBC    EKG EKG Interpretation  Date/Time:  Thursday Dec 14 2019 10:46:17 EDT Ventricular Rate:  57 PR Interval:  188 QRS Duration: 78 QT Interval:  392 QTC Calculation: 381 R Axis:   -7 Text Interpretation: Sinus bradycardia Otherwise normal ECG Confirmed by Ronnald Nian, Adam ([id number]) on 12/15/2019 1:39:00 PM   Radiology No results found.  Procedures Procedures (including critical care time)  Medications Ordered in ED Medications - No data to display  ED Course  I have reviewed the triage  vital signs and the nursing notes.  Pertinent labs & imaging results that were available during my care of the patient were reviewed by me and considered in my medical decision making (see chart for details).    MDM Rules/Calculators/A&P                      74 year old male with symptoms consistent with peripheral vertigo.  Doubt central etiology.  As needed meclizine.  Return precautions discussed.  Outpatient follow-up.  Final Clinical Impression(s) / ED Diagnoses Final diagnoses:  Vertigo    Rx / DC Orders ED Discharge Orders    None       Virgel Manifold, MD 12/21/19 1016

## 2019-12-25 ENCOUNTER — Telehealth: Payer: Self-pay | Admitting: Neurology

## 2019-12-25 NOTE — Telephone Encounter (Signed)
I know there has been a lot of messages about this.  I spoke to [facility]- sleep lab manager-  Pt attempted a cpap study on 12/20/18 the study was ended after 140 minutes due to pt not being able to tolerate cpap.  [facility] is needing an addendum to the note with this information - the patient was intolerant of CPAP and the in-lab study was ended early- we couldn't bill for that-  then followed by a HST for this reason, to allow him a trial at home. This way the HST can hopefully count as baseline for need to start CPAP.   Melvyn Novas, MD

## 2019-12-25 NOTE — Telephone Encounter (Signed)
If [facility] calls back on this patient, the technical notes of in lab study is under media. Total sleep time was 1.8 hrs. This is why he needed one at home. Maybe they can look at this.

## 2019-12-25 NOTE — Telephone Encounter (Signed)
I have reached out to [facility] with this information so they can start processing the order.

## 2019-12-26 ENCOUNTER — Encounter: Payer: Self-pay | Admitting: Neurology

## 2019-12-26 ENCOUNTER — Other Ambulatory Visit: Payer: Self-pay

## 2019-12-26 ENCOUNTER — Ambulatory Visit: Payer: Medicare PPO | Admitting: Neurology

## 2019-12-26 VITALS — BP 128/66 | HR 63 | Ht 71.0 in | Wt 165.2 lb

## 2019-12-26 DIAGNOSIS — G4700 Insomnia, unspecified: Secondary | ICD-10-CM

## 2019-12-26 DIAGNOSIS — F01A0 Vascular dementia, mild, without behavioral disturbance, psychotic disturbance, mood disturbance, and anxiety: Secondary | ICD-10-CM

## 2019-12-26 DIAGNOSIS — F0150 Vascular dementia without behavioral disturbance: Secondary | ICD-10-CM | POA: Diagnosis not present

## 2019-12-26 MED ORDER — RIVASTIGMINE TARTRATE 6 MG PO CAPS: 6.0000 mg | ORAL_CAPSULE | Freq: Two times a day (BID) | ORAL | 3 refills | Status: DC

## 2019-12-26 MED ORDER — MIRTAZAPINE 15 MG PO TABS: 15.0000 mg | ORAL_TABLET | Freq: Every day | ORAL | 11 refills | Status: DC

## 2019-12-26 NOTE — Progress Notes (Signed)
NEUROLOGY FOLLOW UP OFFICE NOTE  Omar Kennedy [id number] 07-17-1946  HISTORY OF PRESENT ILLNESS: I had the pleasure of seeing Omar Kennedy in follow-up in the neurology clinic on 12/26/2019.  The patient was last seen 7 months ago for memory loss. He is again accompanied by his wife who helps supplement the history today.  Records and images were personally reviewed where available. I personally reviewed MRI brain done 05/2019 which showed generalized atrophy and ventricular enlargement stable since 2017, mild chronic microvascular disease. He underwent Neuropsychological testing in 06/2019 indicating moderate frontal-subcortical dysfunction, most consistent with history of various cardiovascular ailments and neuroimaging suggesting small vessel disease. He was diagnosed with Mild Vascular Neurocognitive Disorder. He also reported moderate levels of sleep dysfunction. He has been seeing sleep specialist Dr. Vickey Huger and continues to await news from [facility]. His wife reports his memory is getting much worse, he struggles with words, "just won't make a sentence." He repeats himself. He feels his memory is going downhill, he is not very happy with his memory situation. He struggles a little using the phone and computer, saying the phone is broken. He cannot remember how to send email or print an email, which he used to do easily in the past. No difficulties following instructions. He has trouble with bills, his wife helps. His wife fixes his pills and reminds him. He denies getting lost driving. He takes Xanax 1/2-1 tab qhs to help with sleep, and feels that he sleeps through the night except to use the bathroom. Trazodone did not help, he took Unisom for years. He has lost weight, forgetting to eat. He is not eating regularly, saying he stays busy. His wife notes he gets very irritated with her, saying she talks too fast. No paranoia or hallucinations. He had a fall due to vertigo, he thinks it was very hot  and he had been working outside. He had 2 episodes of vertigo, vomited half the night, and went to the ER 2 days in May.    History on Initial Assessment 05/11/2019: This is a 74 year old ambidextrous right-hand dominant man with a history of hypertension, hyperlipidemia, CAD, OSA, presenting for evaluation of memory loss. He states his memory is "bad." He started noticing changes a year ago, his wife started noticing changes 1-2 years ago. He reports heart surgery 2 years ago and feels his memory was fine after, his wife started noticing changes even before the heart surgery, but after his surgery, symptoms progressively worsened. He could not think of names of things. He denies getting lost driving, his wife states he is a little forgetful where to turn. His wife started managing his pillbox and bills after the surgery. 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 He did their taxes last year but took a long time, when he stopped he would not remember what he last did. 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 They search daily for things, he states he has always been absent-minded. He was evaluated at [facility] in July 2019, MOCA 27/30, diagnosed with Mild Cognitive Impairment. He had diarrhea on Donepezil and has been taking Rivastigmine 1.5mg  BID over the past year with no side effects. He has been evaluated at the [facility] with mild OSA, currently on positional therapy getting 6-7 hours of sleep which is "10x better." His wife reports that when he gets good sleep, memory is better.  His wife states he is a lot more irritable, worse after the surgery. No paranoia or hallucinations.His paternal grandfather had memory issues. No history of concussions. He drinks 1-2 beers daily.   He denies any headaches, dizziness, diplopia, bowel/bladder dysfunction, anosmia, or tremors.He has back pain and numbness and tingling in both  hands, L>R. His fingers and toes get cold easily and change color. No significant falls.   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     ALPRAZolam (XANAX) 0.25 MG tablet TAKE 1 TABLET (0.25 MG TOTAL) BY MOUTH AT BEDTIME AS NEEDED FOR ANXIETY. 30 tablet 0   ascorbic acid (VITAMIN C) 500 MG tablet Take by mouth.     aspirin 81 MG chewable tablet Chew 81 mg by mouth daily.     calcium-vitamin D (OSCAL WITH D) 500-200 MG-UNIT tablet Take 1 tablet by mouth.     carvedilol (COREG) 12.5 MG tablet Take 0.5 mg by mouth daily. Verified takes 0.5mg  daily     ferrous sulfate 325 (65 FE) MG EC tablet Take 325 mg by mouth daily with breakfast.      fluticasone (FLONASE) 50 MCG/ACT nasal spray Place into the nose.     lisinopril (ZESTRIL) 20 MG tablet Take 20 mg by mouth daily.     Melatonin 10 MG TABS Take by mouth. Takes 3 mg tab at night     Multiple Vitamin (MULTIVITAMIN WITH MINERALS) TABS tablet Take 1 tablet by mouth daily.     rivastigmine (EXELON) 3 MG capsule Take 1 capsule (3 mg total) by mouth 2 (two) times daily. 180 capsule 3   rosuvastatin (CRESTOR) 40 MG tablet TAKE ONE TABLET BY MOUTH AT NIGHT  2   sildenafil (VIAGRA) 100 MG tablet Take 100 mg by mouth as needed.     vitamin  B-12 (CYANOCOBALAMIN) 1000 MCG tablet Take 1,000 mcg by mouth daily.     No current facility-administered medications on file prior to visit.    ALLERGIES: Allergies  Allergen Reactions   Tetracycline Other (See Comments)    VERTIGO   Amoxicillin-Pot Clavulanate Nausea Only   Wasp Venom Swelling    FAMILY HISTORY: Family History  Problem Relation Age of Onset   Stroke Mother     SOCIAL HISTORY: Social History   Socioeconomic History   Marital status: Married    Spouse name: Not on file   Number of children: Not on file   Years of education: 20   Highest education level: Doctorate  Occupational History   Not on file  Tobacco Use   Smoking status: Former Smoker    Quit date: 1980    Years since quitting: 41.4   Smokeless tobacco: Never Used  Substance and Sexual Activity   Alcohol use: Yes    Alcohol/week:  7.0 standard drinks    Types: 7 Cans of beer per week    Comment: social   Drug use: Never   Sexual activity: Not on file  Other Topics Concern   Not on file  Social History Narrative   Lives with wife in one level   R handed   Caffeine - not much   Exercise - not much - 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    Social Determinants of Corporate investment banker Strain:    Difficulty of Paying Living Expenses:   Food Insecurity:    Worried About Programme researcher, broadcasting/film/video in the Last Year:    Barista in the Last Year:   Transportation Needs:    Freight forwarder (Medical):    Lack of Transportation (Non-Medical):   Physical Activity:    Days of Exercise per Week:    Minutes of Exercise per Session:   Stress:    Feeling of Stress :   Social Connections:    Frequency of Communication with Friends and Family:    Frequency of Social Gatherings with Friends and Family:    Attends Religious Services:    Active Member of Clubs or Organizations:    Attends Engineer, structural:      Marital Status:   Intimate Partner Violence:    Fear of Current or Ex-Partner:    Emotionally Abused:    Physically Abused:    Sexually Abused:      PHYSICAL EXAM: Vitals:   12/26/19 1138  BP: 128/66  Pulse: 63  SpO2: 97%   General: No acute distress Head:  Normocephalic/atraumatic Skin/Extremities: No rash, no edema Neurological Exam: alert and oriented to person, place, and time. No aphasia or dysarthria. Fund of knowledge is appropriate.  Recent and remote memory are impaired.  Attention and concentration are normal.    Able to name objects and repeat phrases.  MMSE 26/30. MMSE - Mini Mental State Exam 12/26/2019  Orientation to time 4  Orientation to Place 4  Registration 3  Attention/ Calculation 4  Recall 2  Language- name 2 objects 2  Language- repeat 1  Language- follow 3 step command 3  Language- read & follow direction 1  Write a sentence 1  Copy design 1  Total score 26    Cranial nerves: Pupils equal, round, reactive to light. Extraocular movements intact with no nystagmus. Visual fields full.  No facial asymmetry. Motor: Bulk and tone normal, muscle strength 5/5 throughout with no pronator drift.  Sensation to light touch, temperature and vibration intact.  No extinction to double simultaneous stimulation.  Deep tendon reflexes 2+ throughout, toes downgoing.  Finger to nose testing intact.  Gait slow and cautious, no ataxia  IMPRESSION: This is a 74 yo RH man with a history of  hypertension, hyperlipidemia, CAD, OSA, with Mild Vascular Cognitive Impairment on recent Neuropsychological evaluation last 06/2019. MRI brain showed diffuse atrophy and mild chronic microvascular disease. His wife continues to report progressive decline, we discussed increasing Rivastigmine to 6mg  BID. He is having more mood and sleep difficulties, as well as poor appetite. We discussed starting mirtazapine 15mg  qhs, side effects discussed. Continue follow-up with Dr. for OSA,  he is still awaiting news re: CPAP. Advised to hold on Xanax as he starts mirtazapine. Repeat Neurocognitive testing will be ordered for December 2021 to assess trajectory. Continue to monitor driving. We discussed the importance of control  of vascular risk factors, physical exercise and brain stimulation exercises for brain health. Follow-up after testing, they know to call for any changes.   Thank you for allowing me to participate in his care.  Please do not hesitate to call for any questions or concerns.   Ellouise Newer, M.D.   CC: Dr. Netty Starring

## 2019-12-26 NOTE — Patient Instructions (Addendum)
1. Increase Rivastigmine to 6mg  twice a day  2. Start mirtazapine 15mg : take 1 tablet every night  3. Would hold on the Xanax when you start the mirtazapine for sleep  4. Schedule repeat Neurocognitive testing in December 2021, then follow-up with me after   FALL PRECAUTIONS: Be cautious when walking. Scan the area for obstacles that may increase the risk of trips and falls. When getting up in the mornings, sit up at the edge of the bed for a few minutes before getting out of bed. Consider elevating the bed at the head end to avoid drop of blood pressure when getting up. Walk always in a well-lit room (use night lights in the walls). Avoid area rugs or power cords from appliances in the middle of the walkways. Use a walker or a cane if necessary and consider physical therapy for balance exercise. Get your eyesight checked regularly.  FINANCIAL OVERSIGHT: Supervision, especially oversight when making financial decisions or transactions is also recommended as difficulties progress.  HOME SAFETY: Consider the safety of the kitchen when operating appliances like stoves, microwave oven, and blender. Consider having supervision and share cooking responsibilities until no longer able to participate in those. Accidents with firearms and other hazards in the house should be identified and addressed as well.  DRIVING: Regarding driving, in patients with progressive memory problems, driving will be impaired. We advise to have someone else do the driving if trouble finding directions or if minor accidents are reported. Independent driving assessment is available to determine safety of driving.  ABILITY TO BE LEFT ALONE: If patient is unable to contact 911 operator, consider using LifeLine, or when the need is there, arrange for someone to stay with patients. Smoking is a fire hazard, consider supervision or cessation. Risk of wandering should be assessed by caregiver and if detected at any point, supervision  and safe proof recommendations should be instituted.  RECOMMENDATIONS FOR ALL PATIENTS WITH MEMORY PROBLEMS: 1. Continue to exercise (Recommend 30 minutes of walking everyday, or 3 hours every week) 2. Increase social interactions - continue going to [place] and enjoy social gatherings with friends and family 3. Eat healthy, avoid fried foods and eat more fruits and vegetables 4. Maintain adequate blood pressure, blood sugar, and blood cholesterol level. Reducing the risk of stroke and cardiovascular disease also helps promoting better memory. 5. Avoid stressful situations. Live a simple life and avoid aggravations. Organize your time and prepare for the next day in anticipation. 6. Sleep well, avoid any interruptions of sleep and avoid any distractions in the bedroom that may interfere with adequate sleep quality 7. Avoid sugar, avoid sweets as there is a strong link between excessive sugar intake, diabetes, and cognitive impairment We discussed the Mediterranean diet, which has been shown to help patients reduce the risk of progressive memory disorders and reduces cardiovascular risk. This includes eating fish, eat fruits and green leafy vegetables, nuts like almonds and hazelnuts, walnuts, and also use olive oil. Avoid fast foods and fried foods as much as possible. Avoid sweets and sugar as sugar use has been linked to worsening of memory function.  There is always a concern of gradual progression of memory problems. If this is the case, then we may need to adjust level of care according to patient needs. Support, both to the patient and caregiver, should then be put into place.

## 2019-12-28 ENCOUNTER — Inpatient Hospital Stay (HOSPITAL_COMMUNITY)
Admit: 2019-12-28 | Discharge: 2019-12-30 | DRG: 193 | Disposition: A | Discharge status: Home / Self Care | Payer: Medicare PPO | Attending: Family Medicine | Admitting: Family Medicine

## 2019-12-28 ENCOUNTER — Emergency Department (HOSPITAL_COMMUNITY): Payer: Medicare PPO

## 2019-12-28 ENCOUNTER — Encounter (HOSPITAL_COMMUNITY): Payer: Self-pay | Admitting: Pediatrics

## 2019-12-28 ENCOUNTER — Other Ambulatory Visit: Payer: Self-pay

## 2019-12-28 DIAGNOSIS — R590 Localized enlarged lymph nodes: Secondary | ICD-10-CM | POA: Diagnosis present

## 2019-12-28 DIAGNOSIS — R1114 Bilious vomiting: Secondary | ICD-10-CM | POA: Diagnosis present

## 2019-12-28 DIAGNOSIS — Z6823 Body mass index (BMI) 23.0-23.9, adult: Secondary | ICD-10-CM

## 2019-12-28 DIAGNOSIS — Z87891 Personal history of nicotine dependence: Secondary | ICD-10-CM

## 2019-12-28 DIAGNOSIS — J189 Pneumonia, unspecified organism: Secondary | ICD-10-CM | POA: Diagnosis not present

## 2019-12-28 DIAGNOSIS — I10 Essential (primary) hypertension: Secondary | ICD-10-CM | POA: Diagnosis present

## 2019-12-28 DIAGNOSIS — R410 Disorientation, unspecified: Secondary | ICD-10-CM

## 2019-12-28 DIAGNOSIS — Z951 Presence of aortocoronary bypass graft: Secondary | ICD-10-CM

## 2019-12-28 DIAGNOSIS — G9341 Metabolic encephalopathy: Secondary | ICD-10-CM | POA: Diagnosis present

## 2019-12-28 DIAGNOSIS — E782 Mixed hyperlipidemia: Secondary | ICD-10-CM | POA: Diagnosis present

## 2019-12-28 DIAGNOSIS — E860 Dehydration: Secondary | ICD-10-CM | POA: Diagnosis present

## 2019-12-28 DIAGNOSIS — G4733 Obstructive sleep apnea (adult) (pediatric): Secondary | ICD-10-CM | POA: Diagnosis present

## 2019-12-28 DIAGNOSIS — F01A0 Vascular dementia, mild, without behavioral disturbance, psychotic disturbance, mood disturbance, and anxiety: Secondary | ICD-10-CM | POA: Diagnosis present

## 2019-12-28 DIAGNOSIS — J690 Pneumonitis due to inhalation of food and vomit: Secondary | ICD-10-CM | POA: Diagnosis present

## 2019-12-28 DIAGNOSIS — Z823 Family history of stroke: Secondary | ICD-10-CM

## 2019-12-28 DIAGNOSIS — I2510 Atherosclerotic heart disease of native coronary artery without angina pectoris: Secondary | ICD-10-CM

## 2019-12-28 DIAGNOSIS — J219 Acute bronchiolitis, unspecified: Secondary | ICD-10-CM | POA: Diagnosis present

## 2019-12-28 DIAGNOSIS — F0150 Vascular dementia without behavioral disturbance: Secondary | ICD-10-CM | POA: Diagnosis present

## 2019-12-28 DIAGNOSIS — Z20822 Contact with and (suspected) exposure to covid-19: Secondary | ICD-10-CM | POA: Diagnosis present

## 2019-12-28 DIAGNOSIS — R634 Abnormal weight loss: Secondary | ICD-10-CM | POA: Diagnosis present

## 2019-12-28 LAB — COMPREHENSIVE METABOLIC PANEL
ALT: 21 U/L (ref 0–44)
AST: 19 U/L (ref 15–41)
Albumin: 4.3 g/dL (ref 3.5–5.0)
Alkaline Phosphatase: 61 U/L (ref 38–126)
Anion gap: 11 (ref 5–15)
BUN: 16 mg/dL (ref 8–23)
CO2: 25 mmol/L (ref 22–32)
Calcium: 9.4 mg/dL (ref 8.9–10.3)
Chloride: 103 mmol/L (ref 98–111)
Creatinine, Ser: 0.97 mg/dL (ref 0.61–1.24)
GFR calc Af Amer: >60 mL/min (ref >60)
GFR calc non Af Amer: >60 mL/min (ref >60)
Glucose, Bld: 121 mg/dL — ABNORMAL HIGH (ref 70–99)
Potassium: 3.5 mmol/L (ref 3.5–5.1)
Sodium: 139 mmol/L (ref 135–145)
Total Bilirubin: 2.5 mg/dL — ABNORMAL HIGH (ref 0.3–1.2)
Total Protein: 6.6 g/dL (ref 6.5–8.1)

## 2019-12-28 LAB — CBC WITH DIFFERENTIAL/PLATELET
Abs Immature Granulocytes: 0.03 10*3/uL (ref 0.00–0.07)
Basophils Absolute: 0 10*3/uL (ref 0.0–0.1)
Basophils Relative: 0 %
Eosinophils Absolute: 0 10*3/uL (ref 0.0–0.5)
Eosinophils Relative: 0 %
HCT: 50.4 % (ref 39.0–52.0)
Hemoglobin: 17.2 g/dL — ABNORMAL HIGH (ref 13.0–17.0)
Immature Granulocytes: 0 %
Lymphocytes Relative: 5 %
Lymphs Abs: 0.6 10*3/uL — ABNORMAL LOW (ref 0.7–4.0)
MCH: 30.9 pg (ref 26.0–34.0)
MCHC: 34.1 g/dL (ref 30.0–36.0)
MCV: 90.6 fL (ref 80.0–100.0)
Monocytes Absolute: 0.5 10*3/uL (ref 0.1–1.0)
Monocytes Relative: 4 %
Neutro Abs: 10.3 10*3/uL — ABNORMAL HIGH (ref 1.7–7.7)
Neutrophils Relative %: 91 %
Platelets: 245 10*3/uL (ref 150–400)
RBC: 5.56 MIL/uL (ref 4.22–5.81)
RDW: 12.6 % (ref 11.5–15.5)
WBC: 11.4 10*3/uL — ABNORMAL HIGH (ref 4.0–10.5)
nRBC: 0 % (ref 0.0–0.2)

## 2019-12-28 LAB — ETHANOL: Alcohol, Ethyl (B): <10 mg/dL (ref <10)

## 2019-12-28 MED ORDER — SODIUM CHLORIDE 0.9 % IV BOLUS
500.0000 mL | Freq: Once | INTRAVENOUS | Status: AC
  Administered 2019-12-28: 500 mL via INTRAVENOUS

## 2019-12-28 MED ORDER — SODIUM CHLORIDE 0.9 % IV SOLN: 1.0000 g | Freq: Once | INTRAVENOUS | Status: DC

## 2019-12-28 MED ORDER — SODIUM CHLORIDE 0.9 % IV SOLN
500.0000 mg | Freq: Once | INTRAVENOUS | Status: AC
  Administered 2019-12-29: 500 mg via INTRAVENOUS
  Filled 2019-12-28: qty 500

## 2019-12-28 MED ORDER — LORAZEPAM 2 MG/ML IJ SOLN
0.5000 mg | Freq: Once | INTRAMUSCULAR | Status: AC
  Administered 2019-12-28: 0.5 mg via INTRAVENOUS
  Filled 2019-12-28: qty 1

## 2019-12-28 NOTE — ED Provider Notes (Signed)
[facility] EMERGENCY DEPARTMENT Provider Note   CSN: [id number] Arrival date & time: 12/28/19  1846     History Chief Complaint  Patient presents with  . Nausea    Omar Kennedy is a 74 y.o. male.  HPI    Patient with a history of neurocognitive decline, now presents with concern for nausea, dizziness, vomiting. He notes that prior to about 2 weeks ago he was doing generally well.  Now, since that time he has had 3 distinct episodes of dizziness, with nausea, vomiting.  Following the first episode he improved, resting at home.  During this episode he presented to our affiliated facility, was seen and evaluated.  Now, from about 12 hours ago the patient has had persistent dizziness, nausea, has had multiple episodes of vomiting. He notes that his dizziness has improved, without clear intervention, but he remains nauseous, and vomited about 1 hour ago. No abdominal pain, no chest pain. Patient notes that he does take medication regularly. He notes a history of cognitive issues since prior surgery.  He notes that he has seen his neurologist in the last 2 days, though it is unclear if he discussed his dizziness. Since onset today, no clearly pitting or exacerbating factors. Past Medical History:  Diagnosis Date  . Arthritis   . Coronary artery disease involving native coronary artery of native heart 05/27/2017  . Dehydration   . Deviated septum   . HTN (hypertension) 08/12/2017  . Hypercholesteremia   . Hypercholesterolemia 12/10/2015  . Hypertension   . 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/2015  . Nasal turbinate hypertrophy   . Obstructive sleep apnea 06/2018   Not on CPAP until after sinus surgery; positional therapy not helpful  . 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 Vertigo     Patient Active Problem List   Diagnosis Date Noted  . Sleep apnea 09/26/2019  . OSA (obstructive sleep apnea) 08/03/2019  .  Nocturia more than twice per night 08/03/2019  . 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  . Vivid dream 08/03/2019  . 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 HTN (hypertension) 08/12/2017  . 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 Coronary artery disease involving native coronary artery of native heart 05/27/2017  . Elevated blood-pressure reading without diagnosis of hypertension 12/10/2015  . Hypercholesterolemia 12/10/2015  . 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/2015  . Nasal polyps 12/10/2015  . Poorly-controlled hypertension 12/10/2015  . Enlarged prostate 05/20/2011  . Insomnia 05/20/2011    Past Surgical History:  Procedure Laterality Date  . CARDIAC SURGERY    . CORONARY ARTERY BYPASS GRAFT  2018  . ENDOSCOPIC CONCHA BULLOSA RESECTION Bilateral 08/02/2018   Procedure: ENDOSCOPIC BILATERAL CONCHA BULLOSA RESECTION;  Surgeon: Leta Baptist, MD;  Location: [facility];  Service: ENT;  Laterality: Bilateral;  . ETHMOIDECTOMY Bilateral 08/02/2018   Procedure: TOTAL ETHMOIDECTOMY AND SPHENOIDECTOMY;  Surgeon: Leta Baptist, MD;  Location: [facility];  Service: ENT;  Laterality: Bilateral;  . EXCISION MORTON'S NEUROMA Left   . FRONTAL SINUS EXPLORATION Bilateral 08/02/2018   Procedure: ENDOSCOPIC BILATERAL FRONTAL RECESS EXPLORATION;  Surgeon: Leta Baptist, MD;  Location: [facility];  Service: ENT;  Laterality: Bilateral;  . HERNIA REPAIR    . MAXILLARY ANTROSTOMY Bilateral 08/02/2018   Procedure: ENDOSCOPIC BILATERAL MAXILLARY ANTROSTOMY WITH TISSUE REMOVAL;  Surgeon: Leta Baptist, MD;  Location: [facility];  Service: ENT;  Laterality: Bilateral;  . NASAL SEPTOPLASTY W/ TURBINOPLASTY Bilateral 08/02/2018   Procedure: NASAL SEPTOPLASTY WITH BILATERAL TURBINATE REDUCTION;  Surgeon: Newman Pies, MD;  Location: [facility];  Service: ENT;  Laterality: Bilateral;  . SINUS ENDO WITH FUSION Bilateral  08/02/2018   Procedure: SINUS ENDO WITH FUSION;  Surgeon: Newman Pies, MD;  Location: [facility];  Service: ENT;  Laterality: Bilateral;  . umbilical surgery         Family History  Problem Relation Age of Onset  . Stroke Mother     Social History   Tobacco Use  . Smoking status: Former Smoker    Quit date: 1980    Years since quitting: 41.4  . Smokeless tobacco: Never Used  Vaping Use  . Vaping Use: Never used  Substance Use Topics  . Alcohol use: Yes    Alcohol/week: 7.0 standard drinks    Types: 7 Cans of beer per week    Comment: social  . Drug use: Never    Home Medications Prior to Admission medications   Medication Sig Start Date End Date Taking? Authorizing Provider  acetaminophen (TYLENOL) 325 MG tablet Take by mouth. 06/21/17   [provider]  ALPRAZolam (XANAX) 0.25 MG tablet TAKE 1 TABLET (0.25 MG TOTAL) BY MOUTH AT BEDTIME AS NEEDED FOR ANXIETY. 12/06/19   Dohmeier, Porfirio Mylar, MD  ascorbic acid (VITAMIN C) 500 MG tablet Take by mouth.    [provider]  aspirin 81 MG chewable tablet Chew 81 mg by mouth daily.    [provider]  calcium-vitamin D (OSCAL WITH D) 500-200 MG-UNIT tablet Take 1 tablet by mouth.    [provider]  carvedilol (COREG) 12.5 MG tablet Take 0.5 mg by mouth daily. Verified takes 0.5mg  daily 12/09/17 12/26/19  [provider]  ferrous sulfate 325 (65 FE) MG EC tablet Take 325 mg by mouth daily with breakfast.     [provider]  fluticasone (FLONASE) 50 MCG/ACT nasal spray Place into the nose. 04/02/17   [provider]  lisinopril (ZESTRIL) 20 MG tablet Take 20 mg by mouth daily. 06/29/19   [provider]  Melatonin 10 MG TABS Take by mouth. Takes 3 mg tab at night    [provider]  mirtazapine (REMERON) 15 MG tablet Take 1 tablet (15 mg total) by mouth at bedtime. 12/26/19   Van Clines, MD  Multiple Vitamin (MULTIVITAMIN WITH MINERALS) TABS  tablet Take 1 tablet by mouth daily.    [provider]  rivastigmine (EXELON) 6 MG capsule Take 1 capsule (6 mg total) by mouth 2 (two) times daily. 12/26/19   Van Clines, MD  rosuvastatin (CRESTOR) 40 MG tablet TAKE ONE TABLET BY MOUTH AT NIGHT 07/23/17   [provider]  sildenafil (VIAGRA) 100 MG tablet Take 100 mg by mouth as needed. 02/07/19   [provider]  vitamin B-12 (CYANOCOBALAMIN) 1000 MCG tablet Take 1,000 mcg by mouth daily.    [provider]    Allergies    Tetracycline, Amoxicillin-pot clavulanate, and Wasp venom  Review of Systems   Review of Systems  Constitutional:       Per HPI, otherwise negative  HENT:       Per HPI, otherwise negative  Respiratory:       Per HPI, otherwise negative  Cardiovascular:       Per HPI, otherwise negative  Gastrointestinal: Positive for nausea and vomiting. Negative for abdominal pain.  Endocrine:       Negative aside from HPI  Genitourinary:       Neg aside from HPI   Musculoskeletal:       Per HPI, otherwise negative  Skin: Negative.   Neurological: Negative for syncope.    Physical Exam Updated Vital Signs BP (!) 150/103 (BP Location: Right Arm)   Pulse 67   Temp 97.6 F (36.4 C) (Oral)   Resp 16   Ht 5\' 11"  (1.803 m)   Wt 74.9 kg   SpO2 96%   BMI 23.04 kg/m   Physical Exam Vitals and nursing note reviewed.  Constitutional:      General: He is not in acute distress.    Appearance: Normal appearance. He is well-developed. He is not ill-appearing or diaphoretic.  HENT:     Head: Normocephalic and atraumatic.  Eyes:     Conjunctiva/sclera: Conjunctivae normal.  Neck:     Comments: Patient moving his neck freely throughout the exam and conversation, both laterally and vertically. Cardiovascular:     Rate and Rhythm: Normal rate and regular rhythm.  Pulmonary:     Effort: Pulmonary effort is normal. No respiratory distress.     Breath sounds: No stridor.  Abdominal:      General: There is no distension.  Skin:    General: Skin is warm and dry.  Neurological:     Mental Status: He is alert and oriented to person, place, and time.     Cranial Nerves: Dysarthria present.     Motor: Atrophy present. No tremor.     ED Results / Procedures / Treatments   Labs (all labs ordered are listed, but only abnormal results are displayed) Labs Reviewed  COMPREHENSIVE METABOLIC PANEL  ETHANOL  CBC WITH DIFFERENTIAL/PLATELET    EKG EKG Interpretation  Date/Time:  Thursday December 28 2019 18:55:16 EDT Ventricular Rate:  65 PR Interval:    QRS Duration: 91 QT Interval:  395 QTC Calculation: 411 R Axis:   -5 Text Interpretation: Sinus rhythm ST-t wave abnormality Baseline wander Artifact Abnormal ECG Confirmed by 07[birth year]19[birth year]1992 [phone]) on 12/28/2019 7:32:45 PM   Radiology No results found.  Procedures Procedures (including critical care time)  Medications Ordered in ED Medications  LORazepam (ATIVAN) injection 0.5 mg (has no administration in time range)  sodium chloride 0.9 % bolus 500 mL (has no administration in time range)    ED Course  I have reviewed the triage vital signs and the nursing notes.  Pertinent labs & imaging results that were available during my care of the patient were reviewed by me and considered in my medical decision making (see chart for details).     After initial evaluation reviewed the patient's chart including documentation from neurology visit 2 days ago.  Per chart, the patient has a history of neurocognitive decline, progressive over the last 2 years. He had an MRI performed 1 year ago, no imaging throughout this illness.  With consideration of vertigo versus intracranial lesion versus infection versus electrolyte abnormalities patient had head CT, labs ordered.  For symptom control he received fluids, Ativan.  Initial EKG reassuring.   Update: Patient accompanied by his wife.  She confirms that the patient's episode is  most concerning to her prior to transfer was the patient laying on his back, vomiting, not seemingly making any moves to stop the active process nor to move the vomit from his face. Currently the patient is having difficulty with word formation, she notes that this is  a recent development. We discussed his history of neurocognitive decline recently, she states that this is substantially different today and in the past days.   Given these concerns I discussed this case with our neurology colleagues for additional recommendations for imaging, evaluation. Patient will have MRI, CT, CTA.  11:47 PM I discussed results thus far, evaluation thus far with the patient's daughter who is an interventional radiologist in [place]. We also discussed his recent outpatient neurology clinic note. Daughter notes that the patient has had decline with acute worsening recently.  We discussed pending CT, MR, neuro consult. Shanda Bumps [phone]) would like updates.  This is okay with the patient and his wife as well.   This elderly male with baseline neurocognitive deficits presents with acute episode of dizziness, nausea, vomiting.  This occurs in the context of several recent similar events, and you have difficulty with speech, as well as an episode of unusual behavior where the patient was vomiting on himself. Here the patient is in no distress, is interacting in a manner minimizing his symptoms, but has clear evidence for neurocognitive dysfunction.  Initial CT does not demonstrate mass, no hemorrhage, but with need for further evaluation, patient has MR, CTA pending, neuro consult pending.  Initial other labs reassuring, x-ray does suggest possible aspiration pneumonia, which may be contributing to symptoms, though this occurred after he demonstrated abnormalities earlier in the day.  However, given his combination of aspiration pneumonia, worsening mental status patient will be admitted for internal medicine  evaluation, with neuro consult.  Final Clinical Impression(s) / ED Diagnoses Final diagnoses:  Aspiration pneumonia of right lower lobe due to gastric secretions (HCC)  Bilious vomiting with nausea  Confusion     Gerhard Munch, MD 12/29/19 0001

## 2019-12-28 NOTE — ED Triage Notes (Signed)
Patient arrived via EMS; concern for N/V started today along w/ vertigo. EMS endorsed had similar episodes last week as well. Pt endorsed generalized tiredness as well. EMS endorsed meclizine did not help symptoms. Zofran 4 mg IV given en route.

## 2019-12-29 ENCOUNTER — Emergency Department (HOSPITAL_COMMUNITY): Payer: Medicare PPO

## 2019-12-29 ENCOUNTER — Observation Stay (HOSPITAL_COMMUNITY): Payer: Medicare PPO

## 2019-12-29 DIAGNOSIS — R634 Abnormal weight loss: Secondary | ICD-10-CM | POA: Diagnosis present

## 2019-12-29 DIAGNOSIS — J690 Pneumonitis due to inhalation of food and vomit: Secondary | ICD-10-CM | POA: Diagnosis not present

## 2019-12-29 DIAGNOSIS — Z20822 Contact with and (suspected) exposure to covid-19: Secondary | ICD-10-CM | POA: Diagnosis present

## 2019-12-29 DIAGNOSIS — Z823 Family history of stroke: Secondary | ICD-10-CM | POA: Diagnosis not present

## 2019-12-29 DIAGNOSIS — R590 Localized enlarged lymph nodes: Secondary | ICD-10-CM | POA: Diagnosis present

## 2019-12-29 DIAGNOSIS — R1114 Bilious vomiting: Secondary | ICD-10-CM | POA: Diagnosis present

## 2019-12-29 DIAGNOSIS — J219 Acute bronchiolitis, unspecified: Secondary | ICD-10-CM | POA: Diagnosis present

## 2019-12-29 DIAGNOSIS — J189 Pneumonia, unspecified organism: Secondary | ICD-10-CM | POA: Diagnosis present

## 2019-12-29 DIAGNOSIS — E782 Mixed hyperlipidemia: Secondary | ICD-10-CM | POA: Diagnosis present

## 2019-12-29 DIAGNOSIS — G9341 Metabolic encephalopathy: Secondary | ICD-10-CM | POA: Diagnosis present

## 2019-12-29 DIAGNOSIS — Z87891 Personal history of nicotine dependence: Secondary | ICD-10-CM | POA: Diagnosis not present

## 2019-12-29 DIAGNOSIS — I2510 Atherosclerotic heart disease of native coronary artery without angina pectoris: Secondary | ICD-10-CM

## 2019-12-29 DIAGNOSIS — F0150 Vascular dementia without behavioral disturbance: Secondary | ICD-10-CM | POA: Diagnosis present

## 2019-12-29 DIAGNOSIS — Z6823 Body mass index (BMI) 23.0-23.9, adult: Secondary | ICD-10-CM | POA: Diagnosis not present

## 2019-12-29 DIAGNOSIS — I10 Essential (primary) hypertension: Secondary | ICD-10-CM | POA: Diagnosis present

## 2019-12-29 DIAGNOSIS — R410 Disorientation, unspecified: Secondary | ICD-10-CM | POA: Diagnosis present

## 2019-12-29 DIAGNOSIS — Z951 Presence of aortocoronary bypass graft: Secondary | ICD-10-CM | POA: Diagnosis not present

## 2019-12-29 DIAGNOSIS — G4733 Obstructive sleep apnea (adult) (pediatric): Secondary | ICD-10-CM | POA: Diagnosis present

## 2019-12-29 DIAGNOSIS — E860 Dehydration: Secondary | ICD-10-CM | POA: Diagnosis present

## 2019-12-29 LAB — COMPREHENSIVE METABOLIC PANEL
ALT: 18 U/L (ref 0–44)
AST: 16 U/L (ref 15–41)
Albumin: 3.2 g/dL — ABNORMAL LOW (ref 3.5–5.0)
Alkaline Phosphatase: 45 U/L (ref 38–126)
Anion gap: 8 (ref 5–15)
BUN: 17 mg/dL (ref 8–23)
CO2: 25 mmol/L (ref 22–32)
Calcium: 8.8 mg/dL — ABNORMAL LOW (ref 8.9–10.3)
Chloride: 105 mmol/L (ref 98–111)
Creatinine, Ser: 1.05 mg/dL (ref 0.61–1.24)
GFR calc Af Amer: >60 mL/min (ref >60)
GFR calc non Af Amer: >60 mL/min (ref >60)
Glucose, Bld: 114 mg/dL — ABNORMAL HIGH (ref 70–99)
Potassium: 3.9 mmol/L (ref 3.5–5.1)
Sodium: 138 mmol/L (ref 135–145)
Total Bilirubin: 2.4 mg/dL — ABNORMAL HIGH (ref 0.3–1.2)
Total Protein: 5.4 g/dL — ABNORMAL LOW (ref 6.5–8.1)

## 2019-12-29 LAB — HEPATIC FUNCTION PANEL
ALT: 18 U/L (ref 0–44)
AST: 18 U/L (ref 15–41)
Albumin: 3.7 g/dL (ref 3.5–5.0)
Alkaline Phosphatase: 52 U/L (ref 38–126)
Bilirubin, Direct: 0.3 mg/dL — ABNORMAL HIGH (ref 0.0–0.2)
Indirect Bilirubin: 2.4 mg/dL — ABNORMAL HIGH (ref 0.3–0.9)
Total Bilirubin: 2.7 mg/dL — ABNORMAL HIGH (ref 0.3–1.2)
Total Protein: 5.9 g/dL — ABNORMAL LOW (ref 6.5–8.1)

## 2019-12-29 LAB — FOLATE: Folate: 15 ng/mL (ref >5.9)

## 2019-12-29 LAB — CBC WITH DIFFERENTIAL/PLATELET
Abs Immature Granulocytes: 0.06 10*3/uL (ref 0.00–0.07)
Basophils Absolute: 0.1 10*3/uL (ref 0.0–0.1)
Basophils Relative: 0 %
Eosinophils Absolute: 0 10*3/uL (ref 0.0–0.5)
Eosinophils Relative: 0 %
HCT: 40 % (ref 39.0–52.0)
Hemoglobin: 13.8 g/dL (ref 13.0–17.0)
Immature Granulocytes: 0 %
Lymphocytes Relative: 10 %
Lymphs Abs: 1.4 10*3/uL (ref 0.7–4.0)
MCH: 31.1 pg (ref 26.0–34.0)
MCHC: 34.5 g/dL (ref 30.0–36.0)
MCV: 90.1 fL (ref 80.0–100.0)
Monocytes Absolute: 0.8 10*3/uL (ref 0.1–1.0)
Monocytes Relative: 6 %
Neutro Abs: 12 10*3/uL — ABNORMAL HIGH (ref 1.7–7.7)
Neutrophils Relative %: 84 %
Platelets: 212 10*3/uL (ref 150–400)
RBC: 4.44 MIL/uL (ref 4.22–5.81)
RDW: 12.7 % (ref 11.5–15.5)
WBC: 14.3 10*3/uL — ABNORMAL HIGH (ref 4.0–10.5)
nRBC: 0 % (ref 0.0–0.2)

## 2019-12-29 LAB — URINALYSIS, COMPLETE (UACMP) WITH MICROSCOPIC
Bacteria, UA: NONE SEEN
Bilirubin Urine: NEGATIVE
Glucose, UA: NEGATIVE mg/dL
Hgb urine dipstick: NEGATIVE
Ketones, ur: 20 mg/dL — AB
Leukocytes,Ua: NEGATIVE
Nitrite: NEGATIVE
Protein, ur: NEGATIVE mg/dL
Specific Gravity, Urine: 1.023 (ref 1.005–1.030)
pH: 5 (ref 5.0–8.0)

## 2019-12-29 LAB — VITAMIN B12: Vitamin B-12: 401 pg/mL (ref 180–914)

## 2019-12-29 LAB — TSH: TSH: 0.578 u[IU]/mL (ref 0.350–4.500)

## 2019-12-29 LAB — HIV ANTIBODY (ROUTINE TESTING W REFLEX): HIV Screen 4th Generation wRfx: NONREACTIVE

## 2019-12-29 LAB — LACTIC ACID, PLASMA: Lactic Acid, Venous: 1.2 mmol/L (ref 0.5–1.9)

## 2019-12-29 LAB — SARS CORONAVIRUS 2 BY RT PCR (HOSPITAL ORDER, PERFORMED IN ~~LOC~~ HOSPITAL LAB): SARS Coronavirus 2: NEGATIVE

## 2019-12-29 MED ORDER — SODIUM CHLORIDE 0.9 % IV SOLN
500.0000 mg | INTRAVENOUS | Status: DC
  Administered 2019-12-30: 500 mg via INTRAVENOUS
  Filled 2019-12-29 (×2): qty 500

## 2019-12-29 MED ORDER — MECLIZINE HCL 12.5 MG PO TABS
12.5000 mg | ORAL_TABLET | Freq: Three times a day (TID) | ORAL | Status: DC
  Administered 2019-12-29 (×2): 12.5 mg via ORAL
  Filled 2019-12-29 (×4): qty 1

## 2019-12-29 MED ORDER — RIVASTIGMINE TARTRATE 1.5 MG PO CAPS
6.0000 mg | ORAL_CAPSULE | Freq: Two times a day (BID) | ORAL | Status: DC
  Administered 2019-12-29 (×2): 6 mg via ORAL
  Filled 2019-12-29 (×5): qty 4

## 2019-12-29 MED ORDER — ASPIRIN 81 MG PO CHEW
81.0000 mg | CHEWABLE_TABLET | Freq: Every day | ORAL | Status: DC
  Administered 2019-12-29 – 2019-12-30 (×2): 81 mg via ORAL
  Filled 2019-12-29 (×3): qty 1

## 2019-12-29 MED ORDER — FERROUS SULFATE 325 (65 FE) MG PO TABS
325.0000 mg | ORAL_TABLET | Freq: Every day | ORAL | Status: DC
  Administered 2019-12-29 – 2019-12-30 (×2): 325 mg via ORAL
  Filled 2019-12-29 (×3): qty 1

## 2019-12-29 MED ORDER — IOHEXOL 350 MG/ML SOLN
100.0000 mL | Freq: Once | INTRAVENOUS | Status: AC | PRN
  Administered 2019-12-29: 100 mL via INTRAVENOUS

## 2019-12-29 MED ORDER — ENOXAPARIN SODIUM 40 MG/0.4ML ~~LOC~~ SOLN
40.0000 mg | SUBCUTANEOUS | Status: DC
  Administered 2019-12-29: 40 mg via SUBCUTANEOUS
  Filled 2019-12-29 (×2): qty 0.4

## 2019-12-29 MED ORDER — MELATONIN 3 MG PO TABS
3.0000 mg | ORAL_TABLET | Freq: Every day | ORAL | Status: DC
  Administered 2019-12-29: 3 mg via ORAL
  Filled 2019-12-29: qty 1

## 2019-12-29 MED ORDER — LISINOPRIL 20 MG PO TABS
20.0000 mg | ORAL_TABLET | Freq: Every day | ORAL | Status: DC
  Administered 2019-12-29 – 2019-12-30 (×2): 20 mg via ORAL
  Filled 2019-12-29 (×3): qty 1

## 2019-12-29 MED ORDER — ALPRAZOLAM 0.25 MG PO TABS: 0.2500 mg | ORAL_TABLET | Freq: Every evening | ORAL | Status: DC | PRN

## 2019-12-29 MED ORDER — KETOROLAC TROMETHAMINE 30 MG/ML IJ SOLN
30.0000 mg | Freq: Once | INTRAMUSCULAR | Status: AC
  Administered 2019-12-29: 30 mg via INTRAVENOUS
  Filled 2019-12-29: qty 1

## 2019-12-29 MED ORDER — ACETAMINOPHEN 325 MG PO TABS
650.0000 mg | ORAL_TABLET | Freq: Four times a day (QID) | ORAL | Status: DC | PRN
  Administered 2019-12-29: 650 mg via ORAL
  Filled 2019-12-29: qty 2

## 2019-12-29 MED ORDER — SODIUM CHLORIDE 0.9 % IV SOLN
2.0000 g | INTRAVENOUS | Status: DC
  Administered 2019-12-29: 2 g via INTRAVENOUS
  Filled 2019-12-29: qty 2
  Filled 2019-12-29: qty 20

## 2019-12-29 MED ORDER — IOHEXOL 300 MG/ML  SOLN
100.0000 mL | Freq: Once | INTRAMUSCULAR | Status: AC | PRN
  Administered 2019-12-29: 100 mL via INTRAVENOUS

## 2019-12-29 MED ORDER — SODIUM CHLORIDE 0.9 % IV SOLN: INTRAVENOUS | Status: AC

## 2019-12-29 MED ORDER — ONDANSETRON HCL 4 MG/2ML IJ SOLN
4.0000 mg | Freq: Four times a day (QID) | INTRAMUSCULAR | Status: DC | PRN
  Administered 2019-12-29: 4 mg via INTRAVENOUS
  Filled 2019-12-29: qty 2

## 2019-12-29 MED ORDER — IOHEXOL 9 MG/ML PO SOLN
ORAL | Status: AC
  Filled 2019-12-29: qty 1000

## 2019-12-29 MED ORDER — ROSUVASTATIN CALCIUM 20 MG PO TABS
40.0000 mg | ORAL_TABLET | Freq: Every day | ORAL | Status: DC
  Administered 2019-12-29: 40 mg via ORAL
  Filled 2019-12-29: qty 2

## 2019-12-29 MED ORDER — IOHEXOL 9 MG/ML PO SOLN: 500.0000 mL | ORAL | Status: AC

## 2019-12-29 MED ORDER — FLUTICASONE PROPIONATE 50 MCG/ACT NA SUSP
1.0000 | Freq: Every day | NASAL | Status: DC
  Administered 2019-12-29: 1 via NASAL
  Filled 2019-12-29: qty 16

## 2019-12-29 MED ORDER — CARVEDILOL 3.125 MG PO TABS
3.1250 mg | ORAL_TABLET | Freq: Every day | ORAL | Status: DC
  Administered 2019-12-29 – 2019-12-30 (×2): 3.125 mg via ORAL
  Filled 2019-12-29 (×3): qty 1

## 2019-12-29 MED ORDER — VITAMIN B-12 1000 MCG PO TABS
1000.0000 ug | ORAL_TABLET | Freq: Every day | ORAL | Status: DC
  Administered 2019-12-29 – 2019-12-30 (×2): 1000 ug via ORAL
  Filled 2019-12-29 (×3): qty 1

## 2019-12-29 MED ORDER — SODIUM CHLORIDE 0.9 % IV SOLN
2.0000 g | Freq: Once | INTRAVENOUS | Status: AC
  Administered 2019-12-29: 2 g via INTRAVENOUS
  Filled 2019-12-29: qty 20

## 2019-12-29 NOTE — Consult Note (Signed)
Requesting Physician: Dr. Sharl MaLama    Chief Complaint: Vertigo,vomiting confusion  History obtained from: Patient and Chart    HPI:                                                                                                                                       Omar Kennedy is a 74 y.o. male with past medical history of mild cognitive decline, hypertension, hyperlipidemia, obstructive sleep apnea, coronary disease status post CABG, remote history of vertigo presents on 6/10 evening the emergency department [facility] after abrupt onset episode of vertigo associated with nausea.   History obtained by the patient and the daughter.  Patient states that he had abrupt onset vertigo, nausea and vomiting earlier in the day.    Patient has been having similar symptoms for 2 weeks-had 2 episodes, first episode was after he ate something and a second episode was after being outside in the hot sun.  He describes his vertigo as the room spinning.  States that this is associated with a headache afterwards.  After most recent episode, his wife gave him meclizine and then came back to check on him and he was confused and lying on his back gurgling his vomit.  Patient remembers this episode.   X-ray done in the ED suggestive of possible aspiration pneumonia. Due to confusion and concern for aspiration pneumonia patient was admitted.  Neurology was called to discuss imaging recommendations and MRI brain and CTA head were performed.  CTA did not show any posterior circulation stenosis/dissection in both vertebral arteries and basilar artery were patent.  MRI brain demonstrated ventriculomegaly which is stable from prior MRI and no acute infarcts.  On admission, patient states he had another episode where he had profuse vomiting at this time not associated with dizziness.  He feels like he is back to his.  Patient has also been having progressive cognitive decline.  Has been going on for 2 to 3 years.  [facility] and started on Aricept, but patient had diarrhea.  Currently on rivastigmine.  Is being followed by Dr. Karel JarvisAquino, who performed tele consult few days ago and increased Rivastigmine dose. Blind [place] 8/30.     Past Medical History:  Diagnosis Date  . Arthritis   . Coronary artery disease involving native coronary artery of native heart 05/27/2017  . Dehydration   . Deviated septum   . HTN (hypertension) 08/12/2017  . Hypercholesteremia   . Hypercholesterolemia 12/10/2015  . Hypertension   . 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/2015  . Nasal turbinate hypertrophy   . Obstructive sleep apnea 06/2018   Not on CPAP until after sinus surgery; positional therapy not helpful  . 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 Vertigo     Past Surgical History:  Procedure Laterality Date  . CARDIAC SURGERY    .  CORONARY ARTERY BYPASS GRAFT  2018  . ENDOSCOPIC CONCHA BULLOSA RESECTION Bilateral 08/02/2018   Procedure: ENDOSCOPIC BILATERAL CONCHA BULLOSA RESECTION;  Surgeon: Newman Pies, MD;  Location: [facility];  Service: ENT;  Laterality: Bilateral;  . ETHMOIDECTOMY Bilateral 08/02/2018   Procedure: TOTAL ETHMOIDECTOMY AND SPHENOIDECTOMY;  Surgeon: Newman Pies, MD;  Location: [facility];  Service: ENT;  Laterality: Bilateral;  . EXCISION MORTON'S NEUROMA Left   . FRONTAL SINUS EXPLORATION Bilateral 08/02/2018   Procedure: ENDOSCOPIC BILATERAL FRONTAL RECESS EXPLORATION;  Surgeon: Newman Pies, MD;  Location: [facility];  Service: ENT;  Laterality: Bilateral;  . HERNIA REPAIR    . MAXILLARY ANTROSTOMY Bilateral 08/02/2018   Procedure: ENDOSCOPIC BILATERAL MAXILLARY ANTROSTOMY WITH TISSUE REMOVAL;  Surgeon: Newman Pies, MD;  Location: [facility];  Service: ENT;  Laterality: Bilateral;  . NASAL SEPTOPLASTY W/ TURBINOPLASTY Bilateral 08/02/2018   Procedure: NASAL SEPTOPLASTY WITH BILATERAL TURBINATE REDUCTION;   Surgeon: Newman Pies, MD;  Location: [facility];  Service: ENT;  Laterality: Bilateral;  . SINUS ENDO WITH FUSION Bilateral 08/02/2018   Procedure: SINUS ENDO WITH FUSION;  Surgeon: Newman Pies, MD;  Location: [facility];  Service: ENT;  Laterality: Bilateral;  . umbilical surgery      Family History  Problem Relation Age of Onset  . Stroke Mother    Social History:  reports that he quit smoking about 41 years ago. He has never used smokeless tobacco. He reports current alcohol use of about 7.0 standard drinks of alcohol per week. He reports that he does not use drugs.  Allergies:  Allergies  Allergen Reactions  . Tetracycline Other (See Comments)    VERTIGO  . Amoxicillin-Pot Clavulanate Nausea Only  . Wasp Venom Swelling    Medications:                                                                                                                        I reviewed home medications   ROS:                                                                                                                                     14 systems reviewed and negative except above    Examination:  General: Appears well-developed and well-nourished.  Psych: Affect appropriate to situation Eyes: No scleral injection HENT: No OP obstrucion Head: Normocephalic.  Cardiovascular: Normal rate and regular rhythm.  Respiratory: Effort normal and breath sounds normal to anterior ascultation GI: Soft.  No distension. There is no tenderness.  Skin: WDI    Neurological Examination Mental Status: Alert, oriented into 3 times, thought content appropriate.  Speech fluent with mild expressive aphasia that can be appreciated when having a conversation. Able to follow 3 step commands without difficulty.  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  Cranial Nerves: II: Visual fields grossly normal,  III,IV, VI: ptosis not present, extra-ocular motions intact bilaterally, pupils equal, round, reactive to light and accommodation V,VII: smile symmetric, facial light touch sensation normal bilaterally VIII: hearing normal bilaterally IX,X: uvula rises symmetrically XI: bilateral shoulder shrug XII: midline tongue extension Motor: Right : Upper extremity   5/5    Left:     Upper extremity   5/5  Lower extremity   5/5     Lower extremity   5/5 Tone and bulk:normal tone throughout; no atrophy noted Sensory: Pinprick and light touch intact throughout, bilaterally Deep Tendon Reflexes: 2+ and symmetric throughout Plantars: Right: downgoing   Left: downgoing Cerebellar: normal finger-to-nose, normal rapid alternating movements and normal heel-to-shin test Gait: normal gait and station     Lab Results: Basic Metabolic Panel: Recent Labs  Lab 12/28/19 2057 12/29/19 0650  NA 139 138  K 3.5 3.9  CL 103 105  CO2 25 25  GLUCOSE 121* 114*  BUN 16 17  CREATININE 0.97 1.05  CALCIUM 9.4 8.8*    CBC: Recent Labs  Lab 12/28/19 2057 12/29/19 0650  WBC 11.4* 14.3*  NEUTROABS 10.3* 12.0*  HGB 17.2* 13.8  HCT 50.4 40.0  MCV 90.6 90.1  PLT 245 212    Coagulation Studies: No results for input(s): LABPROT, INR in the last 72 hours.  Imaging: CT Angio Head W or Wo Contrast  Result Date: 12/29/2019 CLINICAL DATA:  Carotid artery stenosis. Nausea and vomiting with vertigo. EXAM: CT ANGIOGRAPHY HEAD AND NECK TECHNIQUE: Multidetector CT imaging of the head and neck was performed using the standard protocol during bolus administration of intravenous contrast. Multiplanar CT image reconstructions and MIPs were obtained to evaluate the vascular anatomy. Carotid stenosis measurements (when applicable) are obtained utilizing NASCET criteria, using the  distal internal carotid diameter as the denominator. CONTRAST:  157mL OMNIPAQUE IOHEXOL 350 MG/ML SOLN COMPARISON:  None. FINDINGS: CTA NECK FINDINGS SKELETON: There is no bony spinal canal stenosis. No lytic or blastic lesion. OTHER NECK: Normal pharynx, larynx and major salivary glands. No cervical lymphadenopathy. Unremarkable thyroid gland. UPPER CHEST: No pneumothorax or pleural effusion. No nodules or masses. AORTIC ARCH: There is mild calcific atherosclerosis of the aortic arch. There is no aneurysm, dissection or hemodynamically significant stenosis of the visualized portion of the aorta. Conventional 3 vessel aortic branching pattern. The visualized proximal subclavian arteries are widely patent. RIGHT CAROTID SYSTEM: No dissection, occlusion or aneurysm. Mild atherosclerotic calcification at the carotid bifurcation without hemodynamically significant stenosis. LEFT CAROTID SYSTEM: Normal without aneurysm, dissection or stenosis. VERTEBRAL ARTERIES: Right dominant configuration. Both origins are clearly patent. There is no dissection, occlusion or flow-limiting stenosis to the skull base (V1-V3 segments). CTA HEAD FINDINGS POSTERIOR CIRCULATION: --Vertebral arteries: Left vertebral artery terminates in PICA. --Inferior cerebellar arteries: Normal. --Basilar artery: Normal. --Superior  cerebellar arteries: Normal. --Posterior cerebral arteries (PCA): Normal. ANTERIOR CIRCULATION: --Intracranial internal carotid arteries: Normal. --Anterior cerebral arteries (ACA): Normal. Both A1 segments are present. Patent anterior communicating artery (a-comm). --Middle cerebral arteries (MCA): Normal. VENOUS SINUSES: As permitted by contrast timing, patent. ANATOMIC VARIANTS: Fetal origin of the left posterior cerebral artery. Right P-comm is also present. Review of the MIP images confirms the above findings. IMPRESSION: 1. No emergent large vessel occlusion or high-grade stenosis of the intracranial arteries. 2. Aortic  Atherosclerosis (ICD10-I70.0). Electronically Signed   By: Deatra Robinson M.D.   On: 12/29/2019 00:42   CT Head Wo Contrast  Result Date: 12/28/2019 CLINICAL DATA:  Ataxia, vertigo EXAM: CT HEAD WITHOUT CONTRAST TECHNIQUE: Contiguous axial images were obtained from the base of the skull through the vertex without intravenous contrast. COMPARISON:  06/02/2019 FINDINGS: Brain: No evidence of acute infarction, hemorrhage, extra-axial collection or mass lesion/mass effect. Stable ventriculomegaly. Scattered low-density changes within the periventricular and subcortical white matter compatible with chronic microvascular ischemic change. Mild diffuse cerebral volume loss. Vascular: Atherosclerotic calcifications involving the large vessels of the skull base. No unexpected hyperdense vessel. Skull: Normal. Negative for fracture or focal lesion. Sinuses/Orbits: Chronic mucosal thickening within the inferior right maxillary sinus. Otherwise negative. Other: None. IMPRESSION: 1. No acute intracranial findings. 2. Stable appearance of moderate ventriculomegaly likely secondary to cerebral volume loss. Normal pressure hydrocephalus remains a consideration, particularly given the clinical history of ataxia. 3. Chronic microvascular ischemic change. Electronically Signed   By: Duanne Guess D.O.   On: 12/28/2019 20:42   CT Angio Neck W and/or Wo Contrast  Result Date: 12/29/2019 CLINICAL DATA:  Carotid artery stenosis. Nausea and vomiting with vertigo. EXAM: CT ANGIOGRAPHY HEAD AND NECK TECHNIQUE: Multidetector CT imaging of the head and neck was performed using the standard protocol during bolus administration of intravenous contrast. Multiplanar CT image reconstructions and MIPs were obtained to evaluate the vascular anatomy. Carotid stenosis measurements (when applicable) are obtained utilizing NASCET criteria, using the distal internal carotid diameter as the denominator. CONTRAST:  OMNIPAQUE IOHEXOL 350 MG/ML  SOLN COMPARISON:  None. FINDINGS: CTA NECK FINDINGS SKELETON: There is no bony spinal canal stenosis. No lytic or blastic lesion. OTHER NECK: Normal pharynx, larynx and major salivary glands. No cervical lymphadenopathy. Unremarkable thyroid gland. UPPER CHEST: No pneumothorax or pleural effusion. No nodules or masses. AORTIC ARCH: There is mild calcific atherosclerosis of the aortic arch. There is no aneurysm, dissection or hemodynamically significant stenosis of the visualized portion of the aorta. Conventional 3 vessel aortic branching pattern. The visualized proximal subclavian arteries are widely patent. RIGHT CAROTID SYSTEM: No dissection, occlusion or aneurysm. Mild atherosclerotic calcification at the carotid bifurcation without hemodynamically significant stenosis. LEFT CAROTID SYSTEM: Normal without aneurysm, dissection or stenosis. VERTEBRAL ARTERIES: Right dominant configuration. Both origins are clearly patent. There is no dissection, occlusion or flow-limiting stenosis to the skull base (V1-V3 segments). CTA HEAD FINDINGS POSTERIOR CIRCULATION: --Vertebral arteries: Left vertebral artery terminates in PICA. --Inferior cerebellar arteries: Normal. --Basilar artery: Normal. --Superior cerebellar arteries: Normal. --Posterior cerebral arteries (PCA): Normal. ANTERIOR CIRCULATION: --Intracranial internal carotid arteries: Normal. --Anterior cerebral arteries (ACA): Normal. Both A1 segments are present. Patent anterior communicating artery (a-comm). --Middle cerebral arteries (MCA): Normal. VENOUS SINUSES: As permitted by contrast timing, patent. ANATOMIC VARIANTS: Fetal origin of the left posterior cerebral artery. Right P-comm is also present. Review of the MIP images confirms the above findings. IMPRESSION: 1. No emergent large vessel occlusion or high-grade stenosis of the intracranial arteries. 2. Aortic  Atherosclerosis (ICD10-I70.0). Electronically Signed   By: Deatra Robinson M.D.   On: 12/29/2019  00:42   CT CHEST W CONTRAST  Result Date: 12/29/2019 CLINICAL DATA:  Hilar fullness. Unexplained weight loss. Question underlying malignancy. EXAM: CT CHEST, ABDOMEN, AND PELVIS WITH CONTRAST TECHNIQUE: Multidetector CT imaging of the chest, abdomen and pelvis was performed following the standard protocol during bolus administration of intravenous contrast. CONTRAST:  OMNIPAQUE IOHEXOL 300 MG/ML  SOLN COMPARISON:  None. FINDINGS: CT CHEST FINDINGS Cardiovascular: Heart size is normal. Pulmonary arteries are at the upper limits of normal. No emboli are evident. Coronary artery calcifications are present. The patient is status post median sternotomy for CABG. Atherosclerotic changes are present at the aortic arch and great vessel origins without aneurysm or stenosis. No significant pericardial effusion is present. Mediastinum/Nodes: No significant mediastinal, hilar, or axillary adenopathy is present. Esophagus is within normal limits. Thoracic inlet is unremarkable. Lungs/Pleura: Dependent atelectasis is present at the lung bases bilaterally. Patchy ground-glass opacifications are present the right middle and upper lobes centrally, most consistent with bronchiolitis. A 10 mm nodule is present the left lung base, the costophrenic sulcus on image 118 of series 5. No other nodules or focal mass lesion is present. Musculoskeletal: Vertebral body heights and alignment are normal. Ribs are unremarkable. Median sternotomy is noted. No focal lytic or blastic lesions are present. CT ABDOMEN PELVIS FINDINGS Hepatobiliary: Liver is somewhat low density suggesting fatty infiltration. No discrete lesions are present. There is focal fatty infiltration along the falciform ligament. Common bile duct and gallbladder are normal. Pancreas: Unremarkable. No pancreatic ductal dilatation or surrounding inflammatory changes. Spleen: Normal in size without focal abnormality. Adrenals/Urinary Tract: 7 mm cyst is present in the  right kidney. No other focal lesions are present. No obstruction is present. Ureters are normal. The urinary bladder is within normal limits. Stomach/Bowel: Stomach and duodenum are within normal limits. Small bowel is unremarkable. Terminal ileum is normal. The ascending and transverse colon are within normal limits. The descending and sigmoid colon are normal. Vascular/Lymphatic: Atherosclerotic changes are noted in the aorta and branch vessels without aneurysm focal stenosis. No significant retroperitoneal adenopathy is present. Reproductive: Right-sided hydrocele is noted. Prostate is mildly enlarged, measuring 4.4 cm in transverse diameter. Other: No significant free fluid or free air is present. No significant ventral hernia is present. Musculoskeletal: Vertebral body heights and alignment are normal. No focal lytic or blastic lesions are present. Moderate facet hypertrophy is noted in the lower lumbar spine. The bony pelvis is normal. The hips are located and within normal limits. IMPRESSION: 1. No acute or focal lesion to explain the patient's weight loss. 2. 10 mm nodule at the left lung base. Consider one of the following in 3 months for both low-risk and high-risk individuals: (a) repeat chest CT, (b) follow-up PET-CT, or (c) tissue sampling. This recommendation follows the consensus statement: Guidelines for Management of Incidental Pulmonary Nodules Detected on CT Images: From the Fleischner Society 2017; Radiology 2017; 284:228-243. 3. Patchy ground-glass opacifications centrally in the right middle and upper lobes most consistent with bronchiolitis. 4. Coronary artery disease. 5. Hepatic steatosis. 6. Right-sided hydrocele. 7. Aortic Atherosclerosis (ICD10-I70.0). Electronically Signed   By: Marin Roberts M.D.   On: 12/29/2019 09:29   MR BRAIN WO CONTRAST  Result Date: 12/29/2019 CLINICAL DATA:  Ataxia. EXAM: MRI HEAD WITHOUT CONTRAST TECHNIQUE: Multiplanar, multiecho pulse sequences of the  brain and surrounding structures were obtained without intravenous contrast. COMPARISON:  06/02/2019 FINDINGS: Brain: There is no  acute infarct, acute hemorrhage or extra-axial collection. Advanced generalized atrophy with unchanged size of the enlarged lateral ventricles. No specific atrophy pattern. There is multifocal periventricular white matter hyperintensity, most often a result of chronic microvascular ischemia. Midline structures are normal. Vascular: Normal flow voids. Skull and upper cervical spine: Normal marrow signal. Sinuses/Orbits: Negative. Other: None. IMPRESSION: 1. No acute intracranial abnormality. 2. Advanced generalized atrophy and findings of chronic microvascular ischemia. Electronically Signed   By: Deatra Robinson M.D.   On: 12/29/2019 02:49   CT ABDOMEN PELVIS W CONTRAST  Result Date: 12/29/2019 CLINICAL DATA:  Hilar fullness. Unexplained weight loss. Question underlying malignancy. EXAM: CT CHEST, ABDOMEN, AND PELVIS WITH CONTRAST TECHNIQUE: Multidetector CT imaging of the chest, abdomen and pelvis was performed following the standard protocol during bolus administration of intravenous contrast. CONTRAST:  OMNIPAQUE IOHEXOL 300 MG/ML  SOLN COMPARISON:  None. FINDINGS: CT CHEST FINDINGS Cardiovascular: Heart size is normal. Pulmonary arteries are at the upper limits of normal. No emboli are evident. Coronary artery calcifications are present. The patient is status post median sternotomy for CABG. Atherosclerotic changes are present at the aortic arch and great vessel origins without aneurysm or stenosis. No significant pericardial effusion is present. Mediastinum/Nodes: No significant mediastinal, hilar, or axillary adenopathy is present. Esophagus is within normal limits. Thoracic inlet is unremarkable. Lungs/Pleura: Dependent atelectasis is present at the lung bases bilaterally. Patchy ground-glass opacifications are present the right middle and upper lobes centrally, most  consistent with bronchiolitis. A 10 mm nodule is present the left lung base, the costophrenic sulcus on image 118 of series 5. No other nodules or focal mass lesion is present. Musculoskeletal: Vertebral body heights and alignment are normal. Ribs are unremarkable. Median sternotomy is noted. No focal lytic or blastic lesions are present. CT ABDOMEN PELVIS FINDINGS Hepatobiliary: Liver is somewhat low density suggesting fatty infiltration. No discrete lesions are present. There is focal fatty infiltration along the falciform ligament. Common bile duct and gallbladder are normal. Pancreas: Unremarkable. No pancreatic ductal dilatation or surrounding inflammatory changes. Spleen: Normal in size without focal abnormality. Adrenals/Urinary Tract: 7 mm cyst is present in the right kidney. No other focal lesions are present. No obstruction is present. Ureters are normal. The urinary bladder is within normal limits. Stomach/Bowel: Stomach and duodenum are within normal limits. Small bowel is unremarkable. Terminal ileum is normal. The ascending and transverse colon are within normal limits. The descending and sigmoid colon are normal. Vascular/Lymphatic: Atherosclerotic changes are noted in the aorta and branch vessels without aneurysm focal stenosis. No significant retroperitoneal adenopathy is present. Reproductive: Right-sided hydrocele is noted. Prostate is mildly enlarged, measuring 4.4 cm in transverse diameter. Other: No significant free fluid or free air is present. No significant ventral hernia is present. Musculoskeletal: Vertebral body heights and alignment are normal. No focal lytic or blastic lesions are present. Moderate facet hypertrophy is noted in the lower lumbar spine. The bony pelvis is normal. The hips are located and within normal limits. IMPRESSION: 1. No acute or focal lesion to explain the patient's weight loss. 2. 10 mm nodule at the left lung base. Consider one of the following in 3 months for  both low-risk and high-risk individuals: (a) repeat chest CT, (b) follow-up PET-CT, or (c) tissue sampling. This recommendation follows the consensus statement: Guidelines for Management of Incidental Pulmonary Nodules Detected on CT Images: From the Fleischner Society 2017; Radiology 2017; 284:228-243. 3. Patchy ground-glass opacifications centrally in the right middle and upper lobes most consistent with bronchiolitis.  4. Coronary artery disease. 5. Hepatic steatosis. 6. Right-sided hydrocele. 7. Aortic Atherosclerosis (ICD10-I70.0). Electronically Signed   By: Marin Roberts M.D.   On: 12/29/2019 09:29   DG Chest Port 1 View  Result Date: 12/28/2019 CLINICAL DATA:  Possible aspiration EXAM: PORTABLE CHEST 1 VIEW COMPARISON:  None. FINDINGS: The heart size is normal. The patient is status post prior median sternotomy. Aortic calcifications are noted. There is fullness of the bilateral hila. There is no pneumothorax. No pleural effusion. There is a hazy right lower lung zone airspace opacity. IMPRESSION: 1. Hazy right lower lung zone airspace opacity which may represent atelectasis or infiltrate. 2. Bilateral hilar and fullness. This can be seen in patients with pulmonary arterial hypertension or lymphadenopathy. A follow-up contrast enhanced CT of the chest is recommended for further evaluation of this finding. Electronically Signed   By: Katherine Mantle M.D.   On: 12/28/2019 23:50     I have reviewed the above imaging : Reviewed CT and MRI brain   ASSESSMENT AND PLAN  74 year old male mild dementia presents with increasing episodes of vertigo and nausea.  Impression:   Acute episodic vertigo D/D peripheral vertigo versus basilar migraine -CTA negative for vertebrobasilar insufficiency to explain recurrent episodes and therefore unlikely TIA.  MRI negative for acute stroke.  No mass lesion seen in the posterior fossa. -Patient's rivastigmine recently increased and this can cause  vertigo, however patient states that this has been going on for 2 weeks and prior to increasing -Recommend PT OT and ENT evaluation -Consider Topamax 25 mg nightly if headaches worsen or episodes continue to persist for possible basilar migraine -Routine EEG but very low suspicion for seizures  Progressive cognitive decline -Defer LP due to low suspicion for NPH ( MRI Arlys Ulysse shows stable ventriculomegaly with no transependymal edema.  While Evan's ratio is close to 0.4 which would favor NPH, this appears to be chronic progressive decline over 2 years, does not have shuffling gait and has urine incontinence only occasional bouts and not continuous dribbling bladder that is expected for NPH.) -TSH, B12 normal -Recommend continued outpatient management and follow-up in memory disorders clinic    Shayna Eblen [facility] Pager Number [phone]

## 2019-12-29 NOTE — Progress Notes (Signed)
New Admission Note:  Arrival Method: Bed Mental Orientation: A&O x4 with intermittent confusion Telemetry: 3W27 Assessment: Completed Skin: Intact IV: Left AC Pain: 0 Safety Measures: Safety Fall Prevention Plan was given, discussed. Admission: Completed 3W: Patient has been orientated to the room, unit and the staff. Family: Dail Lerew @ bedside  Orders have been reviewed and implemented. Will continue to monitor the patient. Call light has been placed within reach and bed alarm has been activated.   Corinna Lines, RN

## 2019-12-29 NOTE — Progress Notes (Signed)
During assessment patient agreed with the medications that were due, however after consideration felt comfortable with the wife being present for this specific medication pass. Medications were already scanned and opened. Medications were then discarded with appropriate disposal and charted as not given in the [facility].

## 2019-12-29 NOTE — Progress Notes (Signed)
Subjective: Patient admitted this morning, see detailed H&P by Dr Leafy Half 74 year old male with history of CAD s/p CABG, hypertension, hyperlipidemia, obstructive sleep apnea, mild vascular neurocognitive disorder for which patient has been followed by [facility] neurology.  Patient said he was having bouts of dizziness associated with nausea and vomiting especially when he would sit in front of the computer.  He denies neck pain or numbness in his arms.  Neurology was consulted.  CT head showed moderate ventriculomegaly, chronic microvascular ischemic changes.  Chest x-ray showed bilateral hilar adenopathy with right lower lobe infiltrate concerning for aspiration pneumonia.  CT chest was obtained which showed bronchiolitis and 10 mm nodule at the left lung base.  MRI brain as well as CTA head and neck obtained showed no acute abnormality.  Vitals:   12/29/19 0315 12/29/19 0733  BP: 121/61 (!) 123/57  Pulse: 90 74  Resp: 18   Temp: 98.3 F (36.8 C) 98 F (36.7 C)  SpO2: 97% 96%      A/P Dizziness-concern for BPPV, will start meclizine 12.5 mg 3 times daily.  Also patient would benefit from vestibular PT. Bronchiolitis/questionable pneumonia-patient started on ceftriaxone and Zithromax.    Omar Kennedy [facility] [phone]

## 2019-12-29 NOTE — Evaluation (Addendum)
Physical Therapy Evaluation Patient Details Name: Omar Kennedy MRN: [id number] DOB: 1945/10/25 Today's Date: 12/29/2019   History of Present Illness  74 year old male with past medical history of coronary artery disease (S/P CABG), hypertension, hyperlipidemia, obstructive sleep apnea as well as diagnosis of "mild vascular neurocognitive disorder" for the past 2 years for which the patient follows with [facility].   Patient is a poor historian due to confusion and likely encephalopathy.  History is been obtained from the patient as well as the wife who is at the bedside. Patient explains that this morning he was experiencing bouts of lightheadedness and nausea.  Clinical Impression  Patient received in bed, RN present. Agrees to PT assessment, wants something to drink. He is independent with bed mobility, but requires assist for lines, safety. He transfers with supervision. Ambulated 200 feet without ad, not overt lob, reports weakness with mobility. He reports no dizziness or nausea throughout session. He may benefit from further vestibular rehab as it sounds like he may have BPPV possibly. He does not require further PT while here, all needs can be met in OPPT.      Follow Up Recommendations Outpatient PT;Other (comment) (Vestibular rehab)    Equipment Recommendations  None recommended by PT    Recommendations for Other Services       Precautions / Restrictions Precautions Precautions: Fall Restrictions Weight Bearing Restrictions: No      Mobility  Bed Mobility Overal bed mobility: Independent                Transfers Overall transfer level: Independent Equipment used: None                Ambulation/Gait Ambulation/Gait assistance: Supervision;Min guard Gait Distance (Feet): 200 Feet Assistive device: None Gait Pattern/deviations: Step-through pattern Gait velocity: WNL   General Gait Details: patient generally steady. Reports he feels weaker than  usual  Stairs            Wheelchair Mobility    Modified Rankin (Stroke Patients Only)       Balance Overall balance assessment: Mild deficits observed, not formally tested                                           Pertinent Vitals/Pain Pain Assessment: 0-10 Pain Score: 2  Faces Pain Scale: Hurts a little bit Pain Location: headache Pain Descriptors / Indicators: Aching Pain Intervention(s): Limited activity within patient's tolerance;Monitored during session    [place] expects to be discharged to:: Private residence Living Arrangements: Spouse/significant other Available Help at Discharge: Family;Available 24 hours/day Type of Home: House Home Access: Stairs to enter   [employer] of Steps: 2 Home Layout: Two level;Able to live on main level with bedroom/bathroom Home Equipment: None      Prior Function Level of Independence: Independent               Hand Dominance        Extremity/Trunk Assessment   Upper Extremity Assessment Upper Extremity Assessment: Generalized weakness    Lower Extremity Assessment Lower Extremity Assessment: Generalized weakness    Cervical / Trunk Assessment Cervical / Trunk Assessment: Normal  Communication   Communication: No difficulties  Cognition Arousal/Alertness: Awake/alert Behavior During Therapy: WFL for tasks assessed/performed Overall Cognitive Status: Within Functional Limits for tasks assessed  General Comments: slightly confused at times with motor planning      General Comments      Exercises     Assessment/Plan    PT Assessment Patent does not need any further PT services;All further PT needs can be met in the next venue of care  PT Problem List Decreased strength;Decreased mobility;Decreased activity tolerance;Decreased balance;Decreased cognition;Decreased safety awareness       PT Treatment  Interventions DME instruction;Therapeutic activities;Gait training;Therapeutic exercise;Stair training;Functional mobility training;Neuromuscular re-education;Balance training;Patient/family education    PT Goals (Current goals can be found in the Care Plan section)  Acute Rehab PT Goals Patient Stated Goal: patient wants something to drink. Hopes to go home today.    Frequency    Barriers to discharge        Co-evaluation               AM-PAC PT "6 Clicks" Mobility  Outcome Measure Help needed turning from your back to your side while in a flat bed without using bedrails?: None Help needed moving from lying on your back to sitting on the side of a flat bed without using bedrails?: None Help needed moving to and from a bed to a chair (including a wheelchair)?: A Little Help needed standing up from a chair using your arms (e.g., wheelchair or bedside chair)?: None Help needed to walk in hospital room?: None Help needed climbing 3-5 steps with a railing? : None 6 Click Score: 23    End of Session Equipment Utilized During Treatment: Gait belt Activity Tolerance: Patient tolerated treatment well Patient left: in chair;with chair alarm set;with call bell/phone within reach Nurse Communication: Mobility status PT Visit Diagnosis: Muscle weakness (generalized) (M62.81)    Time: 8483-5075 PT Time Calculation (min) (ACUTE ONLY): 37 min   Charges:   PT Evaluation $PT Eval Moderate Complexity: 1 Mod PT Treatments $Gait Training: 8-22 mins        Layth Cerezo, PT, GCS 12/29/19,10:17 AM

## 2019-12-29 NOTE — Progress Notes (Signed)
Patient reports nausea and vomiting. MD notified. MD ordered zofran.

## 2019-12-29 NOTE — Evaluation (Signed)
Clinical/Bedside Swallow Evaluation Patient Details  Name: Omar Kennedy MRN: [id number] Date of Birth: 1945/12/16  Today's Date: 12/29/2019 Time: SLP Start Time (ACUTE ONLY): 0935 SLP Stop Time (ACUTE ONLY): 0955 SLP Time Calculation (min) (ACUTE ONLY): 20 min  Past Medical History:  Past Medical History:  Diagnosis Date  . Arthritis   . Coronary artery disease involving native coronary artery of native heart 05/27/2017  . Dehydration   . Deviated septum   . HTN (hypertension) 08/12/2017  . Hypercholesteremia   . Hypercholesterolemia 12/10/2015  . Hypertension   . 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/2015  . Nasal turbinate hypertrophy   . Obstructive sleep apnea 06/2018   Not on CPAP until after sinus surgery; positional therapy not helpful  . 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 Vertigo    Past Surgical History:  Past Surgical History:  Procedure Laterality Date  . CARDIAC SURGERY    . CORONARY ARTERY BYPASS GRAFT  2018  . ENDOSCOPIC CONCHA BULLOSA RESECTION Bilateral 08/02/2018   Procedure: ENDOSCOPIC BILATERAL CONCHA BULLOSA RESECTION;  Surgeon: Newman Pies, MD;  Location: [facility];  Service: ENT;  Laterality: Bilateral;  . ETHMOIDECTOMY Bilateral 08/02/2018   Procedure: TOTAL ETHMOIDECTOMY AND SPHENOIDECTOMY;  Surgeon: Newman Pies, MD;  Location: [facility];  Service: ENT;  Laterality: Bilateral;  . EXCISION MORTON'S NEUROMA Left   . FRONTAL SINUS EXPLORATION Bilateral 08/02/2018   Procedure: ENDOSCOPIC BILATERAL FRONTAL RECESS EXPLORATION;  Surgeon: Newman Pies, MD;  Location: [facility];  Service: ENT;  Laterality: Bilateral;  . HERNIA REPAIR    . MAXILLARY ANTROSTOMY Bilateral 08/02/2018   Procedure: ENDOSCOPIC BILATERAL MAXILLARY ANTROSTOMY WITH TISSUE REMOVAL;  Surgeon: Newman Pies, MD;  Location: [facility];  Service: ENT;  Laterality: Bilateral;  . NASAL SEPTOPLASTY W/  TURBINOPLASTY Bilateral 08/02/2018   Procedure: NASAL SEPTOPLASTY WITH BILATERAL TURBINATE REDUCTION;  Surgeon: Newman Pies, MD;  Location: [facility];  Service: ENT;  Laterality: Bilateral;  . SINUS ENDO WITH FUSION Bilateral 08/02/2018   Procedure: SINUS ENDO WITH FUSION;  Surgeon: Newman Pies, MD;  Location: [facility];  Service: ENT;  Laterality: Bilateral;  . umbilical surgery     HPI:  74yo male admitted 12/28/19 with nausea, confusion, likely encephalopathy. PMH: CAD s/p CABG, HTN, HLD, OSA, mild vascular neurocognitive disorder. CXR = B hilar adenopathy with RLL infiltrate, concerning for aspiration PNA. MRI = no acute abnormality, advanced generalized atrophy, chronic microvascular ischemia   Assessment / Plan / Recommendation Clinical Impression  Pt seen at bedside for assessment of swallow function and safety. CN exam unremarkable. Adequate natural dentition. No history of dysphagia reported. Pt completed oral care after set up, following which pt tolerated trials of thin liquid, puree, and solid textures without obvious oral issues or overt s/s aspiration. Recommend regular diet with thin liquids, meds per pt preference. No further ST intervention recommended at this time. Please reconsult if needs arise.   SLP Visit Diagnosis: Dysphagia, unspecified (R13.10)    Aspiration Risk  Mild aspiration risk    Diet Recommendation Regular;Thin liquid   Liquid Administration via: Cup;Straw Medication Administration: Whole meds with liquid Supervision: Patient able to self feed Compensations: Small sips/bites;Slow rate Postural Changes: Seated upright at 90 degrees    Other  Recommendations Oral Care Recommendations: Oral care BID   Follow up Recommendations None          Prognosis Prognosis for Safe  Diet Advancement: Good      Swallow Study   General Date of Onset: 12/28/19 HPI: 74yo male admitted 12/28/19 with nausea, confusion, likely encephalopathy.  PMH: CAD s/p CABG, HTN, HLD, OSA, mild vascular neurocognitive disorder. CXR = B hilar adenopathy with RLL infiltrate, concerning for aspiration PNA. MRI = no acute abnormality, advanced generalized atrophy, chronic microvascular ischemia Type of Study: Bedside Swallow Evaluation Previous Swallow Assessment: none Diet Prior to this Study: NPO Temperature Spikes Noted: No Respiratory Status: Room air History of Recent Intubation: No Behavior/Cognition: Alert;Cooperative;Pleasant mood Oral Cavity Assessment: Within Functional Limits Oral Care Completed by SLP: Yes Oral Cavity - Dentition: Adequate natural dentition Vision: Functional for self-feeding Self-Feeding Abilities: Able to feed self Patient Positioning: Upright in chair Baseline Vocal Quality: Normal Volitional Cough: Strong Volitional Swallow: Able to elicit    Oral/Motor/Sensory Function Overall Oral Motor/Sensory Function: Within functional limits   Ice Chips Ice chips: Not tested   Thin Liquid Thin Liquid: Within functional limits Presentation: Straw;Cup    Nectar Thick Nectar Thick Liquid: Not tested   Honey Thick Honey Thick Liquid: Not tested   Puree Puree: Within functional limits Presentation: Self Fed;Spoon   Solid     Solid: Within functional limits Presentation: Duncan B. Quentin Ore, [facility], [place] Speech Language Pathologist Office: [phone] Pager: [phone]  Shonna Chock 12/29/2019,9:59 AM

## 2019-12-29 NOTE — TOC Initial Note (Signed)
Transition of Care [facility]) - Initial/Assessment Note    Patient Details  Name: Omar Kennedy MRN: [id number] Date of Birth: Apr 01, 1946  [facility] CM/SW Contact:    Kermit Balo, RN Phone Number: 12/29/2019, 3:44 PM  Clinical Narrative:                 Pt with recommendations for outpatient therapy. CM placed orders for [facility] per pt and wife request.  Pt denies issues with home medications. Wife able to provide needed transportation. TOC following for further d/c needs.   Expected Discharge Plan: OP Rehab Barriers to Discharge: Continued Medical Work up   Patient Goals and CMS Choice     Choice offered to / list presented to : Patient  Expected Discharge Plan and Services Expected Discharge Plan: OP Rehab   Discharge Planning Services: CM Consult   Living arrangements for the past 2 months: Single Family Home                                      Prior Living Arrangements/Services Living arrangements for the past 2 months: Single Family Home Lives with:: Spouse Patient language and need for interpreter reviewed:: Yes Do you feel safe going back to the place where you live?: Yes      Need for Family Participation in Patient Care: Yes (Comment) Care giver support system in place?: Yes (comment)   Criminal Activity/Legal Involvement Pertinent to Current Situation/Hospitalization: No - Comment as needed  Activities of Daily Living      Permission Sought/Granted                  Emotional Assessment Appearance:: Appears stated age Attitude/Demeanor/Rapport: Engaged Affect (typically observed): Accepting Orientation: : Oriented to Self, Oriented to Place, Oriented to Situation   Psych Involvement: No (comment)  Admission diagnosis:  Confusion [R41.0] Bilious vomiting with nausea [R11.14] Aspiration pneumonia of right lower lobe due to gastric secretions (HCC) [J69.0] CAP (community acquired pneumonia) [J18.9] Patient  Active Problem List   Diagnosis Date Noted   Mixed hyperlipidemia 12/29/2019   Hilar adenopathy 12/29/2019   Dehydration 12/29/2019   Unexplained weight loss 12/29/2019   Aspiration pneumonia of right lower lobe due to gastric secretions (HCC) 12/29/2019   Acute metabolic encephalopathy 12/29/2019   CAP (community acquired pneumonia) 12/29/2019   Sleep apnea 09/26/2019   OSA (obstructive sleep apnea) 08/03/2019   Nocturia more than twice per night 08/03/2019   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   Vivid dream 08/03/2019   Mild vascular neurocognitive disorder (HCC) 06/22/2019   Essential hypertension 08/12/2017   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   Coronary artery disease involving native coronary artery of native heart without angina pectoris 05/27/2017   Elevated blood-pressure reading without diagnosis of hypertension 12/10/2015   Hypercholesterolemia 12/10/2015   Memory loss, short term 12/10/2015   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/2015   Nasal polyps 12/10/2015   Poorly-controlled hypertension 12/10/2015   Enlarged prostate 05/20/2011   Insomnia 05/20/2011   PCP:  Marisue Ivan, MD Pharmacy:   [facility] [phone] [place], [place] - [address] [place] [address] Phone: [phone] Fax: [phone]     Social Determinants of Health (SDOH) Interventions    Readmission Risk Interventions No flowsheet data found.

## 2019-12-29 NOTE — ED Notes (Signed)
Admitting MD at bedside at this time.

## 2019-12-29 NOTE — H&P (Signed)
History and Physical    Omar Kennedy ZJQ:[id number] DOB: [birth year] DOA: 12/28/2019  PCP: Marisue Ivan, MD  Patient coming from: Home   Chief Complaint:  Chief Complaint  Patient presents with  . 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    Patient is a poor historian due to confusion and likely encephalopathy.  History is been obtained from the patient as well as the wife who is at the bedside.  Patient explains that this morning he was experiencing bouts of lightheadedness and nausea.  Patient states that he proceeded to walk to the bathroom and urinate with progressively worsening nausea.  Patient then proceeded to go to his bedroom and lay in bed.  His wife gave him a dose of meclizine and left the house for a short period of time.  The wife states that by the time she returned and checked on her husband, he was "lying on his back gurgling his vomit."  Patient states that he recalls lying there on his back actively vomiting and stated "I was too lazy to move."  Wife explains that in the past day the patient has been exhibiting progressively worsening confusion beyond his baseline with much more difficulty with finding words and recall than usual.  There has been no evidence of focal weakness or slurring of speech.  Patient denies any changes in vision.  Patient denies any cough, shortness of breath, fevers or sick contacts.  Patient denies any recent contacts with COVID-19.  Upon evaluation in the emergency department CT head without contrast was performed revealing stable appearance of moderate ventriculomegaly as well as chronic microvascular ischemic changes.  Chest x-ray did reveal bilateral hilar adenopathy with right lower  lobe infiltrate concerning for aspiration pneumonia.  Patient was found to have a mild leukocytosis of 11.4 with evidence of hemoconcentration with hemoglobin of 17.2 and hematocrit of 50.4.  Case was discussed with Dr. Laurence Slate with neurology who recommended CTA of the head and neck as well as MRI brain.  Patient was initiated on intravenous ceftriaxone and azithromycin.  The hospitalist group was then called to assess the patient for admission the hospital.  Upon further questioning, patient does endorse a history of unsteady balance as well as frequent bouts of urinary incontinence over the past several weeks to months.  Review of Systems: Unable to fully perform due to lethargy and confusion.   Past Medical History:  Diagnosis Date  . Arthritis   . Coronary artery disease involving native coronary artery of native heart 05/27/2017  . Dehydration   . Deviated septum   . HTN (hypertension) 08/12/2017  . Hypercholesteremia   . Hypercholesterolemia 12/10/2015  . Hypertension   . 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/2015  . Nasal turbinate hypertrophy   . Obstructive sleep apnea 06/2018   Not on CPAP until after sinus surgery; positional therapy not helpful  . 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 Vertigo     Past Surgical History:  Procedure Laterality Date  . CARDIAC SURGERY    . CORONARY ARTERY BYPASS GRAFT  2018  . ENDOSCOPIC CONCHA BULLOSA RESECTION Bilateral 08/02/2018   Procedure: ENDOSCOPIC BILATERAL CONCHA BULLOSA RESECTION;  Surgeon: Newman Pies, MD;  Location:   SURGERY CENTER;  Service: ENT;  Laterality: Bilateral;  . ETHMOIDECTOMY Bilateral 08/02/2018   Procedure: TOTAL ETHMOIDECTOMY AND SPHENOIDECTOMY;  Surgeon: Newman Pies, MD;  Location: [facility];  Service: ENT;  Laterality: Bilateral;  . EXCISION MORTON'S NEUROMA Left   . FRONTAL SINUS EXPLORATION Bilateral 08/02/2018   Procedure: ENDOSCOPIC BILATERAL FRONTAL  RECESS EXPLORATION;  Surgeon: Newman Pies, MD;  Location: [facility];  Service: ENT;  Laterality: Bilateral;  . HERNIA REPAIR    . MAXILLARY ANTROSTOMY Bilateral 08/02/2018   Procedure: ENDOSCOPIC BILATERAL MAXILLARY ANTROSTOMY WITH TISSUE REMOVAL;  Surgeon: Newman Pies, MD;  Location: [facility];  Service: ENT;  Laterality: Bilateral;  . NASAL SEPTOPLASTY W/ TURBINOPLASTY Bilateral 08/02/2018   Procedure: NASAL SEPTOPLASTY WITH BILATERAL TURBINATE REDUCTION;  Surgeon: Newman Pies, MD;  Location: [facility];  Service: ENT;  Laterality: Bilateral;  . SINUS ENDO WITH FUSION Bilateral 08/02/2018   Procedure: SINUS ENDO WITH FUSION;  Surgeon: Newman Pies, MD;  Location: [facility];  Service: ENT;  Laterality: Bilateral;  . umbilical surgery       reports that he quit smoking about 41 years ago. He has never used smokeless tobacco. He reports current alcohol use of about 7.0 standard drinks of alcohol per week. He reports that he does not use drugs.  Allergies  Allergen Reactions  . Tetracycline Other (See Comments)    VERTIGO  . Amoxicillin-Pot Clavulanate Nausea Only  . Wasp Venom Swelling    Family History  Problem Relation Age of Onset  . Stroke Mother      Prior to Admission medications   Medication Sig Start Date End Date Taking? Authorizing Provider  acetaminophen (TYLENOL) 325 MG tablet Take 650 mg by mouth every 6 (six) hours as needed for mild pain.  06/21/17  Yes [provider]  ALPRAZolam (XANAX) 0.25 MG tablet TAKE 1 TABLET (0.25 MG TOTAL) BY MOUTH AT BEDTIME AS NEEDED FOR ANXIETY. 12/06/19  Yes Dohmeier, Porfirio Mylar, MD  ascorbic acid (VITAMIN C) 500 MG tablet Take by mouth.   Yes [provider]  aspirin 81 MG chewable tablet Chew 81 mg by mouth daily.   Yes [provider]  calcium-vitamin D (OSCAL WITH D) 500-200 MG-UNIT tablet Take 1 tablet by mouth.   Yes [provider]  carvedilol (COREG) 12.5  MG tablet Take 6.25 mg by mouth daily. Verified takes 0.5mg  daily 12/09/17 12/28/19 Yes [provider]  ferrous sulfate 325 (65 FE) MG EC tablet Take 325 mg by mouth daily with breakfast.    Yes [provider]  fluticasone (FLONASE) 50 MCG/ACT nasal spray Place into the nose. 04/02/17  Yes [provider]  lisinopril (ZESTRIL) 20 MG tablet Take 20 mg by mouth daily. 06/29/19  Yes [provider]  Melatonin 10 MG TABS Take by mouth. Takes 3 mg tab at night   Yes [provider]  Multiple Vitamin (MULTIVITAMIN WITH MINERALS) TABS tablet Take 1 tablet by mouth daily.   Yes [provider]  rivastigmine (EXELON) 6 MG capsule Take 1 capsule (6 mg total) by mouth 2 (two) times daily. 12/26/19  Yes Van Clines, MD  rosuvastatin (CRESTOR) 40 MG tablet Take 40 mg by mouth at bedtime.  07/23/17  Yes [provider]  sildenafil (VIAGRA) 100 MG tablet Take 100 mg by mouth daily as needed for erectile dysfunction.  02/07/19  Yes [provider]  vitamin B-12 (CYANOCOBALAMIN) 1000 MCG tablet Take 1,000 mcg by  mouth daily.   Yes [provider]  mirtazapine (REMERON) 15 MG tablet Take 1 tablet (15 mg total) by mouth at bedtime. Patient not taking: Reported on 12/28/2019 12/26/19   Van Clines, MD    Physical Exam: Vitals:   12/28/19 1853  BP: (!) 150/103  Pulse: 67  Resp: 16  Temp: 97.6 F (36.4 C)  TempSrc: Oral  SpO2: 96%  Weight: 74.9 kg  Height:  (1.803 m)    Constitutional: Lethargic but arousable and oriented x3, patient is not in any acute distress. Skin: no rashes, no lesions, slightly poor skin turgor noted. Eyes: Pupils are equally reactive to light.  No evidence of scleral icterus or conjunctival pallor.  ENMT: Dry mucous membranes.  Posterior pharynx clear of any exudate or lesions.   Neck: normal, supple, no masses, no thyromegaly.  No evidence of jugular venous distension.   Respiratory: Right  basilar rales noted, no evidence of wheezing, normal respiratory effort. No accessory muscle use.  Cardiovascular: Regular rate and rhythm, no murmurs / rubs / gallops. No extremity edema. 2+ pedal pulses. No carotid bruits.  Chest:   Nontender without crepitus or deformity.   Back:   Nontender without crepitus or deformity. Abdomen: Abdomen is soft and nontender.  No evidence of intra-abdominal masses.  Positive bowel sounds noted in all quadrants.   Musculoskeletal: No joint deformity upper and lower extremities. Good ROM, no contractures. Normal muscle tone.  Neurologic: CN 2-12 grossly intact. Sensation intact, strength noted to be 5 out of 5 in all 4 extremities.  Patient is following all commands.  Patient is responsive to verbal stimuli.  Patient exhibits an unsteady gait. Psychiatric: Patient exhibits a normal mood with flat affect.  Patient currently does not seem to possess insight as to his current situation.   Labs on Admission: I have personally reviewed following labs and imaging studies -   CBC: Recent Labs  Lab 12/28/19 2057  WBC 11.4*  NEUTROABS 10.3*  HGB 17.2*  HCT 50.4  MCV 90.6  PLT 245   Basic Metabolic Panel: Recent Labs  Lab 12/28/19 2057  NA 139  K 3.5  CL 103  CO2 25  GLUCOSE 121*  BUN 16  CREATININE 0.97  CALCIUM 9.4   GFR: Estimated Creatinine Clearance: 70.8 mL/min (by C-G formula based on SCr of 0.97 mg/dL). Liver Function Tests: Recent Labs  Lab 12/28/19 2057  AST 19  ALT 21  ALKPHOS 61  BILITOT 2.5*  PROT 6.6  ALBUMIN 4.3   No results for input(s): LIPASE, AMYLASE in the last 168 hours. No results for input(s): AMMONIA in the last 168 hours. Coagulation Profile: No results for input(s): INR, PROTIME in the last 168 hours. Cardiac Enzymes: No results for input(s): CKTOTAL, CKMB, CKMBINDEX, TROPONINI in the last 168 hours. BNP (last 3 results) No results for input(s): PROBNP in the last 8760 hours. HbA1C: No results for input(s):  HGBA1C in the last 72 hours. CBG: No results for input(s): GLUCAP in the last 168 hours. Lipid Profile: No results for input(s): CHOL, HDL, LDLCALC, TRIG, CHOLHDL, LDLDIRECT in the last 72 hours. Thyroid Function Tests: No results for input(s): TSH, T4TOTAL, FREET4, T3FREE, THYROIDAB in the last 72 hours. Anemia Panel: No results for input(s): VITAMINB12, FOLATE, FERRITIN, TIBC, IRON, RETICCTPCT in the last 72 hours. Urine analysis:    Component Value Date/Time   COLORURINE YELLOW 12/29/2019 0024   APPEARANCEUR CLEAR 12/29/2019 0024   LABSPEC 1.023 12/29/2019 0024   PHURINE 5.0  12/29/2019 0024   GLUCOSEU NEGATIVE 12/29/2019 0024   HGBUR NEGATIVE 12/29/2019 0024   BILIRUBINUR NEGATIVE 12/29/2019 0024   KETONESUR 20 (A) 12/29/2019 0024   PROTEINUR NEGATIVE 12/29/2019 0024   UROBILINOGEN 0.2 12/02/2009 2303   NITRITE NEGATIVE 12/29/2019 0024   LEUKOCYTESUR NEGATIVE 12/29/2019 0024    Radiological Exams on Admission - Personally Reviewed: CT Angio Head W or Wo Contrast  Result Date: 12/29/2019 CLINICAL DATA:  Carotid artery stenosis. Nausea and vomiting with vertigo. EXAM: CT ANGIOGRAPHY HEAD AND NECK TECHNIQUE: Multidetector CT imaging of the head and neck was performed using the standard protocol during bolus administration of intravenous contrast. Multiplanar CT image reconstructions and MIPs were obtained to evaluate the vascular anatomy. Carotid stenosis measurements (when applicable) are obtained utilizing NASCET criteria, using the distal internal carotid diameter as the denominator. CONTRAST:  188mL OMNIPAQUE IOHEXOL 350 MG/ML SOLN COMPARISON:  None. FINDINGS: CTA NECK FINDINGS SKELETON: There is no bony spinal canal stenosis. No lytic or blastic lesion. OTHER NECK: Normal pharynx, larynx and major salivary glands. No cervical lymphadenopathy. Unremarkable thyroid gland. UPPER CHEST: No pneumothorax or pleural effusion. No nodules or masses. AORTIC ARCH: There is mild calcific  atherosclerosis of the aortic arch. There is no aneurysm, dissection or hemodynamically significant stenosis of the visualized portion of the aorta. Conventional 3 vessel aortic branching pattern. The visualized proximal subclavian arteries are widely patent. RIGHT CAROTID SYSTEM: No dissection, occlusion or aneurysm. Mild atherosclerotic calcification at the carotid bifurcation without hemodynamically significant stenosis. LEFT CAROTID SYSTEM: Normal without aneurysm, dissection or stenosis. VERTEBRAL ARTERIES: Right dominant configuration. Both origins are clearly patent. There is no dissection, occlusion or flow-limiting stenosis to the skull base (V1-V3 segments). CTA HEAD FINDINGS POSTERIOR CIRCULATION: --Vertebral arteries: Left vertebral artery terminates in PICA. --Inferior cerebellar arteries: Normal. --Basilar artery: Normal. --Superior cerebellar arteries: Normal. --Posterior cerebral arteries (PCA): Normal. ANTERIOR CIRCULATION: --Intracranial internal carotid arteries: Normal. --Anterior cerebral arteries (ACA): Normal. Both A1 segments are present. Patent anterior communicating artery (a-comm). --Middle cerebral arteries (MCA): Normal. VENOUS SINUSES: As permitted by contrast timing, patent. ANATOMIC VARIANTS: Fetal origin of the left posterior cerebral artery. Right P-comm is also present. Review of the MIP images confirms the above findings. IMPRESSION: 1. No emergent large vessel occlusion or high-grade stenosis of the intracranial arteries. 2. Aortic Atherosclerosis (ICD10-I70.0). Electronically Signed   By: Ulyses Jarred M.D.   On: 12/29/2019 00:42   CT Head Wo Contrast  Result Date: 12/28/2019 CLINICAL DATA:  Ataxia, vertigo EXAM: CT HEAD WITHOUT CONTRAST TECHNIQUE: Contiguous axial images were obtained from the base of the skull through the vertex without intravenous contrast. COMPARISON:  06/02/2019 FINDINGS: Brain: No evidence of acute infarction, hemorrhage, extra-axial collection or mass  lesion/mass effect. Stable ventriculomegaly. Scattered low-density changes within the periventricular and subcortical white matter compatible with chronic microvascular ischemic change. Mild diffuse cerebral volume loss. Vascular: Atherosclerotic calcifications involving the large vessels of the skull base. No unexpected hyperdense vessel. Skull: Normal. Negative for fracture or focal lesion. Sinuses/Orbits: Chronic mucosal thickening within the inferior right maxillary sinus. Otherwise negative. Other: None. IMPRESSION: 1. No acute intracranial findings. 2. Stable appearance of moderate ventriculomegaly likely secondary to cerebral volume loss. Normal pressure hydrocephalus remains a consideration, particularly given the clinical history of ataxia. 3. Chronic microvascular ischemic change. Electronically Signed   By: Davina Poke D.O.   On: 12/28/2019 20:42   CT Angio Neck W and/or Wo Contrast  Result Date: 12/29/2019 CLINICAL DATA:  Carotid artery stenosis. Nausea and vomiting with vertigo.  EXAM: CT ANGIOGRAPHY HEAD AND NECK TECHNIQUE: Multidetector CT imaging of the head and neck was performed using the standard protocol during bolus administration of intravenous contrast. Multiplanar CT image reconstructions and MIPs were obtained to evaluate the vascular anatomy. Carotid stenosis measurements (when applicable) are obtained utilizing NASCET criteria, using the distal internal carotid diameter as the denominator. CONTRAST:  OMNIPAQUE IOHEXOL 350 MG/ML SOLN COMPARISON:  None. FINDINGS: CTA NECK FINDINGS SKELETON: There is no bony spinal canal stenosis. No lytic or blastic lesion. OTHER NECK: Normal pharynx, larynx and major salivary glands. No cervical lymphadenopathy. Unremarkable thyroid gland. UPPER CHEST: No pneumothorax or pleural effusion. No nodules or masses. AORTIC ARCH: There is mild calcific atherosclerosis of the aortic arch. There is no aneurysm, dissection or hemodynamically significant  stenosis of the visualized portion of the aorta. Conventional 3 vessel aortic branching pattern. The visualized proximal subclavian arteries are widely patent. RIGHT CAROTID SYSTEM: No dissection, occlusion or aneurysm. Mild atherosclerotic calcification at the carotid bifurcation without hemodynamically significant stenosis. LEFT CAROTID SYSTEM: Normal without aneurysm, dissection or stenosis. VERTEBRAL ARTERIES: Right dominant configuration. Both origins are clearly patent. There is no dissection, occlusion or flow-limiting stenosis to the skull base (V1-V3 segments). CTA HEAD FINDINGS POSTERIOR CIRCULATION: --Vertebral arteries: Left vertebral artery terminates in PICA. --Inferior cerebellar arteries: Normal. --Basilar artery: Normal. --Superior cerebellar arteries: Normal. --Posterior cerebral arteries (PCA): Normal. ANTERIOR CIRCULATION: --Intracranial internal carotid arteries: Normal. --Anterior cerebral arteries (ACA): Normal. Both A1 segments are present. Patent anterior communicating artery (a-comm). --Middle cerebral arteries (MCA): Normal. VENOUS SINUSES: As permitted by contrast timing, patent. ANATOMIC VARIANTS: Fetal origin of the left posterior cerebral artery. Right P-comm is also present. Review of the MIP images confirms the above findings. IMPRESSION: 1. No emergent large vessel occlusion or high-grade stenosis of the intracranial arteries. 2. Aortic Atherosclerosis (ICD10-I70.0). Electronically Signed   By: Deatra Robinson M.D.   On: 12/29/2019 00:42   DG Chest Port 1 View  Result Date: 12/28/2019 CLINICAL DATA:  Possible aspiration EXAM: PORTABLE CHEST 1 VIEW COMPARISON:  None. FINDINGS: The heart size is normal. The patient is status post prior median sternotomy. Aortic calcifications are noted. There is fullness of the bilateral hila. There is no pneumothorax. No pleural effusion. There is a hazy right lower lung zone airspace opacity. IMPRESSION: 1. Hazy right lower lung zone airspace  opacity which may represent atelectasis or infiltrate. 2. Bilateral hilar and fullness. This can be seen in patients with pulmonary arterial hypertension or lymphadenopathy. A follow-up contrast enhanced CT of the chest is recommended for further evaluation of this finding. Electronically Signed   By: Katherine Mantle M.D.   On: 12/28/2019 23:50    EKG: Personally reviewed.  Rhythm is normal sinus rhythm with heart rate of 65 bpm.  No dynamic ST segment changes appreciated.  Assessment/Plan Active Problems:   Aspiration pneumonia of right lower lobe due to gastric secretions (HCC)   Evidence of developing right lower lobe infiltrate on chest x-ray  This is after patient was found by the wife to be vomiting while laying down with visible aspiration of vomitus  Patient is already been initiated on intravenous ceftriaxone and azithromycin by the emergency department which will be continued  Hydrating patient with intravenous isotonic fluid  Blood cultures have been ordered    Acute metabolic encephalopathy   Patient seems to be exhibiting lethargy, inattentiveness and slow responses to my questions concerning for a superimposed acute metabolic encephalopathy on top of patient's known neurologic impairments  This  may be caused by a combination of infection and dehydration  Hydrating patient with intravenous isotonic fluids  Treating patient's underlying infection with intravenous azithromycin and ceftriaxone  Obtaining vitamin B12, TSH, folate    Mild neurocognitive disorder   Patient follows with [facility] concerning this as an outpatient  Patient's well-known neurologic deficit seem substantially worse on this presentation  Considering patient's 2-year history of progressively worsening difficulty with word finding and memory the case was discussed with Dr. Laurence SlateAroor with neurology by the ER provider.  CT angiogram of the head and neck as well as MRI brain have been  ordered.  Considering patient's unsteady gait and reports of several bouts of urinary incontinence with evidence of ventriculomegaly on CT imaging normal pressure hydrocephalus must be considered.  Neurology's input concerning this is appreciated.    Coronary artery disease involving native coronary artery of native heart without angina pectoris   Patient is chest pain-free  Continue home regimen of statin and beta-blocker therapy  Monitoring patient on telemetry    Essential hypertension   Continue home regimen of antihypertensive therapy    Hilar adenopathy   Incidental finding of hilar adenopathy on chest x-ray in the setting of unexplained weight loss and vague generalized weakness with neurologic decline.  Obtaining CT imaging of chest abdomen and pelvis for malignancy work-up    Dehydration   Hydrating patient with intravenous isotonic fluids  Monitoring for clinical improvement    Unexplained weight loss   Obtaining CT imaging of chest abdomen and pelvis.    Mixed hyperlipidemia  Continue home regimen of statin therapy    Code Status:  Full code Family Communication: Wife is at bedside and has been updated on plan of care.  I have also had a phone discussion with the daughter and updated her on plan of care.  Status is: Observation  The patient remains OBS appropriate and will d/c before 2 midnights.  Dispo: The patient is from: Home              Anticipated d/c is to: Home              Anticipated d/c date is: 2 days              Patient currently is not medically stable to d/c.        Marinda ElkGeorge J Emili Mcloughlin MD [facility] Pager [phone]- [phone]  If 7PM-7AM, please contact night-coverage [email or web address] Use universal [place] password for that web site. If you do not have the password, please call the hospital operator.  12/29/2019, 2:07 AM

## 2019-12-30 ENCOUNTER — Inpatient Hospital Stay (HOSPITAL_COMMUNITY): Payer: Medicare PPO

## 2019-12-30 DIAGNOSIS — J189 Pneumonia, unspecified organism: Principal | ICD-10-CM

## 2019-12-30 DIAGNOSIS — I10 Essential (primary) hypertension: Secondary | ICD-10-CM

## 2019-12-30 DIAGNOSIS — F0150 Vascular dementia without behavioral disturbance: Secondary | ICD-10-CM

## 2019-12-30 LAB — CBC
HCT: 37.5 % — ABNORMAL LOW (ref 39.0–52.0)
Hemoglobin: 12.7 g/dL — ABNORMAL LOW (ref 13.0–17.0)
MCH: 31.2 pg (ref 26.0–34.0)
MCHC: 33.9 g/dL (ref 30.0–36.0)
MCV: 92.1 fL (ref 80.0–100.0)
Platelets: 170 10*3/uL (ref 150–400)
RBC: 4.07 MIL/uL — ABNORMAL LOW (ref 4.22–5.81)
RDW: 12.8 % (ref 11.5–15.5)
WBC: 8.1 10*3/uL (ref 4.0–10.5)
nRBC: 0 % (ref 0.0–0.2)

## 2019-12-30 MED ORDER — AZITHROMYCIN 500 MG PO TABS
500.0000 mg | ORAL_TABLET | Freq: Every day | ORAL | 0 refills | Status: AC
Start: 2019-12-31 — End: 2020-01-04

## 2019-12-30 MED ORDER — CEFPODOXIME PROXETIL 200 MG PO TABS: 200.0000 mg | ORAL_TABLET | Freq: Two times a day (BID) | ORAL | 0 refills | Status: DC

## 2019-12-30 MED ORDER — LABETALOL HCL 5 MG/ML IV SOLN
10.0000 mg | INTRAVENOUS | Status: DC | PRN
  Administered 2019-12-30: 10 mg via INTRAVENOUS
  Filled 2019-12-30: qty 4

## 2019-12-30 MED ORDER — MECLIZINE HCL 12.5 MG PO TABS: 12.5000 mg | ORAL_TABLET | Freq: Three times a day (TID) | ORAL | 0 refills | Status: DC | PRN

## 2019-12-30 NOTE — Progress Notes (Signed)
EEG complete - results pending 

## 2019-12-30 NOTE — Progress Notes (Signed)
[facility]  PROGRESS NOTE  Omar Kennedy EXB:[id number] DOB: 10-15-1945 DOA: 12/28/2019 PCP: Dion Body, MD   Brief HPI:   74 year old male with history of CAD s/p CABG, hypertension, hyperlipidemia, obstructive sleep apnea, mild vascular neurocognitive disorder for which patient has been followed by [facility] neurology.  Patient said he was having bouts of dizziness associated with nausea and vomiting especially when he would sit in front of the computer.  He denies neck pain or numbness in his arms.  Neurology was consulted.  CT head showed moderate ventriculomegaly, chronic microvascular ischemic changes.  Chest x-ray showed bilateral hilar adenopathy with right lower lobe infiltrate concerning for aspiration pneumonia.  CT chest was obtained which showed bronchiolitis and 10 mm nodule at the left lung base.  MRI brain as well as CTA head and neck obtained showed no acute abnormality.    Subjective   Patient seen and examined, denies any complaints.  No vomiting or dizziness since last night.  Appreciate neurology input.  No further intervention planned except for EEG today.   Assessment/Plan:     1. Dizziness/nausea/vomiting-unclear etiology,?  BPPV versus medication induced.  Although work-up including CT head, MRI brain, CTA head and neck has been negative so far.  Patient started on meclizine 12.5 mg 3 times daily with some improvement.  Might benefit from vestibular PT as outpatient.  Also patient's dose of rivastigmine was increased to 6 mg twice a day 4 days ago.  Discussed with neurology, this medication itself can cause dizziness and vomiting.  Will hold this medication for now and observe. 2. Acute metabolic encephalopathy-resolved, patient is back to baseline.  B12, TSH are normal.  We will continue to monitor. 3. Community-acquired pneumonia-patient CT chest showed bronchiolitis, 10 mm nodule at left lung base, also showed patchy groundglass opacification centrally in the  right middle and upper lobes consistent with bronchiolitis.  Patient started on ceftriaxone and Zithromax for community-acquired pneumonia.  WBC is down to 8.1 today.  Continue current regimen.    Blood cultures negative to date. Follow blood culture results. 4. 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  He has been followed by neurology as outpatient.  He was prescribed rivastigmine and dose of rivastigmine increased recently as above.There was a concern for NPH, LP has been deferred due to low suspicion for NPH.  Will follow up with neurology as outpatient.   5. CAD s/p CABG-no acute issues, continue aspirin, statin, Coreg. 6. Hypertension-blood pressure is controlled, continue antihypertensive regimen including lisinopril, Coreg.    SpO2: 98 %   COVID-19 Labs    Lab Results  Component Value Date   SARSCOV2NAA NEGATIVE 12/29/2019     CBG: No results for input(s): GLUCAP in the last 168 hours.  CBC: Recent Labs  Lab 12/28/19 2057 12/29/19 0650 12/30/19 0354  WBC 11.4* 14.3* 8.1  NEUTROABS 10.3* 12.0*  --   HGB 17.2* 13.8 12.7*  HCT 50.4 40.0 37.5*  MCV 90.6 90.1 92.1  PLT 245 212 102    Basic Metabolic Panel: Recent Labs  Lab 12/28/19 2057 12/29/19 0650  NA 139 138  K 3.5 3.9  CL 103 105  CO2 25 25  GLUCOSE 121* 114*  BUN 16 17  CREATININE 0.97 1.05  CALCIUM 9.4 8.8*     Liver Function Tests: Recent Labs  Lab 12/28/19 2057 12/29/19 0201 12/29/19 0650  AST 19 18 16   ALT 21 18 18   ALKPHOS 61 52 45  BILITOT  2.5* 2.7* 2.4*  PROT 6.6 5.9* 5.4*  ALBUMIN 4.3 3.7 3.2*        DVT prophylaxis: Lovenox  Code Status: Full code  Family Communication: Discussed with patient's wife at bedside on 12/29/2019, called and discussed with patient's daughter on phone.    Status is: Inpatient  Dispo: The patient is from: Home              Anticipated d/c is to: Home              Anticipated d/c date is:  12/31/2019              Patient currently not medically stable for discharge  Barrier to discharge-treatment for dizziness, community-acquired pneumonia        Scheduled medications:  . aspirin  81 mg Oral Daily  . carvedilol  3.125 mg Oral Q breakfast  . enoxaparin (LOVENOX) injection  40 mg Subcutaneous Q24H  . ferrous sulfate  325 mg Oral Q breakfast  . fluticasone  1 spray Each Nare Daily  . lisinopril  20 mg Oral Daily  . meclizine  12.5 mg Oral TID  . melatonin  3 mg Oral QHS  . rosuvastatin  40 mg Oral QHS  . vitamin B-12  1,000 mcg Oral Daily    Consultants:  Neurology  Procedures:  EEG  Antibiotics:   Anti-infectives (From admission, onward)   Start     Dose/Rate Route Frequency Ordered Stop   12/30/19 0200  azithromycin (ZITHROMAX) 500 mg in sodium chloride 0.9 % 250 mL IVPB     Discontinue     500 mg 250 mL/hr over 60 Minutes Intravenous Every 24 hours 12/29/19 0132 01/04/20 0159   12/30/19 0000  cefTRIAXone (ROCEPHIN) 2 g in sodium chloride 0.9 % 100 mL IVPB     Discontinue     2 g 200 mL/hr over 30 Minutes Intravenous Every 24 hours 12/29/19 0132 01/03/20 2359   12/29/19 0145  cefTRIAXone (ROCEPHIN) 2 g in sodium chloride 0.9 % 100 mL IVPB        2 g 200 mL/hr over 30 Minutes Intravenous  Once 12/29/19 0135 12/29/19 0450   12/29/19 0000  cefTRIAXone (ROCEPHIN) 1 g in sodium chloride 0.9 % 100 mL IVPB  Status:  Discontinued        1 g 200 mL/hr over 30 Minutes Intravenous  Once 12/28/19 2359 12/29/19 0135   12/29/19 0000  azithromycin (ZITHROMAX) 500 mg in sodium chloride 0.9 % 250 mL IVPB        500 mg 250 mL/hr over 60 Minutes Intravenous  Once 12/28/19 2359 12/29/19 0626       Objective   Vitals:   12/29/19 2339 12/30/19 0110 12/30/19 0348 12/30/19 0805  BP: (!) 185/87 (!) 138/54 (!) 130/54 (!) 148/63  Pulse: 78 70 61 60  Resp: Temp: 97.9 F (36.6 C) 97.9 F (36.6 C) 97.6 F (36.4 C) 97.9 F (36.6 C)  TempSrc: Oral Oral  Oral Oral  SpO2: 99% 98% 99% 98%  Weight:      Height:        Intake/Output Summary (Last 24 hours) at 12/30/2019 1015 Last data filed at 12/30/2019 0921 Gross per 24 hour  Intake 740 ml  Output 600 ml  Net 140 ml    06/10 1901 - 06/12 0700 In: 590 [P.O.:240] Out: 1875 [Urine:1675]  Filed Weights   12/28/19 1853 12/29/19 0315  Weight: 74.9 kg 74.3 kg  Physical Examination:    General: Appears in no acute distress  Cardiovascular: S1-S2, regular, no murmur auscultated  Respiratory: Clear to auscultation bilaterally, no wheezing or crackles auscultated  Abdomen: Abdomen is soft, nontender, no organomegaly  Extremities: No edema in the lower extremities  Neurologic: Alert, oriented x3, no focal deficit    Data Reviewed:   Recent Results (from the past 240 hour(s))  SARS Coronavirus 2 by RT PCR (hospital order, performed in [facility] hospital lab) Nasopharyngeal     Status: None   Collection Time: 12/29/19 12:02 AM   Specimen: Nasopharyngeal  Result Value Ref Range Status   SARS Coronavirus 2 NEGATIVE NEGATIVE Final    Comment: (NOTE) SARS-CoV-2 target nucleic acids are NOT DETECTED.  The SARS-CoV-2 RNA is generally detectable in upper and lower respiratory specimens during the acute phase of infection. The lowest concentration of SARS-CoV-2 viral copies this assay can detect is 250 copies / mL. A negative result does not preclude SARS-CoV-2 infection and should not be used as the sole basis for treatment or other patient management decisions.  A negative result may occur with improper specimen collection / handling, submission of specimen other than nasopharyngeal swab, presence of viral mutation(s) within the areas targeted by this assay, and inadequate number of viral copies (<250 copies / mL). A negative result must be combined with clinical observations, patient history, and epidemiological information.  Fact Sheet for Patients:    BoilerBrush.com.cy  Fact Sheet for Healthcare Providers: https://pope.com/  This test is not yet approved or  cleared by the [place] FDA and has been authorized for detection and/or diagnosis of SARS-CoV-2 by FDA under an Emergency Use Authorization (EUA).  This EUA will remain in effect (meaning this test can be used) for the duration of the COVID-19 declaration under Section 564(b)(1) of the Act, 21 U.S.C. section 360bbb-3(b)(1), unless the authorization is terminated or revoked sooner.  Performed at [facility], [address] [place], [place] [address]   Culture, blood (routine x 2)     Status: None (Preliminary result)   Collection Time: 12/29/19 12:24 AM   Specimen: BLOOD  Result Value Ref Range Status   Specimen Description BLOOD RIGHT ANTECUBITAL  Final   Special Requests   Final    BOTTLES DRAWN AEROBIC AND ANAEROBIC Blood Culture results may not be optimal due to an excessive volume of blood received in culture bottles   Culture   Final    NO GROWTH 1 DAY Performed at [facility], [address] [place], [place] [address]    Report Status PENDING  Incomplete  Culture, blood (routine x 2)     Status: None (Preliminary result)   Collection Time: 12/29/19 12:29 AM   Specimen: BLOOD  Result Value Ref Range Status   Specimen Description BLOOD RIGHT WRIST  Final   Special Requests   Final    BOTTLES DRAWN AEROBIC AND ANAEROBIC Blood Culture adequate volume   Culture   Final    NO GROWTH 1 DAY Performed at [facility], [address], [place], [place] [address]    Report Status PENDING  Incomplete     Studies:  CT Angio Head W or Wo Contrast  Result Date: 12/29/2019 CLINICAL DATA:  Carotid artery stenosis. Nausea and vomiting with vertigo. EXAM: CT ANGIOGRAPHY HEAD AND NECK TECHNIQUE: Multidetector CT imaging of the head and neck was performed using the standard protocol during bolus  administration of intravenous contrast. Multiplanar CT image  reconstructions and MIPs were obtained to evaluate the vascular anatomy. Carotid stenosis measurements (when applicable) are obtained utilizing NASCET criteria, using the distal internal carotid diameter as the denominator. CONTRAST:  OMNIPAQUE IOHEXOL 350 MG/ML SOLN COMPARISON:  None. FINDINGS: CTA NECK FINDINGS SKELETON: There is no bony spinal canal stenosis. No lytic or blastic lesion. OTHER NECK: Normal pharynx, larynx and major salivary glands. No cervical lymphadenopathy. Unremarkable thyroid gland. UPPER CHEST: No pneumothorax or pleural effusion. No nodules or masses. AORTIC ARCH: There is mild calcific atherosclerosis of the aortic arch. There is no aneurysm, dissection or hemodynamically significant stenosis of the visualized portion of the aorta. Conventional 3 vessel aortic branching pattern. The visualized proximal subclavian arteries are widely patent. RIGHT CAROTID SYSTEM: No dissection, occlusion or aneurysm. Mild atherosclerotic calcification at the carotid bifurcation without hemodynamically significant stenosis. LEFT CAROTID SYSTEM: Normal without aneurysm, dissection or stenosis. VERTEBRAL ARTERIES: Right dominant configuration. Both origins are clearly patent. There is no dissection, occlusion or flow-limiting stenosis to the skull base (V1-V3 segments). CTA HEAD FINDINGS POSTERIOR CIRCULATION: --Vertebral arteries: Left vertebral artery terminates in PICA. --Inferior cerebellar arteries: Normal. --Basilar artery: Normal. --Superior cerebellar arteries: Normal. --Posterior cerebral arteries (PCA): Normal. ANTERIOR CIRCULATION: --Intracranial internal carotid arteries: Normal. --Anterior cerebral arteries (ACA): Normal. Both A1 segments are present. Patent anterior communicating artery (a-comm). --Middle cerebral arteries (MCA): Normal. VENOUS SINUSES: As permitted by contrast timing, patent. ANATOMIC VARIANTS: Fetal origin of  the left posterior cerebral artery. Right P-comm is also present. Review of the MIP images confirms the above findings. IMPRESSION: 1. No emergent large vessel occlusion or high-grade stenosis of the intracranial arteries. 2. Aortic Atherosclerosis (ICD10-I70.0). Electronically Signed   By: Deatra Robinson M.D.   On: 12/29/2019 00:42   CT Head Wo Contrast  Result Date: 12/28/2019 CLINICAL DATA:  Ataxia, vertigo EXAM: CT HEAD WITHOUT CONTRAST TECHNIQUE: Contiguous axial images were obtained from the base of the skull through the vertex without intravenous contrast. COMPARISON:  06/02/2019 FINDINGS: Brain: No evidence of acute infarction, hemorrhage, extra-axial collection or mass lesion/mass effect. Stable ventriculomegaly. Scattered low-density changes within the periventricular and subcortical white matter compatible with chronic microvascular ischemic change. Mild diffuse cerebral volume loss. Vascular: Atherosclerotic calcifications involving the large vessels of the skull base. No unexpected hyperdense vessel. Skull: Normal. Negative for fracture or focal lesion. Sinuses/Orbits: Chronic mucosal thickening within the inferior right maxillary sinus. Otherwise negative. Other: None. IMPRESSION: 1. No acute intracranial findings. 2. Stable appearance of moderate ventriculomegaly likely secondary to cerebral volume loss. Normal pressure hydrocephalus remains a consideration, particularly given the clinical history of ataxia. 3. Chronic microvascular ischemic change. Electronically Signed   By: Duanne Guess D.O.   On: 12/28/2019 20:42   CT Angio Neck W and/or Wo Contrast  Result Date: 12/29/2019 CLINICAL DATA:  Carotid artery stenosis. Nausea and vomiting with vertigo. EXAM: CT ANGIOGRAPHY HEAD AND NECK TECHNIQUE: Multidetector CT imaging of the head and neck was performed using the standard protocol during bolus administration of intravenous contrast. Multiplanar CT image reconstructions and MIPs were  obtained to evaluate the vascular anatomy. Carotid stenosis measurements (when applicable) are obtained utilizing NASCET criteria, using the distal internal carotid diameter as the denominator. CONTRAST:  OMNIPAQUE IOHEXOL 350 MG/ML SOLN COMPARISON:  None. FINDINGS: CTA NECK FINDINGS SKELETON: There is no bony spinal canal stenosis. No lytic or blastic lesion. OTHER NECK: Normal pharynx, larynx and major salivary glands. No cervical lymphadenopathy. Unremarkable thyroid gland. UPPER CHEST: No pneumothorax or pleural effusion. No nodules or masses.  AORTIC ARCH: There is mild calcific atherosclerosis of the aortic arch. There is no aneurysm, dissection or hemodynamically significant stenosis of the visualized portion of the aorta. Conventional 3 vessel aortic branching pattern. The visualized proximal subclavian arteries are widely patent. RIGHT CAROTID SYSTEM: No dissection, occlusion or aneurysm. Mild atherosclerotic calcification at the carotid bifurcation without hemodynamically significant stenosis. LEFT CAROTID SYSTEM: Normal without aneurysm, dissection or stenosis. VERTEBRAL ARTERIES: Right dominant configuration. Both origins are clearly patent. There is no dissection, occlusion or flow-limiting stenosis to the skull base (V1-V3 segments). CTA HEAD FINDINGS POSTERIOR CIRCULATION: --Vertebral arteries: Left vertebral artery terminates in PICA. --Inferior cerebellar arteries: Normal. --Basilar artery: Normal. --Superior cerebellar arteries: Normal. --Posterior cerebral arteries (PCA): Normal. ANTERIOR CIRCULATION: --Intracranial internal carotid arteries: Normal. --Anterior cerebral arteries (ACA): Normal. Both A1 segments are present. Patent anterior communicating artery (a-comm). --Middle cerebral arteries (MCA): Normal. VENOUS SINUSES: As permitted by contrast timing, patent. ANATOMIC VARIANTS: Fetal origin of the left posterior cerebral artery. Right P-comm is also present. Review of the MIP images  confirms the above findings. IMPRESSION: 1. No emergent large vessel occlusion or high-grade stenosis of the intracranial arteries. 2. Aortic Atherosclerosis (ICD10-I70.0). Electronically Signed   By: Deatra Robinson M.D.   On: 12/29/2019 00:42   CT CHEST W CONTRAST  Result Date: 12/29/2019 CLINICAL DATA:  Hilar fullness. Unexplained weight loss. Question underlying malignancy. EXAM: CT CHEST, ABDOMEN, AND PELVIS WITH CONTRAST TECHNIQUE: Multidetector CT imaging of the chest, abdomen and pelvis was performed following the standard protocol during bolus administration of intravenous contrast. CONTRAST:  OMNIPAQUE IOHEXOL 300 MG/ML  SOLN COMPARISON:  None. FINDINGS: CT CHEST FINDINGS Cardiovascular: Heart size is normal. Pulmonary arteries are at the upper limits of normal. No emboli are evident. Coronary artery calcifications are present. The patient is status post median sternotomy for CABG. Atherosclerotic changes are present at the aortic arch and great vessel origins without aneurysm or stenosis. No significant pericardial effusion is present. Mediastinum/Nodes: No significant mediastinal, hilar, or axillary adenopathy is present. Esophagus is within normal limits. Thoracic inlet is unremarkable. Lungs/Pleura: Dependent atelectasis is present at the lung bases bilaterally. Patchy ground-glass opacifications are present the right middle and upper lobes centrally, most consistent with bronchiolitis. A 10 mm nodule is present the left lung base, the costophrenic sulcus on image 118 of series 5. No other nodules or focal mass lesion is present. Musculoskeletal: Vertebral body heights and alignment are normal. Ribs are unremarkable. Median sternotomy is noted. No focal lytic or blastic lesions are present. CT ABDOMEN PELVIS FINDINGS Hepatobiliary: Liver is somewhat low density suggesting fatty infiltration. No discrete lesions are present. There is focal fatty infiltration along the falciform ligament. Common  bile duct and gallbladder are normal. Pancreas: Unremarkable. No pancreatic ductal dilatation or surrounding inflammatory changes. Spleen: Normal in size without focal abnormality. Adrenals/Urinary Tract: 7 mm cyst is present in the right kidney. No other focal lesions are present. No obstruction is present. Ureters are normal. The urinary bladder is within normal limits. Stomach/Bowel: Stomach and duodenum are within normal limits. Small bowel is unremarkable. Terminal ileum is normal. The ascending and transverse colon are within normal limits. The descending and sigmoid colon are normal. Vascular/Lymphatic: Atherosclerotic changes are noted in the aorta and branch vessels without aneurysm focal stenosis. No significant retroperitoneal adenopathy is present. Reproductive: Right-sided hydrocele is noted. Prostate is mildly enlarged, measuring 4.4 cm in transverse diameter. Other: No significant free fluid or free air is present. No significant ventral hernia is present. Musculoskeletal: Vertebral  body heights and alignment are normal. No focal lytic or blastic lesions are present. Moderate facet hypertrophy is noted in the lower lumbar spine. The bony pelvis is normal. The hips are located and within normal limits. IMPRESSION: 1. No acute or focal lesion to explain the patient's weight loss. 2. 10 mm nodule at the left lung base. Consider one of the following in 3 months for both low-risk and high-risk individuals: (a) repeat chest CT, (b) follow-up PET-CT, or (c) tissue sampling. This recommendation follows the consensus statement: Guidelines for Management of Incidental Pulmonary Nodules Detected on CT Images: From the Fleischner Society 2017; Radiology 2017; 284:228-243. 3. Patchy ground-glass opacifications centrally in the right middle and upper lobes most consistent with bronchiolitis. 4. Coronary artery disease. 5. Hepatic steatosis. 6. Right-sided hydrocele. 7. Aortic Atherosclerosis (ICD10-I70.0).  Electronically Signed   By: Marin Robertshristopher  Mattern M.D.   On: 12/29/2019 09:29   MR BRAIN WO CONTRAST  Result Date: 12/29/2019 CLINICAL DATA:  Ataxia. EXAM: MRI HEAD WITHOUT CONTRAST TECHNIQUE: Multiplanar, multiecho pulse sequences of the brain and surrounding structures were obtained without intravenous contrast. COMPARISON:  06/02/2019 FINDINGS: Brain: There is no acute infarct, acute hemorrhage or extra-axial collection. Advanced generalized atrophy with unchanged size of the enlarged lateral ventricles. No specific atrophy pattern. There is multifocal periventricular white matter hyperintensity, most often a result of chronic microvascular ischemia. Midline structures are normal. Vascular: Normal flow voids. Skull and upper cervical spine: Normal marrow signal. Sinuses/Orbits: Negative. Other: None. IMPRESSION: 1. No acute intracranial abnormality. 2. Advanced generalized atrophy and findings of chronic microvascular ischemia. Electronically Signed   By: Deatra RobinsonKevin  Herman M.D.   On: 12/29/2019 02:49   CT ABDOMEN PELVIS W CONTRAST  Result Date: 12/29/2019 CLINICAL DATA:  Hilar fullness. Unexplained weight loss. Question underlying malignancy. EXAM: CT CHEST, ABDOMEN, AND PELVIS WITH CONTRAST TECHNIQUE: Multidetector CT imaging of the chest, abdomen and pelvis was performed following the standard protocol during bolus administration of intravenous contrast. CONTRAST:  100mL OMNIPAQUE IOHEXOL 300 MG/ML  SOLN COMPARISON:  None. FINDINGS: CT CHEST FINDINGS Cardiovascular: Heart size is normal. Pulmonary arteries are at the upper limits of normal. No emboli are evident. Coronary artery calcifications are present. The patient is status post median sternotomy for CABG. Atherosclerotic changes are present at the aortic arch and great vessel origins without aneurysm or stenosis. No significant pericardial effusion is present. Mediastinum/Nodes: No significant mediastinal, hilar, or axillary adenopathy is present.  Esophagus is within normal limits. Thoracic inlet is unremarkable. Lungs/Pleura: Dependent atelectasis is present at the lung bases bilaterally. Patchy ground-glass opacifications are present the right middle and upper lobes centrally, most consistent with bronchiolitis. A 10 mm nodule is present the left lung base, the costophrenic sulcus on image 118 of series 5. No other nodules or focal mass lesion is present. Musculoskeletal: Vertebral body heights and alignment are normal. Ribs are unremarkable. Median sternotomy is noted. No focal lytic or blastic lesions are present. CT ABDOMEN PELVIS FINDINGS Hepatobiliary: Liver is somewhat low density suggesting fatty infiltration. No discrete lesions are present. There is focal fatty infiltration along the falciform ligament. Common bile duct and gallbladder are normal. Pancreas: Unremarkable. No pancreatic ductal dilatation or surrounding inflammatory changes. Spleen: Normal in size without focal abnormality. Adrenals/Urinary Tract: 7 mm cyst is present in the right kidney. No other focal lesions are present. No obstruction is present. Ureters are normal. The urinary bladder is within normal limits. Stomach/Bowel: Stomach and duodenum are within normal limits. Small bowel is unremarkable. Terminal ileum is  normal. The ascending and transverse colon are within normal limits. The descending and sigmoid colon are normal. Vascular/Lymphatic: Atherosclerotic changes are noted in the aorta and branch vessels without aneurysm focal stenosis. No significant retroperitoneal adenopathy is present. Reproductive: Right-sided hydrocele is noted. Prostate is mildly enlarged, measuring 4.4 cm in transverse diameter. Other: No significant free fluid or free air is present. No significant ventral hernia is present. Musculoskeletal: Vertebral body heights and alignment are normal. No focal lytic or blastic lesions are present. Moderate facet hypertrophy is noted in the lower lumbar  spine. The bony pelvis is normal. The hips are located and within normal limits. IMPRESSION: 1. No acute or focal lesion to explain the patient's weight loss. 2. 10 mm nodule at the left lung base. Consider one of the following in 3 months for both low-risk and high-risk individuals: (a) repeat chest CT, (b) follow-up PET-CT, or (c) tissue sampling. This recommendation follows the consensus statement: Guidelines for Management of Incidental Pulmonary Nodules Detected on CT Images: From the Fleischner Society 2017; Radiology 2017; 284:228-243. 3. Patchy ground-glass opacifications centrally in the right middle and upper lobes most consistent with bronchiolitis. 4. Coronary artery disease. 5. Hepatic steatosis. 6. Right-sided hydrocele. 7. Aortic Atherosclerosis (ICD10-I70.0). Electronically Signed   By: Marin Roberts M.D.   On: 12/29/2019 09:29   DG Chest Port 1 View  Result Date: 12/28/2019 CLINICAL DATA:  Possible aspiration EXAM: PORTABLE CHEST 1 VIEW COMPARISON:  None. FINDINGS: The heart size is normal. The patient is status post prior median sternotomy. Aortic calcifications are noted. There is fullness of the bilateral hila. There is no pneumothorax. No pleural effusion. There is a hazy right lower lung zone airspace opacity. IMPRESSION: 1. Hazy right lower lung zone airspace opacity which may represent atelectasis or infiltrate. 2. Bilateral hilar and fullness. This can be seen in patients with pulmonary arterial hypertension or lymphadenopathy. A follow-up contrast enhanced CT of the chest is recommended for further evaluation of this finding. Electronically Signed   By: Katherine Mantle M.D.   On: 12/28/2019 23:50       Milanni Ayub S Casondra Gasca   [facility] If 7PM-7AM, please contact night-coverage at [email or web address], Office  [phone]   12/30/2019, 10:15 AM  LOS: 1 day

## 2019-12-30 NOTE — Plan of Care (Signed)
  Problem: Education: Goal: Knowledge of General Education information will improve Description Including pain rating scale, medication(s)/side effects and non-pharmacologic comfort measures Outcome: Progressing   Problem: Nutrition: Goal: Adequate nutrition will be maintained Outcome: Progressing   Problem: Pain Managment: Goal: General experience of comfort will improve Outcome: Progressing   

## 2019-12-30 NOTE — Discharge Summary (Signed)
Physician Discharge Summary  Omar Kennedy BPZ:[id number] DOB: August 27, 1945 DOA: 12/28/2019  PCP: Marisue Ivan, MD  Admit date: 12/28/2019 Discharge date: 12/30/2019  Time spent: 40* minutes  Recommendations for Outpatient Follow-up:  1. Follow-up neurology in 1 week 2. Hold rivastigmine, check with neurology before starting rivastigmine.   Discharge Diagnoses:  Active Problems:   Coronary artery disease involving native coronary artery of native heart without angina pectoris   Essential hypertension   Mild vascular neurocognitive disorder (HCC)   Mixed hyperlipidemia   Hilar adenopathy   Dehydration   Unexplained weight loss   Aspiration pneumonia of right lower lobe due to gastric secretions (HCC)   Acute metabolic encephalopathy   CAP (community acquired pneumonia)   Discharge Condition: Stable  Diet recommendation: Heart healthy diet  Filed Weights   12/28/19 1853 12/29/19 0315  Weight: 74.9 kg 74.3 kg    History of present illness:  74 year old male with history of CAD s/p CABG, hypertension, hyperlipidemia, obstructive sleep apnea, mild vascular neurocognitive disorder for which patient has been followed by [facility] neurology. Patient said he was having bouts of dizziness associated with nausea and vomiting especially when he would sit in front of the computer. He denies neck pain or numbness in his arms. Neurology was consulted. CT head showed moderate ventriculomegaly, chronic microvascular ischemic changes. Chest x-ray showed bilateral hilar adenopathy with right lower lobe infiltrate concerning for aspiration pneumonia. CT chest was obtained which showed bronchiolitis and 10 mm nodule at the left lung base. MRI brain as well as CTA head and neck obtained showed no acute abnormality  Hospital Course:  1. Dizziness/nausea/vomiting-unclear etiology,?  BPPV versus medication induced.    Significantly improved.  No more nausea and vomiting.  Denies dizziness.  Work-up  including CT head, MRI brain, CTA head and neck has been negative so far.  Patient started on meclizine 12.5 mg 3 times daily with some improvement.  Might benefit from vestibular PT as outpatient.  Outpatient vascular PT referral given. Also patient's dose of rivastigmine was increased to 6 mg twice a day 4 days ago.  Discussed with neurology, this medication itself can cause dizziness and vomiting.  Will hold this medication for now and observe.  Patient wants to go home and does not want to wait for EEG results.  Patient will have EEG today and will discharge him home.  EEG result can be followed up as outpatient.  2. Acute metabolic encephalopathy-resolved, patient is back to baseline.  B12, TSH are normal.   3. Community-acquired neumonia-patient CT chest showed bronchiolitis, 10 mm nodule at left lung base, also showed patchy groundglass opacification centrally in the right middle and upper lobes consistent with bronchiolitis.  Patient started on ceftriaxone and Zithromax for community-acquired pneumonia.  WBC is down to 8.1 today.  Continue current regimen.    Blood cultures negative to date.  Patient will need outpatient CT chest in 3 to 4 weeks to check nodule and also pulmonary evaluation to rule out malignancy.  This was discussed with patient's daughter Danen Lapaglia on phone we will discharge him on Cefpodoxime 200 mg p.o. twice daily for 5 days, Zithromax 500 mg daily for 4 days.  4. 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  He has been followed by neurology as outpatient.  He was prescribed rivastigmine and dose of rivastigmine increased recently as above.There was a concern for NPH, LP has been deferred due to low suspicion for NPH.  Will follow  up with neurology as outpatient.    Rivastigmine is currently on hold.  5. CAD s/p CABG-no acute issues, continue aspirin, statin, Coreg.  6. Hypertension-blood pressure is controlled, continue  antihypertensive regimen including lisinopril, Coreg.      Procedures:  EEG  Consultations:  Neurology  Discharge Exam: Vitals:   12/30/19 0805 12/30/19 1215  BP: (!) 148/63 131/68  Pulse: 60 (!) 55  Resp: 18 16  Temp: 97.9 F (36.6 C) 98 F (36.7 C)  SpO2: 98% 97%     Discharge Instructions   Discharge Instructions    Ambulatory referral to Physical Therapy   Complete by: As directed    Vestibular rehab   Diet - low sodium heart healthy   Complete by: As directed    Increase activity slowly   Complete by: As directed      Allergies as of 12/30/2019      Reactions   Tetracycline Other (See Comments)   VERTIGO   Amoxicillin-pot Clavulanate Nausea Only   Wasp Venom Swelling      Medication List    STOP taking these medications   rivastigmine 6 MG capsule Commonly known as: EXELON     TAKE these medications   acetaminophen 325 MG tablet Commonly known as: TYLENOL Take 650 mg by mouth every 6 (six) hours as needed for mild pain.   ALPRAZolam 0.25 MG tablet Commonly known as: XANAX TAKE 1 TABLET (0.25 MG TOTAL) BY MOUTH AT BEDTIME AS NEEDED FOR ANXIETY.   ascorbic acid 500 MG tablet Commonly known as: VITAMIN C Take by mouth.   aspirin 81 MG chewable tablet Kennedy 81 mg by mouth daily.   azithromycin 500 MG tablet Commonly known as: Zithromax Take 1 tablet (500 mg total) by mouth daily for 4 days. Take 1 tablet daily for 3 days. Start taking on: December 31, 2019   calcium-vitamin D 500-200 MG-UNIT tablet Commonly known as: OSCAL WITH D Take 1 tablet by mouth.   carvedilol 12.5 MG tablet Commonly known as: COREG Take 6.25 mg by mouth daily. Verified takes 0.5mg  daily   cefpodoxime 200 MG tablet Commonly known as: VANTIN Take 1 tablet (200 mg total) by mouth 2 (two) times daily. Start taking on: December 31, 2019   ferrous sulfate 325 (65 FE) MG EC tablet Take 325 mg by mouth daily with breakfast.   fluticasone 50 MCG/ACT nasal  spray Commonly known as: FLONASE Place into the nose.   lisinopril 20 MG tablet Commonly known as: ZESTRIL Take 20 mg by mouth daily.   meclizine 12.5 MG tablet Commonly known as: ANTIVERT Take 1 tablet (12.5 mg total) by mouth 3 (three) times daily as needed for dizziness.   Melatonin 10 MG Tabs Take by mouth. Takes 3 mg tab at night   mirtazapine 15 MG tablet Commonly known as: Remeron Take 1 tablet (15 mg total) by mouth at bedtime.   multivitamin with minerals Tabs tablet Take 1 tablet by mouth daily.   rosuvastatin 40 MG tablet Commonly known as: CRESTOR Take 40 mg by mouth at bedtime.   sildenafil 100 MG tablet Commonly known as: VIAGRA Take 100 mg by mouth daily as needed for erectile dysfunction.   vitamin B-12 1000 MCG tablet Commonly known as: CYANOCOBALAMIN Take 1,000 mcg by mouth daily.      Allergies  Allergen Reactions  . Tetracycline Other (See Comments)    VERTIGO  . Amoxicillin-Pot Clavulanate Nausea Only  . Wasp Venom Swelling    Follow-up Information  [facility] Follow [facility].   Specialty: Rehabilitation Why: The home health agency will contact you for the first home visit. Contact information: [address] [id number] [address] [place] [address] [phone]               The results of significant diagnostics from this hospitalization (including imaging, microbiology, ancillary and laboratory) are listed below for reference.    Significant Diagnostic Studies: CT Angio Head W or Wo Contrast  Result Date: 12/29/2019 CLINICAL DATA:  Carotid artery stenosis. Nausea and vomiting with vertigo. EXAM: CT ANGIOGRAPHY HEAD AND NECK TECHNIQUE: Multidetector CT imaging of the head and neck was performed using the standard protocol during bolus administration of intravenous contrast. Multiplanar CT image reconstructions and MIPs were obtained to evaluate the vascular anatomy. Carotid  stenosis measurements (when applicable) are obtained utilizing NASCET criteria, using the distal internal carotid diameter as the denominator. CONTRAST:  OMNIPAQUE IOHEXOL 350 MG/ML SOLN COMPARISON:  None. FINDINGS: CTA NECK FINDINGS SKELETON: There is no bony spinal canal stenosis. No lytic or blastic lesion. OTHER NECK: Normal pharynx, larynx and major salivary glands. No cervical lymphadenopathy. Unremarkable thyroid gland. UPPER CHEST: No pneumothorax or pleural effusion. No nodules or masses. AORTIC ARCH: There is mild calcific atherosclerosis of the aortic arch. There is no aneurysm, dissection or hemodynamically significant stenosis of the visualized portion of the aorta. Conventional 3 vessel aortic branching pattern. The visualized proximal subclavian arteries are widely patent. RIGHT CAROTID SYSTEM: No dissection, occlusion or aneurysm. Mild atherosclerotic calcification at the carotid bifurcation without hemodynamically significant stenosis. LEFT CAROTID SYSTEM: Normal without aneurysm, dissection or stenosis. VERTEBRAL ARTERIES: Right dominant configuration. Both origins are clearly patent. There is no dissection, occlusion or flow-limiting stenosis to the skull base (V1-V3 segments). CTA HEAD FINDINGS POSTERIOR CIRCULATION: --Vertebral arteries: Left vertebral artery terminates in PICA. --Inferior cerebellar arteries: Normal. --Basilar artery: Normal. --Superior cerebellar arteries: Normal. --Posterior cerebral arteries (PCA): Normal. ANTERIOR CIRCULATION: --Intracranial internal carotid arteries: Normal. --Anterior cerebral arteries (ACA): Normal. Both A1 segments are present. Patent anterior communicating artery (a-comm). --Middle cerebral arteries (MCA): Normal. VENOUS SINUSES: As permitted by contrast timing, patent. ANATOMIC VARIANTS: Fetal origin of the left posterior cerebral artery. Right P-comm is also present. Review of the MIP images confirms the above findings. IMPRESSION: 1. No  emergent large vessel occlusion or high-grade stenosis of the intracranial arteries. 2. Aortic Atherosclerosis (ICD10-I70.0). Electronically Signed   By: Deatra Robinson M.D.   On: 12/29/2019 00:42   CT Head Wo Contrast  Result Date: 12/28/2019 CLINICAL DATA:  Ataxia, vertigo EXAM: CT HEAD WITHOUT CONTRAST TECHNIQUE: Contiguous axial images were obtained from the base of the skull through the vertex without intravenous contrast. COMPARISON:  06/02/2019 FINDINGS: Brain: No evidence of acute infarction, hemorrhage, extra-axial collection or mass lesion/mass effect. Stable ventriculomegaly. Scattered low-density changes within the periventricular and subcortical white matter compatible with chronic microvascular ischemic change. Mild diffuse cerebral volume loss. Vascular: Atherosclerotic calcifications involving the large vessels of the skull base. No unexpected hyperdense vessel. Skull: Normal. Negative for fracture or focal lesion. Sinuses/Orbits: Chronic mucosal thickening within the inferior right maxillary sinus. Otherwise negative. Other: None. IMPRESSION: 1. No acute intracranial findings. 2. Stable appearance of moderate ventriculomegaly likely secondary to cerebral volume loss. Normal pressure hydrocephalus remains a consideration, particularly given the clinical history of ataxia. 3. Chronic microvascular ischemic change. Electronically Signed   By: Duanne Guess D.O.   On: 12/28/2019 20:42   CT Angio Neck W and/or Wo Contrast  Result Date: 12/29/2019 CLINICAL DATA:  Carotid artery stenosis. Nausea and vomiting with vertigo. EXAM: CT ANGIOGRAPHY HEAD AND NECK TECHNIQUE: Multidetector CT imaging of the head and neck was performed using the standard protocol during bolus administration of intravenous contrast. Multiplanar CT image reconstructions and MIPs were obtained to evaluate the vascular anatomy. Carotid stenosis measurements (when applicable) are obtained utilizing NASCET criteria, using the  distal internal carotid diameter as the denominator. CONTRAST:  100mL OMNIPAQUE IOHEXOL 350 MG/ML SOLN COMPARISON:  None. FINDINGS: CTA NECK FINDINGS SKELETON: There is no bony spinal canal stenosis. No lytic or blastic lesion. OTHER NECK: Normal pharynx, larynx and major salivary glands. No cervical lymphadenopathy. Unremarkable thyroid gland. UPPER CHEST: No pneumothorax or pleural effusion. No nodules or masses. AORTIC ARCH: There is mild calcific atherosclerosis of the aortic arch. There is no aneurysm, dissection or hemodynamically significant stenosis of the visualized portion of the aorta. Conventional 3 vessel aortic branching pattern. The visualized proximal subclavian arteries are widely patent. RIGHT CAROTID SYSTEM: No dissection, occlusion or aneurysm. Mild atherosclerotic calcification at the carotid bifurcation without hemodynamically significant stenosis. LEFT CAROTID SYSTEM: Normal without aneurysm, dissection or stenosis. VERTEBRAL ARTERIES: Right dominant configuration. Both origins are clearly patent. There is no dissection, occlusion or flow-limiting stenosis to the skull base (V1-V3 segments). CTA HEAD FINDINGS POSTERIOR CIRCULATION: --Vertebral arteries: Left vertebral artery terminates in PICA. --Inferior cerebellar arteries: Normal. --Basilar artery: Normal. --Superior cerebellar arteries: Normal. --Posterior cerebral arteries (PCA): Normal. ANTERIOR CIRCULATION: --Intracranial internal carotid arteries: Normal. --Anterior cerebral arteries (ACA): Normal. Both A1 segments are present. Patent anterior communicating artery (a-comm). --Middle cerebral arteries (MCA): Normal. VENOUS SINUSES: As permitted by contrast timing, patent. ANATOMIC VARIANTS: Fetal origin of the left posterior cerebral artery. Right P-comm is also present. Review of the MIP images confirms the above findings. IMPRESSION: 1. No emergent large vessel occlusion or high-grade stenosis of the intracranial arteries. 2. Aortic  Atherosclerosis (ICD10-I70.0). Electronically Signed   By: Deatra RobinsonKevin  Herman M.D.   On: 12/29/2019 00:42   CT CHEST W CONTRAST  Result Date: 12/29/2019 CLINICAL DATA:  Hilar fullness. Unexplained weight loss. Question underlying malignancy. EXAM: CT CHEST, ABDOMEN, AND PELVIS WITH CONTRAST TECHNIQUE: Multidetector CT imaging of the chest, abdomen and pelvis was performed following the standard protocol during bolus administration of intravenous contrast. CONTRAST:  100mL OMNIPAQUE IOHEXOL 300 MG/ML  SOLN COMPARISON:  None. FINDINGS: CT CHEST FINDINGS Cardiovascular: Heart size is normal. Pulmonary arteries are at the upper limits of normal. No emboli are evident. Coronary artery calcifications are present. The patient is status post median sternotomy for CABG. Atherosclerotic changes are present at the aortic arch and great vessel origins without aneurysm or stenosis. No significant pericardial effusion is present. Mediastinum/Nodes: No significant mediastinal, hilar, or axillary adenopathy is present. Esophagus is within normal limits. Thoracic inlet is unremarkable. Lungs/Pleura: Dependent atelectasis is present at the lung bases bilaterally. Patchy ground-glass opacifications are present the right middle and upper lobes centrally, most consistent with bronchiolitis. A 10 mm nodule is present the left lung base, the costophrenic sulcus on image 118 of series 5. No other nodules or focal mass lesion is present. Musculoskeletal: Vertebral body heights and alignment are normal. Ribs are unremarkable. Median sternotomy is noted. No focal lytic or blastic lesions are present. CT ABDOMEN PELVIS FINDINGS Hepatobiliary: Liver is somewhat low density suggesting fatty infiltration. No discrete lesions are present. There is focal fatty infiltration along the falciform ligament. Common bile duct and gallbladder are normal. Pancreas: Unremarkable. No pancreatic ductal dilatation or  surrounding inflammatory changes. Spleen:  Normal in size without focal abnormality. Adrenals/Urinary Tract: 7 mm cyst is present in the right kidney. No other focal lesions are present. No obstruction is present. Ureters are normal. The urinary bladder is within normal limits. Stomach/Bowel: Stomach and duodenum are within normal limits. Small bowel is unremarkable. Terminal ileum is normal. The ascending and transverse colon are within normal limits. The descending and sigmoid colon are normal. Vascular/Lymphatic: Atherosclerotic changes are noted in the aorta and branch vessels without aneurysm focal stenosis. No significant retroperitoneal adenopathy is present. Reproductive: Right-sided hydrocele is noted. Prostate is mildly enlarged, measuring 4.4 cm in transverse diameter. Other: No significant free fluid or free air is present. No significant ventral hernia is present. Musculoskeletal: Vertebral body heights and alignment are normal. No focal lytic or blastic lesions are present. Moderate facet hypertrophy is noted in the lower lumbar spine. The bony pelvis is normal. The hips are located and within normal limits. IMPRESSION: 1. No acute or focal lesion to explain the patient's weight loss. 2. 10 mm nodule at the left lung base. Consider one of the following in 3 months for both low-risk and high-risk individuals: (a) repeat chest CT, (b) follow-up PET-CT, or (c) tissue sampling. This recommendation follows the consensus statement: Guidelines for Management of Incidental Pulmonary Nodules Detected on CT Images: From the Fleischner Society 2017; Radiology 2017; 284:228-243. 3. Patchy ground-glass opacifications centrally in the right middle and upper lobes most consistent with bronchiolitis. 4. Coronary artery disease. 5. Hepatic steatosis. 6. Right-sided hydrocele. 7. Aortic Atherosclerosis (ICD10-I70.0). Electronically Signed   By: Marin Roberts M.D.   On: 12/29/2019 09:29   MR BRAIN WO CONTRAST  Result Date: 12/29/2019 CLINICAL DATA:   Ataxia. EXAM: MRI HEAD WITHOUT CONTRAST TECHNIQUE: Multiplanar, multiecho pulse sequences of the brain and surrounding structures were obtained without intravenous contrast. COMPARISON:  06/02/2019 FINDINGS: Brain: There is no acute infarct, acute hemorrhage or extra-axial collection. Advanced generalized atrophy with unchanged size of the enlarged lateral ventricles. No specific atrophy pattern. There is multifocal periventricular white matter hyperintensity, most often a result of chronic microvascular ischemia. Midline structures are normal. Vascular: Normal flow voids. Skull and upper cervical spine: Normal marrow signal. Sinuses/Orbits: Negative. Other: None. IMPRESSION: 1. No acute intracranial abnormality. 2. Advanced generalized atrophy and findings of chronic microvascular ischemia. Electronically Signed   By: Deatra Robinson M.D.   On: 12/29/2019 02:49   CT ABDOMEN PELVIS W CONTRAST  Result Date: 12/29/2019 CLINICAL DATA:  Hilar fullness. Unexplained weight loss. Question underlying malignancy. EXAM: CT CHEST, ABDOMEN, AND PELVIS WITH CONTRAST TECHNIQUE: Multidetector CT imaging of the chest, abdomen and pelvis was performed following the standard protocol during bolus administration of intravenous contrast. CONTRAST:  OMNIPAQUE IOHEXOL 300 MG/ML  SOLN COMPARISON:  None. FINDINGS: CT CHEST FINDINGS Cardiovascular: Heart size is normal. Pulmonary arteries are at the upper limits of normal. No emboli are evident. Coronary artery calcifications are present. The patient is status post median sternotomy for CABG. Atherosclerotic changes are present at the aortic arch and great vessel origins without aneurysm or stenosis. No significant pericardial effusion is present. Mediastinum/Nodes: No significant mediastinal, hilar, or axillary adenopathy is present. Esophagus is within normal limits. Thoracic inlet is unremarkable. Lungs/Pleura: Dependent atelectasis is present at the lung bases bilaterally.  Patchy ground-glass opacifications are present the right middle and upper lobes centrally, most consistent with bronchiolitis. A 10 mm nodule is present the left lung base, the costophrenic sulcus on image 118 of series 5. No  other nodules or focal mass lesion is present. Musculoskeletal: Vertebral body heights and alignment are normal. Ribs are unremarkable. Median sternotomy is noted. No focal lytic or blastic lesions are present. CT ABDOMEN PELVIS FINDINGS Hepatobiliary: Liver is somewhat low density suggesting fatty infiltration. No discrete lesions are present. There is focal fatty infiltration along the falciform ligament. Common bile duct and gallbladder are normal. Pancreas: Unremarkable. No pancreatic ductal dilatation or surrounding inflammatory changes. Spleen: Normal in size without focal abnormality. Adrenals/Urinary Tract: 7 mm cyst is present in the right kidney. No other focal lesions are present. No obstruction is present. Ureters are normal. The urinary bladder is within normal limits. Stomach/Bowel: Stomach and duodenum are within normal limits. Small bowel is unremarkable. Terminal ileum is normal. The ascending and transverse colon are within normal limits. The descending and sigmoid colon are normal. Vascular/Lymphatic: Atherosclerotic changes are noted in the aorta and branch vessels without aneurysm focal stenosis. No significant retroperitoneal adenopathy is present. Reproductive: Right-sided hydrocele is noted. Prostate is mildly enlarged, measuring 4.4 cm in transverse diameter. Other: No significant free fluid or free air is present. No significant ventral hernia is present. Musculoskeletal: Vertebral body heights and alignment are normal. No focal lytic or blastic lesions are present. Moderate facet hypertrophy is noted in the lower lumbar spine. The bony pelvis is normal. The hips are located and within normal limits. IMPRESSION: 1. No acute or focal lesion to explain the patient's  weight loss. 2. 10 mm nodule at the left lung base. Consider one of the following in 3 months for both low-risk and high-risk individuals: (a) repeat chest CT, (b) follow-up PET-CT, or (c) tissue sampling. This recommendation follows the consensus statement: Guidelines for Management of Incidental Pulmonary Nodules Detected on CT Images: From the Fleischner Society 2017; Radiology 2017; 284:228-243. 3. Patchy ground-glass opacifications centrally in the right middle and upper lobes most consistent with bronchiolitis. 4. Coronary artery disease. 5. Hepatic steatosis. 6. Right-sided hydrocele. 7. Aortic Atherosclerosis (ICD10-I70.0). Electronically Signed   By: San Morelle M.D.   On: 12/29/2019 09:29   DG Chest Port 1 View  Result Date: 12/28/2019 CLINICAL DATA:  Possible aspiration EXAM: PORTABLE CHEST 1 VIEW COMPARISON:  None. FINDINGS: The heart size is normal. The patient is status post prior median sternotomy. Aortic calcifications are noted. There is fullness of the bilateral hila. There is no pneumothorax. No pleural effusion. There is a hazy right lower lung zone airspace opacity. IMPRESSION: 1. Hazy right lower lung zone airspace opacity which may represent atelectasis or infiltrate. 2. Bilateral hilar and fullness. This can be seen in patients with pulmonary arterial hypertension or lymphadenopathy. A follow-up contrast enhanced CT of the chest is recommended for further evaluation of this finding. Electronically Signed   By: Constance Holster M.D.   On: 12/28/2019 23:50    Microbiology: Recent Results (from the past 240 hour(s))  SARS Coronavirus 2 by RT PCR (hospital order, performed in [facility] hospital lab) Nasopharyngeal     Status: None   Collection Time: 12/29/19 12:02 AM   Specimen: Nasopharyngeal  Result Value Ref Range Status   SARS Coronavirus 2 NEGATIVE NEGATIVE Final    Comment: (NOTE) SARS-CoV-2 target nucleic acids are NOT DETECTED.  The SARS-CoV-2 RNA is  generally detectable in upper and lower respiratory specimens during the acute phase of infection. The lowest concentration of SARS-CoV-2 viral copies this assay can detect is 250 copies / mL. A negative result does not preclude SARS-CoV-2 infection and should not be used  as the sole basis for treatment or other patient management decisions.  A negative result may occur with improper specimen collection / handling, submission of specimen other than nasopharyngeal swab, presence of viral mutation(s) within the areas targeted by this assay, and inadequate number of viral copies (<250 copies / mL). A negative result must be combined with clinical observations, patient history, and epidemiological information.  Fact Sheet for Patients:   BoilerBrush.com.cy  Fact Sheet for Healthcare Providers: https://pope.com/  This test is not yet approved or  cleared by the [place] FDA and has been authorized for detection and/or diagnosis of SARS-CoV-2 by FDA under an Emergency Use Authorization (EUA).  This EUA will remain in effect (meaning this test can be used) for the duration of the COVID-19 declaration under Section 564(b)(1) of the Act, 21 U.S.C. section 360bbb-3(b)(1), unless the authorization is terminated or revoked sooner.  Performed at [facility], [address], [place], [place] [address]   Culture, blood (routine x 2)     Status: None (Preliminary result)   Collection Time: 12/29/19 12:24 AM   Specimen: BLOOD  Result Value Ref Range Status   Specimen Description BLOOD RIGHT ANTECUBITAL  Final   Special Requests   Final    BOTTLES DRAWN AEROBIC AND ANAEROBIC Blood Culture results may not be optimal due to an excessive volume of blood received in culture bottles   Culture   Final    NO GROWTH 1 DAY Performed at [facility], [address], [place], [place] [address]    Report Status PENDING  Incomplete  Culture,  blood (routine x 2)     Status: None (Preliminary result)   Collection Time: 12/29/19 12:29 AM   Specimen: BLOOD  Result Value Ref Range Status   Specimen Description BLOOD RIGHT WRIST  Final   Special Requests   Final    BOTTLES DRAWN AEROBIC AND ANAEROBIC Blood Culture adequate volume   Culture   Final    NO GROWTH 1 DAY Performed at [facility], [address] [place], [place] [address]    Report Status PENDING  Incomplete     Labs: Basic Metabolic Panel: Recent Labs  Lab 12/28/19 2057 12/29/19 0650  NA 139 138  K 3.5 3.9  CL 103 105  CO2 25 25  GLUCOSE 121* 114*  BUN 16 17  CREATININE 0.97 1.05  CALCIUM 9.4 8.8*   Liver Function Tests: Recent Labs  Lab 12/28/19 2057 12/29/19 0201 12/29/19 0650  AST 19 18 16   ALT 21 18 18   ALKPHOS 61 52 45  BILITOT 2.5* 2.7* 2.4*  PROT 6.6 5.9* 5.4*  ALBUMIN 4.3 3.7 3.2*   No results for input(s): LIPASE, AMYLASE in the last 168 hours. No results for input(s): AMMONIA in the last 168 hours. CBC: Recent Labs  Lab 12/28/19 2057 12/29/19 0650 12/30/19 0354  WBC 11.4* 14.3* 8.1  NEUTROABS 10.3* 12.0*  --   HGB 17.2* 13.8 12.7*  HCT 50.4 40.0 37.5*  MCV 90.6 90.1 92.1  PLT 245 212 170       Signed:  02/28/20 MD.  [facility] 12/30/2019, 2:55 PM

## 2019-12-30 NOTE — Progress Notes (Signed)
Pt.s IV removed, site is clean, dry and intact. Discharge instruction reviewed with patient and wife, stated it was understood. Personal belongings packed by wife. Patient transported via wheelchair by nursing staff to private vehicle driven by wife.

## 2019-12-31 NOTE — Procedures (Signed)
History: 74 year old M being evaluated for encephalopathy and falls  Sedation: None  Technique: This is a 21 channel routine scalp EEG performed at the bedside with bipolar and monopolar montages arranged in accordance to the international 10/20 system of electrode placement. One channel was dedicated to EKG recording.    Background: There is a well-formed posterior dominant rhythm of 8 to 9 Hz which is seen bilaterally and symmetrically with some mild intermixed frontocentrally predominant beta.  There is very mild admixed generalized irregular delta and theta range activities intruding into the background.  Sleep is not recorded.  Photic stimulation: Physiologic driving is present  EEG Abnormalities: 1) mild generalized irregular slow activity  Clinical Interpretation: This EEG is consistent with a mild generalized nonspecific cerebral dysfunction (encephalopathy). There was no seizure or seizure predisposition recorded on this study. Please note that lack of epileptiform activity on EEG does not preclude the possibility of epilepsy.   Ritta Slot, MD [facility] [phone]  If 7pm- 7am, please page neurology on call as listed in [facility]ION.

## 2020-01-03 LAB — CULTURE, BLOOD (ROUTINE X 2)
Culture: NO GROWTH
Culture: NO GROWTH
Special Requests: ADEQUATE

## 2020-01-04 ENCOUNTER — Telehealth: Payer: Self-pay | Admitting: Neurology

## 2020-01-04 MED ORDER — RIVASTIGMINE TARTRATE 1.5 MG PO CAPS: 1.5000 mg | ORAL_CAPSULE | Freq: Two times a day (BID) | ORAL | 11 refills | Status: DC

## 2020-01-04 NOTE — Telephone Encounter (Signed)
Spoke to pt wife informed her that Dr Karel Jarvis reviewed the hospital notes and since the symptoms started after increasing the rivastigmine dose, she think we should go back down to 1.5mg  twice a day that he was tolerating previously without side effects.  Pt wife [name] stated that they will try the 1.5mg  BID  but the pt did have 2 incidents of dizzy and vomiting when on the 1.5mg  before it was increase,  Pt wife advised that I would call her back if any changes would be made.

## 2020-01-04 NOTE — Telephone Encounter (Signed)
Pls let wife know that I reviewed the hospital notes and since the symptoms started after increasing the rivastigmine dose, I think we should go back down to 1.5mg  twice a day that he was tolerating previously without side effects. If she is okay with this, pls change Rx to 1.5mg  BID. thanks

## 2020-01-04 NOTE — Telephone Encounter (Signed)
Patient's wife called to report her husband was admitted to the ED late last week due to "vomiting and aspirating." She said his PCP told her to call to be sure Dr. Karel Jarvis is aware of the incident.   She further explained the patient had increased his Rivastigmine dose after seeing Dr. Karel Jarvis on 12/26/19. Since the incident, the patient has stopped taking the medication per the directions from the ED doctor.  She'd like a call back to talk more about this.

## 2020-01-04 NOTE — Addendum Note (Signed)
Addended by: Van Clines on: 01/04/2020 12:35 PM   Modules accepted: Orders

## 2020-01-17 ENCOUNTER — Other Ambulatory Visit: Payer: Self-pay | Admitting: Neurology

## 2020-01-24 ENCOUNTER — Ambulatory Visit: Payer: Medicare PPO

## 2020-01-31 ENCOUNTER — Ambulatory Visit: Payer: Medicare PPO | Attending: Family Medicine

## 2020-01-31 ENCOUNTER — Other Ambulatory Visit: Payer: Self-pay

## 2020-01-31 DIAGNOSIS — R42 Dizziness and giddiness: Secondary | ICD-10-CM | POA: Diagnosis present

## 2020-01-31 NOTE — Therapy (Addendum)
[place] [facility] MAIN [facility] SERVICES [address] [place], [place], [address] Phone: [phone]   Fax:  [phone]  Physical Therapy Evaluation  Patient Details  Name: Omar Kennedy MRN: [id number] Date of Birth: 03-09-1946 Referring Provider (PT): Dr. Burnadette Pop   Encounter Date: 01/31/2020   PT End of Session - 02/01/20 1300    Visit Number 1    Number of Visits 9    Date for PT Re-Evaluation 03/28/20    Authorization Type eval: 02/01/20    PT Start Time 1125    PT Stop Time 1210    PT Time Calculation (min) 45 min    Activity Tolerance Patient tolerated treatment well    Behavior During Therapy [facility] for tasks assessed/performed           Past Medical History:  Diagnosis Date  . Arthritis   . Coronary artery disease involving native coronary artery of native heart 05/27/2017  . Dehydration   . Deviated septum   . HTN (hypertension) 08/12/2017  . Hypercholesteremia   . Hypercholesterolemia 12/10/2015  . Hypertension   . 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/2015  . Nasal turbinate hypertrophy   . Obstructive sleep apnea 06/2018   Not on CPAP until after sinus surgery; positional therapy not helpful  . 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 Vertigo     Past Surgical History:  Procedure Laterality Date  . CARDIAC SURGERY    . CORONARY ARTERY BYPASS GRAFT  2018  . ENDOSCOPIC CONCHA BULLOSA RESECTION Bilateral 08/02/2018   Procedure: ENDOSCOPIC BILATERAL CONCHA BULLOSA RESECTION;  Surgeon: Newman Pies, MD;  Location: [facility];  Service: ENT;  Laterality: Bilateral;  . ETHMOIDECTOMY Bilateral 08/02/2018   Procedure: TOTAL ETHMOIDECTOMY AND SPHENOIDECTOMY;  Surgeon: Newman Pies, MD;  Location: [facility];  Service: ENT;  Laterality: Bilateral;  . EXCISION MORTON'S NEUROMA Left   . FRONTAL SINUS EXPLORATION Bilateral 08/02/2018   Procedure: ENDOSCOPIC BILATERAL FRONTAL  RECESS EXPLORATION;  Surgeon: Newman Pies, MD;  Location: [facility];  Service: ENT;  Laterality: Bilateral;  . HERNIA REPAIR    . MAXILLARY ANTROSTOMY Bilateral 08/02/2018   Procedure: ENDOSCOPIC BILATERAL MAXILLARY ANTROSTOMY WITH TISSUE REMOVAL;  Surgeon: Newman Pies, MD;  Location: [facility];  Service: ENT;  Laterality: Bilateral;  . NASAL SEPTOPLASTY W/ TURBINOPLASTY Bilateral 08/02/2018   Procedure: NASAL SEPTOPLASTY WITH BILATERAL TURBINATE REDUCTION;  Surgeon: Newman Pies, MD;  Location: [facility];  Service: ENT;  Laterality: Bilateral;  . SINUS ENDO WITH FUSION Bilateral 08/02/2018   Procedure: SINUS ENDO WITH FUSION;  Surgeon: Newman Pies, MD;  Location: [facility];  Service: ENT;  Laterality: Bilateral;  . umbilical surgery      There were no vitals filed for this visit.    Subjective Assessment - 01/31/20 1339    Subjective Dizziness/vertigo    Patient is accompained by: Family member   Wife   Pertinent History Pt has been having vertigo since 12/09/19 which resulted in two ED visits.  He got so dizzy on 12/09/19 that he started vomiting. Vomiting lasted for multiple hours. Approximately 1 week later he had another episode while working out in the yard. He had to crawl back to the house in order to avoid falling. A few days later he had another episodes of vertigo while working on the computer. He took meclizine and went to bed. His wife came home  and pt had vomited in his sleep. He ended up with aspirative pneumonia and was admitted into the hospital. He is having daily headaches with photophobia. Headaches are focused more on the left side. No history of migraines. Pt with history of CAD s/p CABG, hypertension, hyperlipidemia, obstructive sleep apnea, mild vascular neurocognitive disorder for which patient has been followed by [facility] neurology. During his most recent hospital admission pt had an EEG to look for seizure activity but states that  he never received the results. CT head showed moderate ventriculomegaly, chronic microvascular ischemic changes. MRI brain as well as CTA head and neck obtained showed no acute abnormality. Chest x-ray showed bilateral hilar adenopathy with right lower lobe infiltrate concerning for aspiration pneumonia.  CT chest was obtained which showed bronchiolitis and 10 mm nodule at the left lung base. Patient was prescribed Meclizine and discharged with recommendation for outpatient vestibular therapy. Patient notices increased dizziness when he is sitting in front of the computer as well as when wearing his glasses. He also has dizziness when he sees flashing light. Symptoms improve when he rests, closes his eyes, and hydrates. He is unsure if the Meclizine helps or not.    Limitations Walking    Diagnostic tests CT head, MRI, CTA head, EEG (see history)    Patient Stated Goals Decrease dizziness    Currently in Pain? --   Not related to current episode            VESTIBULAR AND BALANCE EVALUATION   HISTORY:  Subjective history of current problem: Pt has been having vertigo since 12/09/19 which resulted in two ED visits.  He got so dizzy on 12/09/19 that he started vomiting. Vomiting lasted for multiple hours. Approximately 1 week later he had another episode while working out in the yard. He had to crawl back to the house in order to avoid falling. A few days later he had another episodes of vertigo while working on the computer. He took meclizine and went to bed. His wife came home and pt had vomited in his sleep. He ended up with aspirative pneumonia and was admitted into the hospital. He is having daily headaches with photophobia. Headaches are focused more on the left side. No history of migraines. Pt with history of CAD s/p CABG, hypertension, hyperlipidemia, obstructive sleep apnea, mild vascular neurocognitive disorder for which patient has been followed by [facility]y. During his most recent hospital  admission pt had an EEG to look for seizure activity but states that he never received the results. CT head showed moderate ventriculomegaly, chronic microvascular ischemic changes. MRI brain as well as CTA head and neck obtained showed no acute abnormality. Chest x-ray showed bilateral hilar adenopathy with right lower lobe infiltrate concerning for aspiration pneumonia. CT chest was obtained which showed bronchiolitis and 10 mm nodule at the left lung base. Patient was prescribed Meclizine and discharged with recommendation forr outpatient vestibular therapy. Patient notices increased dizziness when he is sitting in front of the computer as well as when wearing his glasses. He also has dizziness when he sees flashing light. Symptoms improve when he rests, closes his eyes, and hydrates. He is unsure if the Meclizine helps or not.   Description of dizziness: (vertigo, unsteadiness, lightheadedness, falling, general unsteadiness, whoozy, swimmy-headed sensation, aural fullness) vertigo, mild lightheadedness, unsteadiness Frequency: daily recently, previously was occurring farther apart Duration: minutes to hours Symptom nature: (motion provoked, positional, spontaneous, constant, variable, intermittent) Unclear, seems to occur spontaneously  Provocative Factors: Wearing eye  glasses, flashing lights Easing Factors: Sit down, close eyes, rest eyes, hydrate, unclear whether Meclizine helps  Progression of symptoms: (better, worse, no change since onset) More frequent but less intense History of similar episodes: Episode of vertigo at least 25 years ago but spontaneously resolved.  Falls (yes/no): No  Auditory complaints (tinnitus, pain, drainage, hearing loss, aural fullness): None Vision (diplopia, visual field loss, recent changes, last eye exam): None  Red Flags: (dysarthria, dysphagia, drop attacks, bowel and bladder changes, recent weight loss/gain) Review of systems negative for red flags.      EXAMINATION  POSTURE: Forward head and rounded shoulders  NEUROLOGICAL SCREEN: (2+ unless otherwise noted.) N=normal  Ab=abnormal  Level Dermatome R L Myotome R L Reflex R L  C3 Anterior Neck N N Sidebend C2-3 N N Jaw CN V    C4 Top of Shoulder N N Shoulder Shrug C4 N N Hoffman's UMN    C5 Lateral Upper Arm N N Shoulder ABD C4-5 N N Biceps C5-6    C6 Lateral Arm/ Thumb N N Arm Flex/ Wrist Ext C5-6 N N Brachiorad. C5-6    C7 Middle Finger N N Arm Ext//Wrist Flex C6-7 N N Triceps C7    C8 4th & 5th Finger N N Flex/ Ext Carpi Ulnaris C8 N N Patellar (L3-4)    T1 Medial Arm N N Interossei T1 N N Gastrocnemius    L2 Medial thigh/groin N N Illiopsoas (L2-3) N N     L3 Lower thigh/med.knee N N Quadriceps (L3-4) N N     L4 Medial leg/lat thigh N N Tibialis Ant (L4-5) N N     L5 Lat. leg & dorsal foot N N EHL (L5) N N     S1 post/lat foot/thigh/leg N N Gastrocnemius (S1-2) N N     S2 Post./med. thigh & leg N N Hamstrings (L4-S3) N N       Cranial Nerves Extraocular muscles are intact  Facial sensation is intact bilaterally  Facial strength is intact bilaterally  Hearing is normal as tested by gross conversation Palate elevates midline, normal phonation  Shoulder shrug strength is intact  Tongue protrudes midline    SOMATOSENSORY:         Sensation           Intact      Diminished         Absent  Light touch Normal      COORDINATION: Finger to Nose: Mildly dysmetric on the R side Heel to Shin: Normal Pronator Drift: Negative Rapid Alternating Movements: Normal Finger to Thumb Opposition: Normal  MUSCULOSKELETAL SCREEN: Cervical Spine ROM: Limited lateral flexion and extension, mild increase in neck pain with flexion   ROM: Grossly WNL  MMT: Grossly WNL  Functional Mobility: Independent with transfers and ambulation   POSTURAL CONTROL TESTS:   Clinical Test of Sensory Interaction for Balance    (CTSIB): Deferred  OCULOMOTOR / VESTIBULAR TESTING:  Oculomotor  Exam- Room Light  Findings Comments  Ocular Alignment normal   Ocular ROM normal   Spontaneous Nystagmus normal   Gaze-Holding Nystagmus normal   End-Gaze Nystagmus normal   Vergence (normal 2-3") not examined   Smooth Pursuit abnormal Significantly saccadic   Cross-Cover Test not examined   Saccades abnormal Hypometric horizontally, slow vertically  VOR Cancellation abnormal Slight blurring but no dizziness  Left Head Impulse abnormal Possible positive, pt struggles to relax so difficult to interpret  Right Head Impulse normal   Static Acuity not examined  Dynamic Acuity not examined     Oculomotor Exam- Fixation Suppressed: Deferred BPPV TESTS:  Symptoms Duration Intensity Nystagmus  L Dix-Hallpike None   None  R Dix-Hallpike None   None  L Head Roll None   None  R Head Roll None   None  L Sidelying Test      R Sidelying Test                    [facility] - 01/31/20 1340      Assessment   Medical Diagnosis Dizziness    Referring Provider (PT) Dr. Burnadette PopLinthavong    Onset Date/Surgical Date 01/01/20    Hand Dominance Right    Next MD Visit Not reported    Prior Therapy Not for vertigo/dizziness      Precautions   Precautions Fall      Restrictions   Weight Bearing Restrictions No      Balance Screen   Has the patient fallen in the past 6 months No    Has the patient had a decrease in activity level because of a fear of falling?  No    Is the patient reluctant to leave their home because of a fear of falling?  No      Home Environment   Living Environment Private residence    Living Arrangements Spouse/significant other    Available Help at Discharge Family      Prior Function   Level of Independence Independent      Cognition   Overall Cognitive Status History of cognitive impairments - at baseline   Conversation occasionally tangential, difficulty with detail                     Objective measurements completed on  examination: See above findings.               PT Education - 02/01/20 1300    Education Details Plan of care    Person(s) Educated Patient    Methods Explanation    Comprehension Verbalized understanding            PT Short Term Goals - 02/01/20 1317      PT SHORT TERM GOAL #1   Title Pt will be independent with HEP in order to improve strength, balance, and dizziness in order to decrease fall risk and improve function at home.    Time 4    Period Weeks    Status New    Target Date 02/29/20             PT Long Term Goals - 02/01/20 1318      PT LONG TERM GOAL #1   Title Pt will improve BERG by at least 3 points in order to demonstrate clinically significant improvement in balance.    Time 8    Period Weeks    Status New    Target Date 03/28/20      PT LONG TERM GOAL #2   Title Pt will improve DGI by at least 3 points in order to demonstrate clinically significant improvement in balance and decreased risk for falls.    Time 8    Period Weeks    Status New    Target Date 03/28/20      PT LONG TERM GOAL #3   Title Pt will decrease DHI score by at least 18 points in order to demonstrate clinically significant reduction in disability    Time 8  Period Weeks    Status New    Target Date 03/28/20                  Plan - 02/01/20 1309    Clinical Impression Statement Pt is a pleasant 74 year-old male referred for dizziness/vertigo. He has had multiple ED visits recently with an admission for aspiration PNA secondary to vomiting related to his vertigo. Pt arrived very late for his appointment and his history was very tangential leaving a limited amount of time to perform his examination. However most of his findings were central signs including saccadic smooth pursuits, abnormal horizontal and vertical saccade testing, as well as subjective reports of dizziness with VOR cancellation testing. He appears to possibly have a positive L HIT however he  struggles to relax his neck making his bedside exam difficult to interpret. Negative BPPV testing on this date. He had imaging during his hospital admission and no acute findings however multiple CT scans have reported concern for possible hydrocephalus and NPH which could be considered in his differental. His recurrent vertigo episodes do have some semblance to a Menires presentation however pt denies any aural fullness/hearing loss or tinnitus. Additional testing will be performed at next session for balance as well as fixation suppressed oculumotor/vestibular function. Pending findings pt may benefit from a referral to ENT for audiogram and VNG for more objective measures of his vestibular function and to more definitively determine the etiology of these episodes. Pt will benefit from skilled PT services to address deficits in balance and decrease risk for future falls.    Personal Factors and Comorbidities Age;Comorbidity 3+;Past/Current Experience;Time since onset of injury/illness/exacerbation    Comorbidities CAD, OSA, HTN, mild neurocognitive disorder    Examination-Activity Limitations Bend;Transfers;Locomotion Level    Examination-Participation Restrictions Community Activity;Yard Work;Other   Leisure activities   Stability/Clinical Decision Making Unstable/Unpredictable    Visual merchandiser    Rehab Potential Fair    PT Frequency 1x / week    PT Duration 8 weeks    PT Treatment/Interventions ADLs/Self Care Home Management;Aquatic Therapy;Biofeedback;Canalith Repostioning;Cryotherapy;Electrical Stimulation;Iontophoresis 4mg /ml Dexamethasone;Moist Heat;Ultrasound;Traction;DME Instruction;Gait training;Stair training;Functional mobility training;Therapeutic activities;Therapeutic exercise;Balance training;Neuromuscular re-education;Cognitive remediation;Patient/family education;Manual techniques;Passive range of motion;Dry needling;Energy conservation;Vestibular;Joint Manipulations     PT Next Visit Plan Have pt complete FOTO, ABC, DHI, DGI/FGA, BERG, 5TSTS, IR goggles for fixation suppression testing,    PT Home Exercise Plan None currently    Consulted and Agree with Plan of Care Patient           Patient will benefit from skilled therapeutic intervention in order to improve the following deficits and impairments:  Dizziness  Visit Diagnosis: Dizziness and giddiness     Problem List Patient Active Problem List   Diagnosis Date Noted  . Mixed hyperlipidemia 12/29/2019  . Hilar adenopathy 12/29/2019  . Dehydration 12/29/2019  . Unexplained weight loss 12/29/2019  . Aspiration pneumonia of right lower lobe due to gastric secretions (HCC) 12/29/2019  . Acute metabolic encephalopathy 12/29/2019  . CAP (community acquired pneumonia) 12/29/2019  . Sleep apnea 09/26/2019  . OSA (obstructive sleep apnea) 08/03/2019  . Nocturia more than twice per night 08/03/2019  . 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  . Vivid dream 08/03/2019  . 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 Essential hypertension 08/12/2017  . 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 Coronary artery disease involving native coronary artery of native heart without angina pectoris 05/27/2017  . Elevated blood-pressure reading without diagnosis of hypertension 12/10/2015  . Hypercholesterolemia  12/10/2015  . 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/2015  . Nasal polyps 12/10/2015  . Poorly-controlled hypertension 12/10/2015  . Enlarged prostate 05/20/2011  . Insomnia 05/20/2011     Lynnea Maizes PT, DPT, GCS  Shye Doty 02/01/2020, 1:23 PM  [place] [facility] MAIN [facility] SERVICES [address] [place], [place], [address] Phone: [phone]   Fax:  [phone]  Name: Omar Kennedy MRN: [id number] Date of Birth: September 17, 1945

## 2020-02-06 ENCOUNTER — Ambulatory Visit: Payer: Medicare PPO

## 2020-02-06 ENCOUNTER — Other Ambulatory Visit: Payer: Self-pay

## 2020-02-06 DIAGNOSIS — R42 Dizziness and giddiness: Secondary | ICD-10-CM

## 2020-02-06 NOTE — Therapy (Signed)
[place] [facility] MAIN [facility] SERVICES [address] [place], [place], [address] Phone: [phone]   Fax:  [phone]  Physical Therapy Treatment  Patient Details  Name: Omar Kennedy MRN: [id number] Date of Birth: 1945-08-06 Referring Provider (PT): Dr. Burnadette Pop   Encounter Date: 02/06/2020   PT End of Session - 02/06/20 1126    Visit Number 2    Number of Visits 9    Date for PT Re-Evaluation 03/28/20    Authorization Type eval: 02/01/20    PT Start Time 1120    PT Stop Time 1200    PT Time Calculation (min) 40 min    Activity Tolerance Patient tolerated treatment well    Behavior During Therapy [facility] for tasks assessed/performed           Past Medical History:  Diagnosis Date  . Arthritis   . Coronary artery disease involving native coronary artery of native heart 05/27/2017  . Dehydration   . Deviated septum   . HTN (hypertension) 08/12/2017  . Hypercholesteremia   . Hypercholesterolemia 12/10/2015  . Hypertension   . 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/2015  . Nasal turbinate hypertrophy   . Obstructive sleep apnea 06/2018   Not on CPAP until after sinus surgery; positional therapy not helpful  . 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 Vertigo     Past Surgical History:  Procedure Laterality Date  . CARDIAC SURGERY    . CORONARY ARTERY BYPASS GRAFT  2018  . ENDOSCOPIC CONCHA BULLOSA RESECTION Bilateral 08/02/2018   Procedure: ENDOSCOPIC BILATERAL CONCHA BULLOSA RESECTION;  Surgeon: Newman Pies, MD;  Location: [facility];  Service: ENT;  Laterality: Bilateral;  . ETHMOIDECTOMY Bilateral 08/02/2018   Procedure: TOTAL ETHMOIDECTOMY AND SPHENOIDECTOMY;  Surgeon: Newman Pies, MD;  Location: [facility];  Service: ENT;  Laterality: Bilateral;  . EXCISION MORTON'S NEUROMA Left   . FRONTAL SINUS EXPLORATION Bilateral 08/02/2018   Procedure: ENDOSCOPIC BILATERAL FRONTAL  RECESS EXPLORATION;  Surgeon: Newman Pies, MD;  Location: [facility];  Service: ENT;  Laterality: Bilateral;  . HERNIA REPAIR    . MAXILLARY ANTROSTOMY Bilateral 08/02/2018   Procedure: ENDOSCOPIC BILATERAL MAXILLARY ANTROSTOMY WITH TISSUE REMOVAL;  Surgeon: Newman Pies, MD;  Location: [facility];  Service: ENT;  Laterality: Bilateral;  . NASAL SEPTOPLASTY W/ TURBINOPLASTY Bilateral 08/02/2018   Procedure: NASAL SEPTOPLASTY WITH BILATERAL TURBINATE REDUCTION;  Surgeon: Newman Pies, MD;  Location: [facility];  Service: ENT;  Laterality: Bilateral;  . SINUS ENDO WITH FUSION Bilateral 08/02/2018   Procedure: SINUS ENDO WITH FUSION;  Surgeon: Newman Pies, MD;  Location: [facility];  Service: ENT;  Laterality: Bilateral;  . umbilical surgery      There were no vitals filed for this visit.   Subjective Assessment - 02/06/20 1124    Subjective Pt reports that he is doing well today. He has felt really good over the last couple days and has not had any further episodes of vertigo. He has been wearing his reading glasses today and has noticed he developed a headache this morning.    Patient is accompained by: Family member   Wife   Pertinent History Pt has been having vertigo since 12/09/19 which resulted in two ED visits.  He got so dizzy on 12/09/19 that he started vomiting. Vomiting lasted for multiple hours. Approximately 1 week later he had another episode while working out  in the yard. He had to crawl back to the house in order to avoid falling. A few days later he had another episodes of vertigo while working on the computer. He took meclizine and went to bed. His wife came home and pt had vomited in his sleep. He ended up with aspirative pneumonia and was admitted into the hospital. He is having daily headaches with photophobia. Headaches are focused more on the left side. No history of migraines. Pt with history of CAD s/p CABG, hypertension, hyperlipidemia,  obstructive sleep apnea, mild vascular neurocognitive disorder for which patient has been followed by [facility] neurology. During his most recent hospital admission pt had an EEG to look for seizure activity but states that he never received the results. CT head showed moderate ventriculomegaly, chronic microvascular ischemic changes. MRI brain as well as CTA head and neck obtained showed no acute abnormality. Chest x-ray showed bilateral hilar adenopathy with right lower lobe infiltrate concerning for aspiration pneumonia.  CT chest was obtained which showed bronchiolitis and 10 mm nodule at the left lung base. Patient was prescribed Meclizine and discharged with recommendation for outpatient vestibular therapy. Patient notices increased dizziness when he is sitting in front of the computer as well as when wearing his glasses. He also has dizziness when he sees flashing light. Symptoms improve when he rests, closes his eyes, and hydrates. He is unsure if the Meclizine helps or not.    Limitations Walking    Diagnostic tests CT head, MRI, CTA head, EEG (see history)    Patient Stated Goals Decrease dizziness    Currently in Pain? Yes    Pain Score 4     Pain Location Head    Pain Descriptors / Indicators Headache    Pain Type Acute pain    Pain Onset Today            TREATMENT   Neuromuscular Re-education  Pt completed DHI, ABC, and FOTO questionnaires, see results below (unbilled);    Oculomotor Exam- Fixation Suppressed  Findings Comments  Ocular Alignment normal   Spontaneous Nystagmus normal   Gaze-Holding Nystagmus normal   End-Gaze Nystagmus normal   Head Shaking Nystagmus normal   Pressure-Induced Nystagmus normal   Hyperventilation Induced Nystagmus normal   Skull Vibration Induced Nystagmus abnormal L horizontal beating nystagus with stimluation to R mastoid, R horizontal beating nystagmus with stimulation to the L mastoid. Nystagmus appears slightly more visible with stimulation to  R mastoid.     Performed outcome measures with patient (see results below)   FUNCTIONAL OUTCOME MEASURES   02/06/20 Comments  BERG 49/56 Fall risk, in need of intervention  Static visual acuity 20/20   Dynamic visual acuity 20/40 Three line loss, positive   5TSTS 8.43 seconds WNL  FOTO 94 Predicted decline to 92  10 Meter Gait Speed Self-selected: 8.06s = 1.24  m/s; Fastest: 5.5s = 1.81 m/s WNL  ABC Scale 93.125% WNL  DHI 34/100 Moderate perception of handicap    VOR x 1 horizontal in sitting 60s x 3, issued to HEP, pt reports 2/10 dizziness during first two bouts;   Pt educated throughout session about proper posture and technique with exercises. Improved exercise technique, movement at target joints after min to mod verbal, visual, tactile cues.    Completed additional examination and outcome measures with patient during session today. He has some horizontal beating nystagmus with skull vibration and DVA is positive for 3 line loss. 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 Issued  VOR x 1 horizontal for HEP and performed with patient during session. Pt also demonstrates moderate perception of handicap with DHI of 34/100 and balance deficits as identified by BERG of 49/56. 5TSTS, 29m gait speed, and ABC are all WNL. Pt encouraged to start HEP and follow-up as scheduled. Pt will benefit from PT services to address deficits in dizziness and balance in order to return to full function at home.                 PT Short Term Goals - 02/01/20 1317      PT SHORT TERM GOAL #1   Title Pt will be independent with HEP in order to improve strength, balance, and dizziness in order to decrease fall risk and improve function at home.    Time 4    Period Weeks    Status New    Target Date 02/29/20             PT Long Term Goals - 02/01/20 1318      PT LONG TERM GOAL #1   Title Pt will improve BERG by at least 3 points in order to demonstrate clinically  significant improvement in balance.    Time 8    Period Weeks    Status New    Target Date 03/28/20      PT LONG TERM GOAL #2   Title Pt will improve DGI by at least 3 points in order to demonstrate clinically significant improvement in balance and decreased risk for falls.    Time 8    Period Weeks    Status New    Target Date 03/28/20      PT LONG TERM GOAL #3   Title Pt will decrease DHI score by at least 18 points in order to demonstrate clinically significant reduction in disability    Time 8    Period Weeks    Status New    Target Date 03/28/20                 Plan - 02/06/20 1126    Clinical Impression Statement Completed additional examination and outcome measures with patient during session today. He has some horizontal beating nystagmus with skull vibration and DVA is positive for 3 line loss. 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 Issued VOR x 1 horizontal for HEP and performed with patient during session. Pt also demonstrates moderate perception of handicap with DHI of 34/100 and balance deficits as identified by BERG of 49/56. 5TSTS, 60m gait speed, and ABC are all WNL. Pt encouraged to start HEP and follow-up as scheduled. Pt will benefit from PT services to address deficits in dizziness and balance in order to return to full function at home.    Personal Factors and Comorbidities Age;Comorbidity 3+;Past/Current Experience;Time since onset of injury/illness/exacerbation    Comorbidities CAD, OSA, HTN, mild neurocognitive disorder    Examination-Activity Limitations [place];Transfers;Locomotion Level    Examination-Participation Restrictions Community Activity;Yard Work;Other   Leisure activities   Stability/Clinical Decision Making Unstable/Unpredictable    Rehab Potential Fair    PT Frequency 1x / week    PT Duration 8 weeks    PT Treatment/Interventions ADLs/Self Care Home Management;Aquatic Therapy;Biofeedback;Canalith  Repostioning;Cryotherapy;Electrical Stimulation;Iontophoresis 4mg /ml Dexamethasone;Moist Heat;Ultrasound;Traction;DME Instruction;Gait training;Stair training;Functional mobility training;Therapeutic activities;Therapeutic exercise;Balance training;Neuromuscular re-education;Cognitive remediation;Patient/family education;Manual techniques;Passive range of motion;Dry needling;Energy conservation;Vestibular;Joint Manipulations    PT Next Visit Plan Review VOR x 1 horizontal, progress gaze stabilization as well as habituation/balance exercises.    PT Home  Exercise Plan None currently    Consulted and Agree with Plan of Care Patient           Patient will benefit from skilled therapeutic intervention in order to improve the following deficits and impairments:  Dizziness  Visit Diagnosis: Dizziness and giddiness     Problem List Patient Active Problem List   Diagnosis Date Noted  . Mixed hyperlipidemia 12/29/2019  . Hilar adenopathy 12/29/2019  . Dehydration 12/29/2019  . Unexplained weight loss 12/29/2019  . Aspiration pneumonia of right lower lobe due to gastric secretions (HCC) 12/29/2019  . Acute metabolic encephalopathy 12/29/2019  . CAP (community acquired pneumonia) 12/29/2019  . Sleep apnea 09/26/2019  . OSA (obstructive sleep apnea) 08/03/2019  . Nocturia more than twice per night 08/03/2019  . 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  . Vivid dream 08/03/2019  . 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 Essential hypertension 08/12/2017  . 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 Coronary artery disease involving native coronary artery of native heart without angina pectoris 05/27/2017  . Elevated blood-pressure reading without diagnosis of hypertension 12/10/2015  . Hypercholesterolemia 12/10/2015  . 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/2015  . Nasal polyps 12/10/2015  . Poorly-controlled hypertension 12/10/2015  . Enlarged  prostate 05/20/2011  . Insomnia 05/20/2011   Lynnea MaizesJason D Siboney Requejo PT, DPT, GCS  Hallelujah Wysong 02/06/2020, 2:42 PM  [place] [facility] [address] [place], [place], [address] Phone: [phone]   Fax:  [phone]  Name: Cheri GuppyJohn S Cranmore MRN: [id number] Date of Birth: [birth year]

## 2020-02-06 NOTE — Patient Instructions (Signed)
Access Code: [email or web address] URL: [email or web address] Date: 02/06/2020 Prepared by: Ria Comment  Exercises Seated Gaze Stabilization with Head Rotation - 4 x daily - 7 x weekly - 3 reps - 60 seconds hold

## 2020-02-13 ENCOUNTER — Ambulatory Visit: Payer: Medicare PPO

## 2020-02-13 ENCOUNTER — Other Ambulatory Visit: Payer: Self-pay

## 2020-02-13 DIAGNOSIS — R42 Dizziness and giddiness: Secondary | ICD-10-CM | POA: Diagnosis not present

## 2020-02-13 NOTE — Patient Instructions (Signed)
Access Code: [email or web address] URL: [email or web address] Date: 02/13/2020 Prepared by: Ria Comment  Exercises Standing Gaze Stabilization with Head Rotation - 4 x daily - 7 x weekly - 3 reps - 60 seconds hold Tandem Stance with Head Rotation - 4 x daily - 7 x weekly - 3 reps - 30 seconds hold Standing Single Leg Stance with Unilateral Counter Support - 2 x daily - 7 x weekly - 3 x 30s on each leg hold

## 2020-02-13 NOTE — Therapy (Signed)
[place] MAIN [facility] SERVICES [address] [place], [place], [address] Phone: [phone]   Fax:  [phone]  Physical Therapy Treatment  Patient Details  Name: Omar Kennedy MRN: [id number] Date of Birth: Jul 06, 1946 Referring Provider (PT): Dr. Netty Starring   Encounter Date: 02/13/2020   PT End of Session - 02/13/20 1308    Visit Number 3    Number of Visits 9    Date for PT Re-Evaluation 03/28/20    Authorization Type eval: 02/01/20    PT Start Time 5885    PT Stop Time 1345    PT Time Calculation (min) 40 min    Activity Tolerance Patient tolerated treatment well    Behavior During Therapy [facility] for tasks assessed/performed           Past Medical History:  Diagnosis Date   Arthritis    Coronary artery disease involving native coronary artery of native heart 05/27/2017   Dehydration    Deviated septum    HTN (hypertension) 08/12/2017   Hypercholesteremia    Hypercholesterolemia 12/10/2015   Hypertension    Mild vascular neurocognitive disorder ([place]) 06/22/2019   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/2015   Nasal turbinate hypertrophy    Obstructive sleep apnea 06/2018   Not on CPAP until after sinus surgery; positional therapy not helpful   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   Vertigo     Past Surgical History:  Procedure Laterality Date   CARDIAC SURGERY     CORONARY ARTERY BYPASS GRAFT  2018   ENDOSCOPIC CONCHA BULLOSA RESECTION Bilateral 08/02/2018   Procedure: ENDOSCOPIC BILATERAL CONCHA BULLOSA RESECTION;  Surgeon: Leta Baptist, MD;  Location: [place];  Service: ENT;  Laterality: Bilateral;   ETHMOIDECTOMY Bilateral 08/02/2018   Procedure: TOTAL ETHMOIDECTOMY AND SPHENOIDECTOMY;  Surgeon: Leta Baptist, MD;  Location: [place];  Service: ENT;  Laterality: Bilateral;   EXCISION MORTON'S NEUROMA Left    FRONTAL SINUS EXPLORATION Bilateral 08/02/2018   Procedure: ENDOSCOPIC BILATERAL FRONTAL  RECESS EXPLORATION;  Surgeon: Leta Baptist, MD;  Location: [place];  Service: ENT;  Laterality: Bilateral;   HERNIA REPAIR     MAXILLARY ANTROSTOMY Bilateral 08/02/2018   Procedure: ENDOSCOPIC BILATERAL MAXILLARY ANTROSTOMY WITH TISSUE REMOVAL;  Surgeon: Leta Baptist, MD;  Location: [place];  Service: ENT;  Laterality: Bilateral;   NASAL SEPTOPLASTY W/ TURBINOPLASTY Bilateral 08/02/2018   Procedure: NASAL SEPTOPLASTY WITH BILATERAL TURBINATE REDUCTION;  Surgeon: Leta Baptist, MD;  Location: [place];  Service: ENT;  Laterality: Bilateral;   SINUS ENDO WITH FUSION Bilateral 08/02/2018   Procedure: SINUS ENDO WITH FUSION;  Surgeon: Leta Baptist, MD;  Location: [place];  Service: ENT;  Laterality: Bilateral;   umbilical surgery      There were no vitals filed for this visit.   Subjective Assessment - 02/13/20 1307    Subjective Pt reports that he is doing well today. He has not had any further episodes of dizziness since last therapy session and overall feels like generally his health is doing better. No specific questions or concerns at this time.    Patient is accompained by: Family member   Wife   Pertinent History Pt has been having vertigo since 12/09/19 which resulted in two ED visits.  He got so dizzy on 12/09/19 that he started vomiting. Vomiting lasted for multiple hours. Approximately 1 week later he had another episode while working out in the yard. He had  to crawl back to the house in order to avoid falling. A few days later he had another episodes of vertigo while working on the computer. He took meclizine and went to bed. His wife came home and pt had vomited in his sleep. He ended up with aspirative pneumonia and was admitted into the hospital. He is having daily headaches with photophobia. Headaches are focused more on the left side. No history of migraines. Pt with history of CAD s/p CABG, hypertension, hyperlipidemia, obstructive  sleep apnea, mild vascular neurocognitive disorder for which patient has been followed by [facility] neurology. During his most recent hospital admission pt had an EEG to look for seizure activity but states that he never received the results. CT head showed moderate ventriculomegaly, chronic microvascular ischemic changes. MRI brain as well as CTA head and neck obtained showed no acute abnormality. Chest x-ray showed bilateral hilar adenopathy with right lower lobe infiltrate concerning for aspiration pneumonia.  CT chest was obtained which showed bronchiolitis and 10 mm nodule at the left lung base. Patient was prescribed Meclizine and discharged with recommendation for outpatient vestibular therapy. Patient notices increased dizziness when he is sitting in front of the computer as well as when wearing his glasses. He also has dizziness when he sees flashing light. Symptoms improve when he rests, closes his eyes, and hydrates. He is unsure if the Meclizine helps or not.    Limitations Walking    Diagnostic tests CT head, MRI, CTA head, EEG (see history)    Patient Stated Goals Decrease dizziness    Currently in Pain? No/denies             TREATMENT   Neuromuscular Re-education  VOR x 1 horizontal in sitting x 60s, pt denies dizziness VOR x 1 horizontal in standing WBOS and NBOS x 60s each, pt denies dizziness Semitandem balance alternating forward LE x 30s each; Semitandem balance alternating forward LE with horizontal and vertical head turns x 30s each; Tandem gait on 2"x4" x 6 lengths; Side stepping on 2"x4" x 6 lengths; Pt completed DHI and FOTO Performed BERG and DGI with patient;    FUNCTIONAL OUTCOME MEASURES     02/06/20 01/2720 Comments  BERG 49/56 55/56  Fall risk, in need of intervention  Static visual acuity 20/20 NT   Dynamic visual acuity 20/40 NT Three line loss, positive   5TSTS 8.43 seconds NT WNL  FOTO 94 79 Predicted decline to 92  10 Meter Gait Speed Self-selected:  8.06s = 1.24  m/s; Fastest: 5.5s = 1.81 m/s NT WNL  ABC Scale 93.125% NT WNL  DHI 34/100 0/100 No handicap  DGI NT 24/24 WNL     Pt educated throughout session about proper posture and technique with exercises. Improved exercise technique, movement at target joints after min to mod verbal, visual, tactile cues.    Updated outcome measures and goals with patient. He met all of his long term goals scoring a 56/56 on his BERG and a 24/24 on the DGI. His [place] decreased to 0/100 indicating no disability. Pt reports he has not had any further dizziness since his initial evaluation and feels like he doesn't need any additional therapy. He would like to schedule one more appointment one month from now just in case his symptoms return. If he doesn't need to return for therapy pt reports that he will call and cancel. Pt encouraged to continue HEP and follow-up if needed otherwise he will be discharged.  PT Short Term Goals - 02/13/20 1330      PT SHORT TERM GOAL #1   Title Pt will be independent with HEP in order to improve strength, balance, and dizziness in order to decrease fall risk and improve function at home.    Time 4    Period Weeks    Status On-going    Target Date 02/29/20             PT Long Term Goals - 02/13/20 1351      PT LONG TERM GOAL #1   Title Pt will improve BERG by at least 3 points in order to demonstrate clinically significant improvement in balance.    Baseline 02/06/20: 49/56; 02/13/20: 55/56    Time 8    Period Weeks    Status Achieved      PT LONG TERM GOAL #2   Title Pt will improve DGI by at least 3 points in order to demonstrate clinically significant improvement in balance and decreased risk for falls.    Baseline 02/13/20: 24/24 (first time completed)    Time 8    Period Weeks    Status Achieved      PT LONG TERM GOAL #3   Title Pt will decrease DHI score by at least 18 points in order to demonstrate  clinically significant reduction in disability    Baseline 02/06/20: 34/100; 02/13/20: 0/100    Time 8    Period Weeks    Status Achieved                 Plan - 02/13/20 1309    Clinical Impression Statement Updated outcome measures and goals with patient. He met all of his long term goals scoring a 56/56 on his BERG and a 24/24 on the DGI. His [place] decreased to 0/100 indicating no disability. Pt reports he has not had any further dizziness since his initial evaluation and feels like he doesn't need any additional therapy. He would like to schedule one more appointment one month from now just in case his symptoms return. If he doesn't need to return for therapy pt reports that he will call and cancel. Pt encouraged to continue HEP and follow-up if needed otherwise he will be discharged.    Personal Factors and Comorbidities Age;Comorbidity 3+;Past/Current Experience;Time since onset of injury/illness/exacerbation    Comorbidities CAD, OSA, HTN, mild neurocognitive disorder    Examination-Activity Limitations [place];Transfers;Locomotion Level    Examination-Participation Restrictions Community Activity;Yard Work;Other   Leisure activities   Stability/Clinical Decision Making Unstable/Unpredictable    Rehab Potential Fair    PT Frequency 1x / week    PT Duration 8 weeks    PT Treatment/Interventions ADLs/Self Care Home Management;Aquatic Therapy;Biofeedback;Canalith Repostioning;Cryotherapy;Electrical Stimulation;Iontophoresis 81m/ml Dexamethasone;Moist Heat;Ultrasound;Traction;DME Instruction;Gait training;Stair training;Functional mobility training;Therapeutic activities;Therapeutic exercise;Balance training;Neuromuscular re-education;Cognitive remediation;Patient/family education;Manual techniques;Passive range of motion;Dry needling;Energy conservation;Vestibular;Joint Manipulations    PT Next Visit Plan Review HEP and discharge    PT HN[place]and Agree with  Plan of Care Patient           Patient will benefit from skilled therapeutic intervention in order to improve the following deficits and impairments:  Dizziness  Visit Diagnosis: Dizziness and giddiness     Problem List Patient Active Problem List   Diagnosis Date Noted   Mixed hyperlipidemia 12/29/2019   Hilar adenopathy 12/29/2019   Dehydration 12/29/2019   Unexplained weight loss 12/29/2019   Aspiration pneumonia of right lower lobe due to gastric  secretions ([place]) 76/16/0760   Acute metabolic encephalopathy 66/78/5547   CAP (community acquired pneumonia) 12/29/2019   Sleep apnea 09/26/2019   OSA (obstructive sleep apnea) 08/03/2019   Nocturia more than twice per night 08/03/2019   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   Vivid dream 08/03/2019   Mild vascular neurocognitive disorder ([place]) 06/22/2019   Essential hypertension 08/12/2017   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 [place], [place], [address] Phone: [phone]   Fax:  [phone]  Name: Omar Kennedy MRN: [id number] Date of Birth: 03-09-1946

## 2020-02-15 ENCOUNTER — Ambulatory Visit: Payer: Self-pay | Admitting: Adult Health

## 2020-03-06 ENCOUNTER — Telehealth: Payer: Self-pay | Admitting: Neurology

## 2020-03-06 NOTE — Telephone Encounter (Signed)
Copy of insurance card faxed to [phone]

## 2020-03-06 NOTE — Telephone Encounter (Signed)
[employer] 03/05/20 @ 4:33pm:  "Caller states they called earlier for a copy of a pts insurance card and it was never received. Pt is waiting to be seen."

## 2020-03-06 NOTE — Telephone Encounter (Signed)
At 8:52 AM called [facility] and left message for caller, have not received call back.

## 2020-04-11 ENCOUNTER — Ambulatory Visit: Payer: Medicare PPO | Admitting: Family Medicine

## 2020-04-15 ENCOUNTER — Ambulatory Visit: Payer: Medicare PPO

## 2020-06-07 ENCOUNTER — Other Ambulatory Visit: Payer: Self-pay | Admitting: Neurology

## 2020-06-21 ENCOUNTER — Encounter: Payer: Medicare PPO | Admitting: Psychology

## 2020-06-28 ENCOUNTER — Encounter: Payer: Medicare PPO | Admitting: Psychology

## 2020-07-26 ENCOUNTER — Ambulatory Visit: Payer: Medicare PPO | Admitting: Neurology

## 2021-02-18 ENCOUNTER — Other Ambulatory Visit: Payer: Self-pay | Admitting: Family Medicine

## 2021-02-18 DIAGNOSIS — Z0000 Encounter for general adult medical examination without abnormal findings: Secondary | ICD-10-CM

## 2021-02-18 DIAGNOSIS — Z87891 Personal history of nicotine dependence: Secondary | ICD-10-CM

## 2021-03-06 ENCOUNTER — Ambulatory Visit: Payer: Medicare PPO

## 2021-03-18 ENCOUNTER — Ambulatory Visit: Admission: RE | Admit: 2021-03-18 | Payer: Medicare PPO | Source: Ambulatory Visit

## 2021-03-20 ENCOUNTER — Other Ambulatory Visit: Payer: Self-pay | Admitting: Pulmonary Disease

## 2021-03-20 DIAGNOSIS — R1310 Dysphagia, unspecified: Secondary | ICD-10-CM

## 2021-04-18 ENCOUNTER — Other Ambulatory Visit: Payer: Self-pay

## 2021-04-18 ENCOUNTER — Ambulatory Visit
Admission: RE | Admit: 2021-04-18 | Discharge: 2021-04-18 | Disposition: A | Discharge status: Home / Self Care | Payer: Medicare PPO | Source: Ambulatory Visit | Attending: Pulmonary Disease | Admitting: Pulmonary Disease

## 2021-04-18 DIAGNOSIS — R1310 Dysphagia, unspecified: Secondary | ICD-10-CM | POA: Insufficient documentation

## 2021-04-18 NOTE — Progress Notes (Signed)
Modified Barium Swallow Progress Note  Patient Details  Name: Omar Kennedy MRN: [id number] Date of Birth: [birth year]  Today's Date: 04/18/2021  Modified Barium Swallow completed.  Full report located under Chart Review in the Imaging Section.  Brief recommendations include the following:  Clinical Impression  Pt demonstrates adequate oropharyngeal abilities when consuming thin liquids, puree, solids and whole barium time with thin liquids. Pt reported a globus sensation with burping present throughout the study. There appeared to be slowed clearing of boluses through the esophagus. Recommend further esophageal evaluation by GI. Education provided to pt and his wife on general aspiration precautions as well as reflux precautions.   Swallow Evaluation Recommendations   Recommended Consults: Consider GI evaluation;Consider esophageal assessment   SLP Diet Recommendations: Regular solids;Thin liquid   Liquid Administration via: Cup   Medication Administration: Whole meds with liquid   Supervision: Patient able to self feed   Compensations: Minimize environmental distractions;Slow rate;Small sips/bites   Postural Changes: Remain semi-upright after after feeds/meals (Comment);Seated upright at 90 degrees   Oral Care Recommendations: Oral care BID      Deborrah Mabin B. Dreama Saa M.S., CCC-SLP, [facility] Speech-Language Pathologist Rehabilitation Services Office [phone]   Marnisha Stampley Dreama Saa 04/18/2021,2:01 PM

## 2021-10-14 ENCOUNTER — Other Ambulatory Visit: Payer: Self-pay | Admitting: Infectious Diseases

## 2021-10-14 DIAGNOSIS — Z77120 Contact with and (suspected) exposure to mold (toxic): Secondary | ICD-10-CM

## 2021-10-14 DIAGNOSIS — J411 Mucopurulent chronic bronchitis: Secondary | ICD-10-CM

## 2021-10-18 IMAGING — RF DG SWALLOWING FUNCTION
10 series · 12 of 24 positions shown · non-contrast
Comparison: None.

CLINICAL DATA: Dysphagia.

EXAM:
MODIFIED BARIUM SWALLOW
TECHNIQUE: Different consistencies of barium were administered orally to the
patient by the Speech Pathologist. Imaging of the pharynx was
performed in the lateral projection. The radiologist was present in
the fluoroscopy room for this study, providing personal supervision.
FLUOROSCOPY TIME:  Fluoroscopy Time:  2.5 minute
Radiation Exposure Index (if provided by the fluoroscopic device):
19.6 mGy
Number of Acquired Spot Images: 0

[Series 1: cp_standard · 0.08mm/px · 1 of 300 frames shown (1 of 10)]
[frame 256/300]
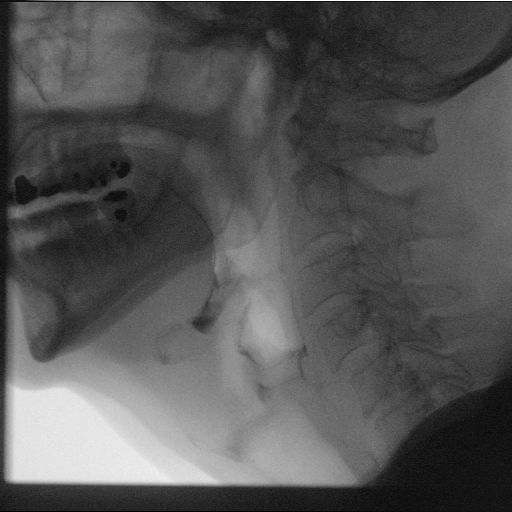

[Series 2: cp_standard · 0.08mm/px · 1 of 143 frames shown (2 of 10)]
[frame 72/143]
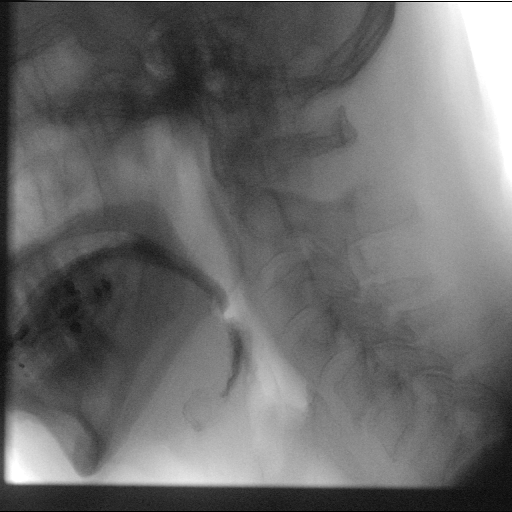

[Series 3: cp_standard · 0.08mm/px · 1 of 573 frames shown (3 of 10)]
[frame 86/573]
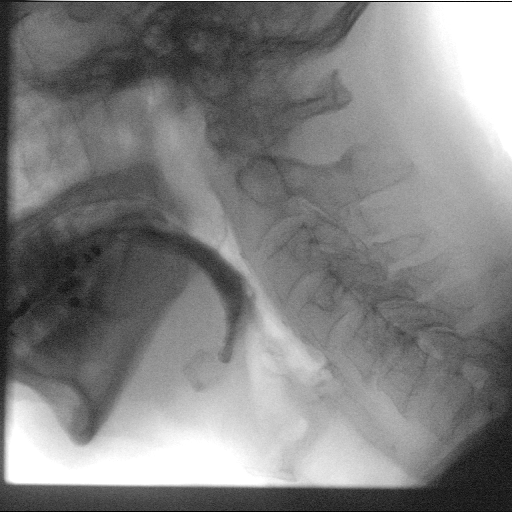

[Series 4: cp_standard · 0.08mm/px · 2 of 604 frames shown (4 of 10)]
[frame 91/604]
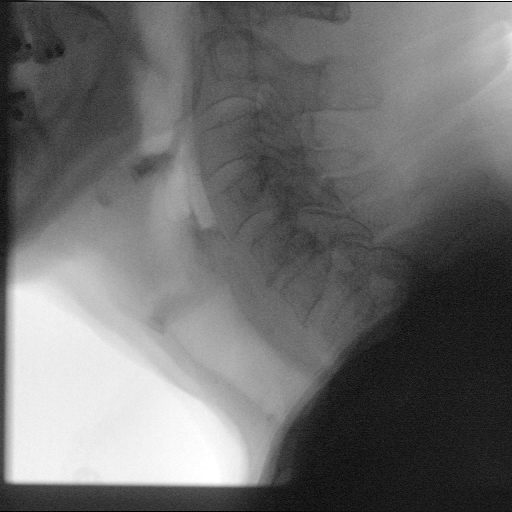
[frame 514/604]
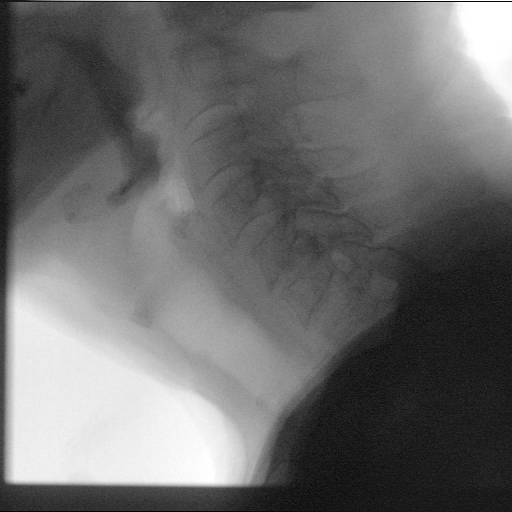

[Series 5: cp_standard · 0.08mm/px · 1 of 270 frames shown (5 of 10)]
[frame 230/270]
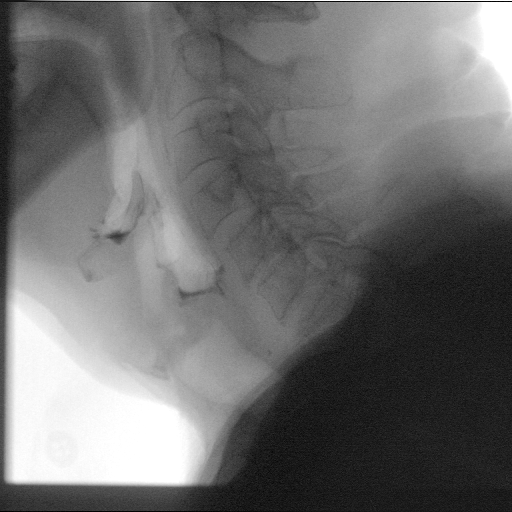

[Series 6: cp_standard · 0.08mm/px · 1 of 21 frames shown (6 of 10)]
[frame 11/21]
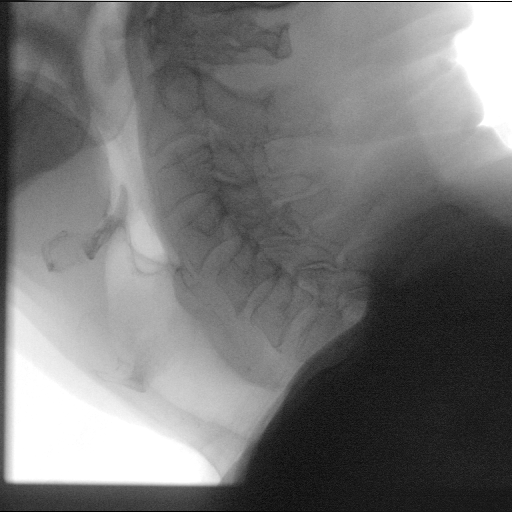

[Series 7: cp_standard · 0.08mm/px · 1 of 318 frames shown (7 of 10)]
[frame 160/318]
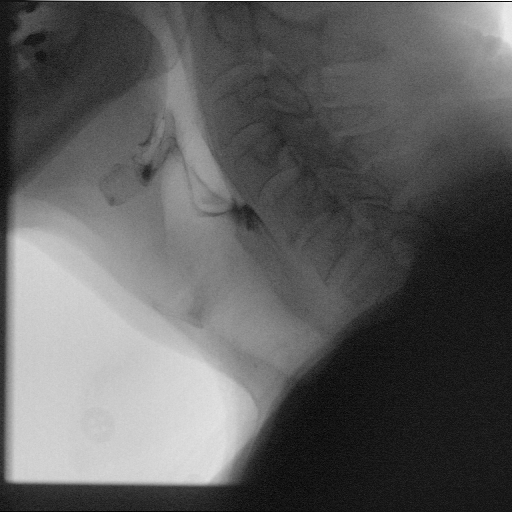

[Series 8: cp_standard · 0.08mm/px · 1 of 166 frames shown (8 of 10)]
[frame 25/166]
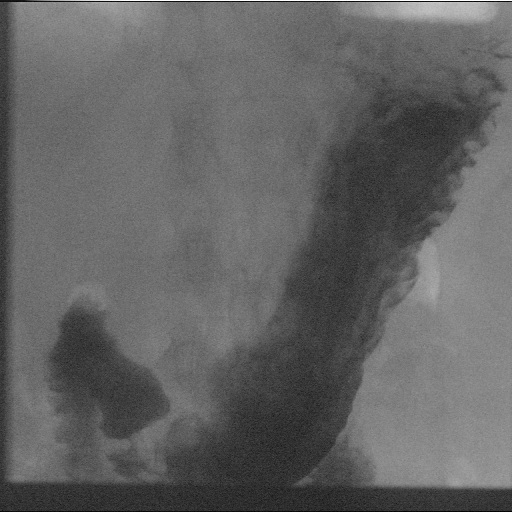

[Series 9: cp_standard · 0.25mm/px · 1 of 146 frames shown (9 of 10)]
[frame 22/146]
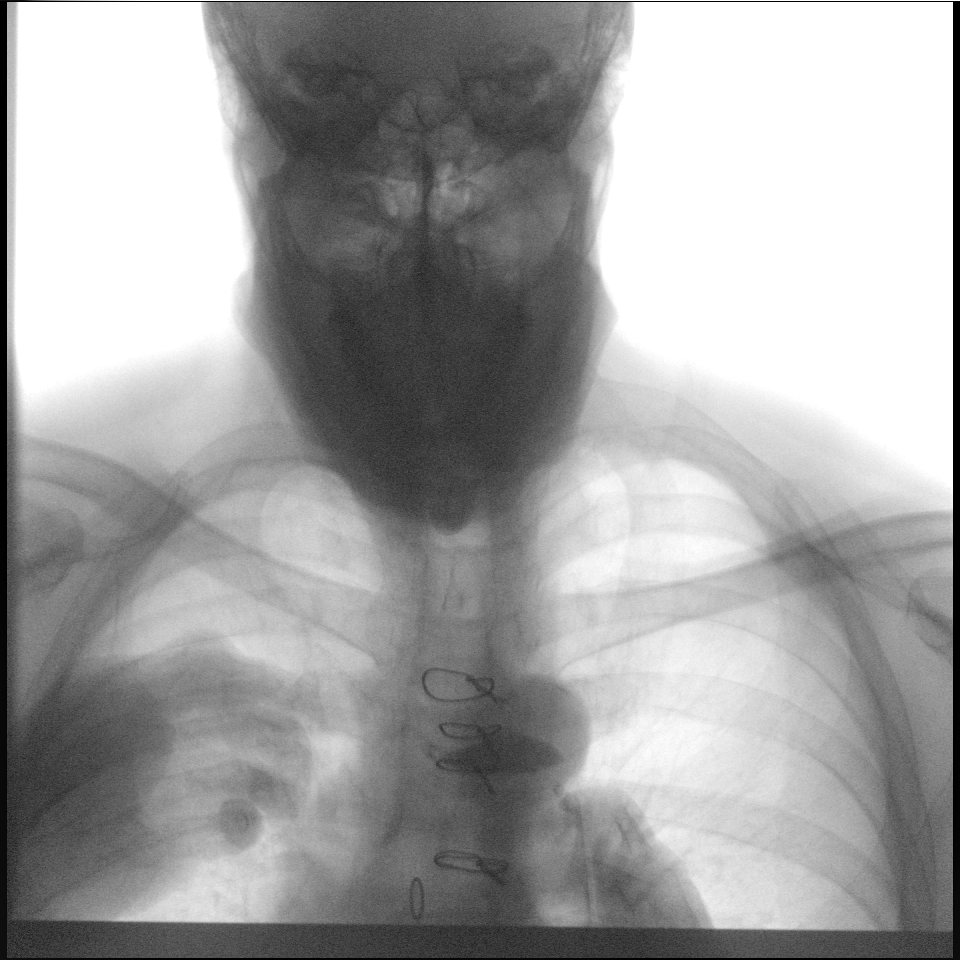

[Series 10: cp_standard · 0.25mm/px · 2 of 209 frames shown (10 of 10)]
[frame 32/209]
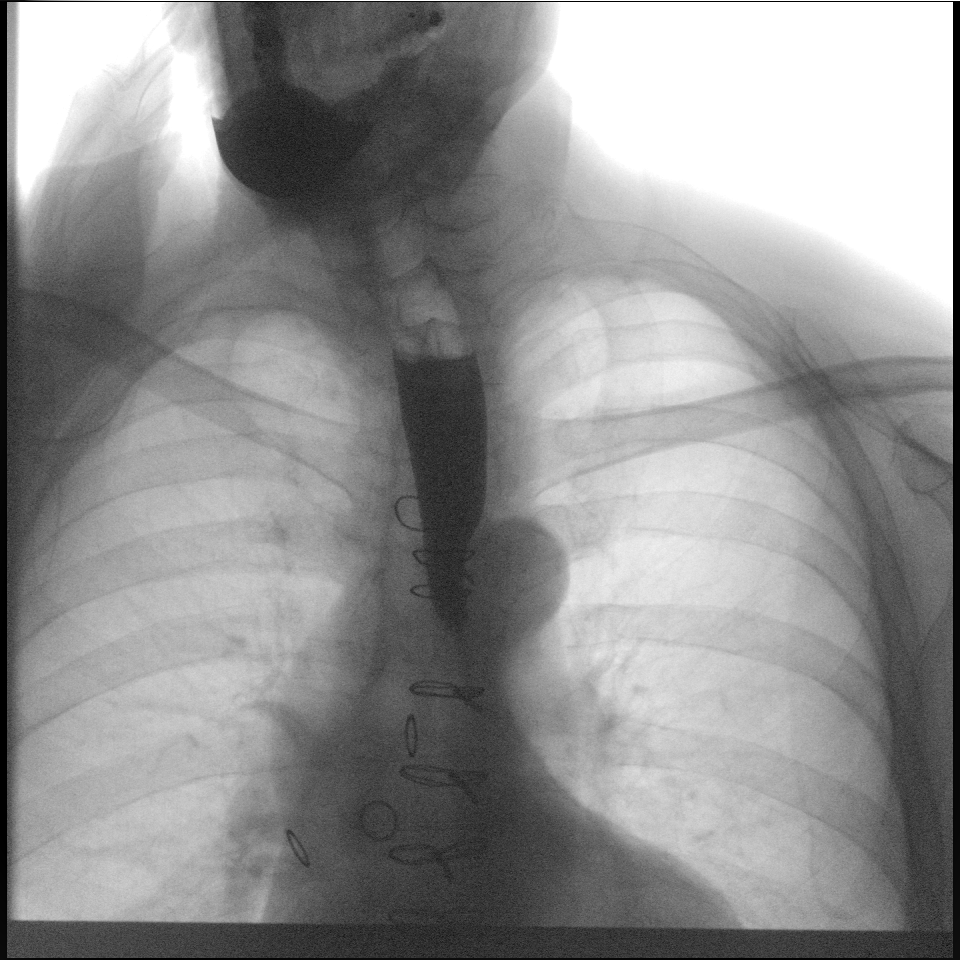
[frame 178/209]
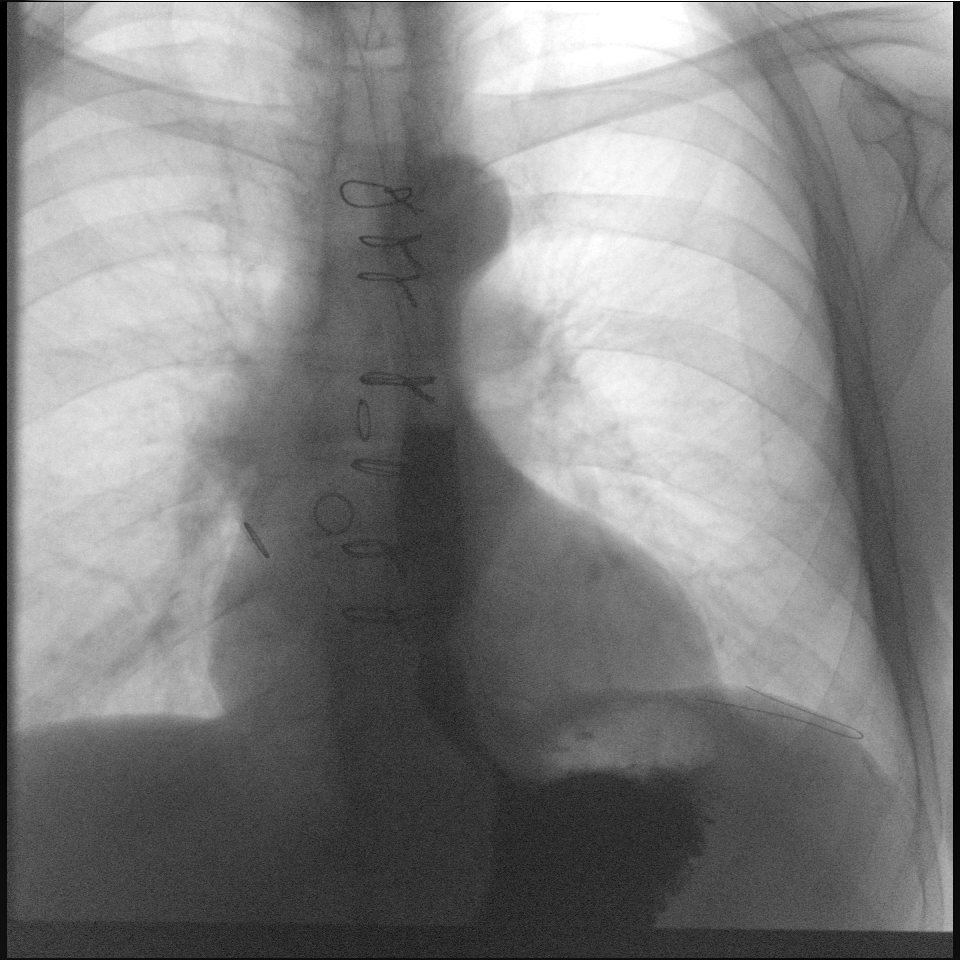

[12 of 24 positions shown; findings below may reference images not displayed]

FINDINGS: Real-time fluoroscopy of the swallowing function was performed with
a speech pathologist present.

Multiple consistencies of barium were administered which included
water, nectar, applesauce, and Panther cracker.

Normal swallow function. No laryngeal penetration or tracheal
aspiration.
IMPRESSION: Modified barium swallow as described above.

Please refer to the Speech Pathologists report for complete details
and recommendations.

## 2021-10-23 ENCOUNTER — Ambulatory Visit
Admission: RE | Admit: 2021-10-23 | Discharge: 2021-10-23 | Disposition: A | Discharge status: Home / Self Care | Payer: Medicare PPO | Source: Ambulatory Visit | Attending: Infectious Diseases | Admitting: Infectious Diseases

## 2021-10-23 DIAGNOSIS — J411 Mucopurulent chronic bronchitis: Secondary | ICD-10-CM | POA: Insufficient documentation

## 2021-10-23 DIAGNOSIS — Z77120 Contact with and (suspected) exposure to mold (toxic): Secondary | ICD-10-CM | POA: Insufficient documentation

## 2022-03-30 ENCOUNTER — Other Ambulatory Visit: Payer: Self-pay | Admitting: Neurology

## 2022-03-30 DIAGNOSIS — Z8673 Personal history of transient ischemic attack (TIA), and cerebral infarction without residual deficits: Secondary | ICD-10-CM

## 2022-03-30 DIAGNOSIS — F0150 Vascular dementia without behavioral disturbance: Secondary | ICD-10-CM

## 2022-03-30 DIAGNOSIS — H532 Diplopia: Secondary | ICD-10-CM

## 2022-04-02 ENCOUNTER — Ambulatory Visit
Admission: RE | Admit: 2022-04-02 | Discharge: 2022-04-02 | Disposition: A | Discharge status: Home / Self Care | Payer: Medicare PPO | Source: Ambulatory Visit | Attending: Neurology | Admitting: Neurology

## 2022-04-02 DIAGNOSIS — Z8673 Personal history of transient ischemic attack (TIA), and cerebral infarction without residual deficits: Secondary | ICD-10-CM | POA: Diagnosis present

## 2022-04-02 DIAGNOSIS — G309 Alzheimer's disease, unspecified: Secondary | ICD-10-CM | POA: Insufficient documentation

## 2022-04-02 DIAGNOSIS — H532 Diplopia: Secondary | ICD-10-CM | POA: Insufficient documentation

## 2022-04-02 DIAGNOSIS — F0150 Vascular dementia without behavioral disturbance: Secondary | ICD-10-CM | POA: Diagnosis present

## 2022-04-02 DIAGNOSIS — F0280 Dementia in other diseases classified elsewhere without behavioral disturbance: Secondary | ICD-10-CM | POA: Diagnosis present

## 2022-04-02 MED ORDER — GADOBUTROL 1 MMOL/ML IV SOLN
7.5000 mL | Freq: Once | INTRAVENOUS | Status: AC | PRN
  Administered 2022-04-02: 7.5 mL via INTRAVENOUS

## 2023-01-04 ENCOUNTER — Encounter (HOSPITAL_COMMUNITY): Payer: Self-pay | Admitting: General Surgery

## 2023-01-04 ENCOUNTER — Inpatient Hospital Stay (HOSPITAL_COMMUNITY)
Admission: EM | Admit: 2023-01-04 | Discharge: 2023-01-22 | DRG: 963 | Disposition: A | Discharge status: Skilled Nursing Facility | Payer: Medicare PPO | Attending: Surgery | Admitting: Surgery

## 2023-01-04 ENCOUNTER — Emergency Department (HOSPITAL_COMMUNITY): Payer: Medicare PPO

## 2023-01-04 DIAGNOSIS — S0101XA Laceration without foreign body of scalp, initial encounter: Secondary | ICD-10-CM

## 2023-01-04 DIAGNOSIS — S42109A Fracture of unspecified part of scapula, unspecified shoulder, initial encounter for closed fracture: Secondary | ICD-10-CM

## 2023-01-04 DIAGNOSIS — E43 Unspecified severe protein-calorie malnutrition: Secondary | ICD-10-CM | POA: Insufficient documentation

## 2023-01-04 DIAGNOSIS — T1490XA Injury, unspecified, initial encounter: Secondary | ICD-10-CM

## 2023-01-04 DIAGNOSIS — S069XAA Unspecified intracranial injury with loss of consciousness status unknown, initial encounter: Secondary | ICD-10-CM

## 2023-01-04 DIAGNOSIS — S22008A Other fracture of unspecified thoracic vertebra, initial encounter for closed fracture: Secondary | ICD-10-CM

## 2023-01-04 DIAGNOSIS — W19XXXA Unspecified fall, initial encounter: Principal | ICD-10-CM

## 2023-01-04 DIAGNOSIS — W1789XA Other fall from one level to another, initial encounter: Secondary | ICD-10-CM

## 2023-01-04 DIAGNOSIS — S2249XA Multiple fractures of ribs, unspecified side, initial encounter for closed fracture: Secondary | ICD-10-CM

## 2023-01-04 DIAGNOSIS — S20211A Contusion of right front wall of thorax, initial encounter: Secondary | ICD-10-CM | POA: Diagnosis present

## 2023-01-04 DIAGNOSIS — S22049A Unspecified fracture of fourth thoracic vertebra, initial encounter for closed fracture: Secondary | ICD-10-CM | POA: Diagnosis present

## 2023-01-04 DIAGNOSIS — R402242 Coma scale, best verbal response, confused conversation, at arrival to emergency department: Secondary | ICD-10-CM | POA: Diagnosis present

## 2023-01-04 DIAGNOSIS — E785 Hyperlipidemia, unspecified: Secondary | ICD-10-CM | POA: Diagnosis present

## 2023-01-04 DIAGNOSIS — S32039A Unspecified fracture of third lumbar vertebra, initial encounter for closed fracture: Secondary | ICD-10-CM | POA: Diagnosis present

## 2023-01-04 DIAGNOSIS — Z66 Do not resuscitate: Secondary | ICD-10-CM | POA: Diagnosis present

## 2023-01-04 DIAGNOSIS — F02818 Dementia in other diseases classified elsewhere, unspecified severity, with other behavioral disturbance: Secondary | ICD-10-CM | POA: Diagnosis present

## 2023-01-04 DIAGNOSIS — I700 Atherosclerosis of aorta: Secondary | ICD-10-CM | POA: Diagnosis present

## 2023-01-04 DIAGNOSIS — R52 Pain, unspecified: Secondary | ICD-10-CM | POA: Diagnosis not present

## 2023-01-04 DIAGNOSIS — R29810 Facial weakness: Secondary | ICD-10-CM | POA: Diagnosis not present

## 2023-01-04 DIAGNOSIS — Z881 Allergy status to other antibiotic agents status: Secondary | ICD-10-CM

## 2023-01-04 DIAGNOSIS — D62 Acute posthemorrhagic anemia: Secondary | ICD-10-CM | POA: Diagnosis present

## 2023-01-04 DIAGNOSIS — S065XAA Traumatic subdural hemorrhage with loss of consciousness status unknown, initial encounter: Secondary | ICD-10-CM | POA: Diagnosis present

## 2023-01-04 DIAGNOSIS — R569 Unspecified convulsions: Secondary | ICD-10-CM | POA: Diagnosis not present

## 2023-01-04 DIAGNOSIS — R402142 Coma scale, eyes open, spontaneous, at arrival to emergency department: Secondary | ICD-10-CM | POA: Diagnosis present

## 2023-01-04 DIAGNOSIS — S42001A Fracture of unspecified part of right clavicle, initial encounter for closed fracture: Secondary | ICD-10-CM | POA: Diagnosis present

## 2023-01-04 DIAGNOSIS — R451 Restlessness and agitation: Secondary | ICD-10-CM | POA: Diagnosis not present

## 2023-01-04 DIAGNOSIS — R531 Weakness: Secondary | ICD-10-CM | POA: Diagnosis not present

## 2023-01-04 DIAGNOSIS — W173XXA Fall into empty swimming pool, initial encounter: Secondary | ICD-10-CM

## 2023-01-04 DIAGNOSIS — N179 Acute kidney failure, unspecified: Secondary | ICD-10-CM | POA: Diagnosis present

## 2023-01-04 DIAGNOSIS — S066XAA Traumatic subarachnoid hemorrhage with loss of consciousness status unknown, initial encounter: Principal | ICD-10-CM | POA: Diagnosis present

## 2023-01-04 DIAGNOSIS — R64 Cachexia: Secondary | ICD-10-CM | POA: Diagnosis present

## 2023-01-04 DIAGNOSIS — S42101A Fracture of unspecified part of scapula, right shoulder, initial encounter for closed fracture: Secondary | ICD-10-CM | POA: Diagnosis present

## 2023-01-04 DIAGNOSIS — G9341 Metabolic encephalopathy: Secondary | ICD-10-CM | POA: Diagnosis not present

## 2023-01-04 DIAGNOSIS — S22089A Unspecified fracture of T11-T12 vertebra, initial encounter for closed fracture: Secondary | ICD-10-CM | POA: Diagnosis present

## 2023-01-04 DIAGNOSIS — S42001D Fracture of unspecified part of right clavicle, subsequent encounter for fracture with routine healing: Secondary | ICD-10-CM | POA: Diagnosis not present

## 2023-01-04 DIAGNOSIS — F01518 Vascular dementia, unspecified severity, with other behavioral disturbance: Secondary | ICD-10-CM | POA: Diagnosis present

## 2023-01-04 DIAGNOSIS — I10 Essential (primary) hypertension: Secondary | ICD-10-CM | POA: Diagnosis present

## 2023-01-04 DIAGNOSIS — Z6824 Body mass index (BMI) 24.0-24.9, adult: Secondary | ICD-10-CM

## 2023-01-04 DIAGNOSIS — Z23 Encounter for immunization: Secondary | ICD-10-CM

## 2023-01-04 DIAGNOSIS — Z7189 Other specified counseling: Secondary | ICD-10-CM | POA: Diagnosis not present

## 2023-01-04 DIAGNOSIS — G4733 Obstructive sleep apnea (adult) (pediatric): Secondary | ICD-10-CM | POA: Diagnosis present

## 2023-01-04 DIAGNOSIS — Z711 Person with feared health complaint in whom no diagnosis is made: Secondary | ICD-10-CM | POA: Diagnosis not present

## 2023-01-04 DIAGNOSIS — R739 Hyperglycemia, unspecified: Secondary | ICD-10-CM | POA: Diagnosis present

## 2023-01-04 DIAGNOSIS — S42102A Fracture of unspecified part of scapula, left shoulder, initial encounter for closed fracture: Secondary | ICD-10-CM | POA: Diagnosis present

## 2023-01-04 DIAGNOSIS — Z951 Presence of aortocoronary bypass graft: Secondary | ICD-10-CM

## 2023-01-04 DIAGNOSIS — S270XXA Traumatic pneumothorax, initial encounter: Secondary | ICD-10-CM | POA: Diagnosis present

## 2023-01-04 DIAGNOSIS — S1093XA Contusion of unspecified part of neck, initial encounter: Secondary | ICD-10-CM | POA: Diagnosis present

## 2023-01-04 DIAGNOSIS — I959 Hypotension, unspecified: Secondary | ICD-10-CM | POA: Diagnosis present

## 2023-01-04 DIAGNOSIS — Y92009 Unspecified place in unspecified non-institutional (private) residence as the place of occurrence of the external cause: Secondary | ICD-10-CM

## 2023-01-04 DIAGNOSIS — G301 Alzheimer's disease with late onset: Secondary | ICD-10-CM | POA: Diagnosis present

## 2023-01-04 DIAGNOSIS — S2241XA Multiple fractures of ribs, right side, initial encounter for closed fracture: Secondary | ICD-10-CM | POA: Diagnosis present

## 2023-01-04 DIAGNOSIS — Z515 Encounter for palliative care: Secondary | ICD-10-CM

## 2023-01-04 DIAGNOSIS — G8191 Hemiplegia, unspecified affecting right dominant side: Secondary | ICD-10-CM | POA: Diagnosis present

## 2023-01-04 DIAGNOSIS — S0181XA Laceration without foreign body of other part of head, initial encounter: Secondary | ICD-10-CM | POA: Diagnosis present

## 2023-01-04 DIAGNOSIS — D72829 Elevated white blood cell count, unspecified: Secondary | ICD-10-CM | POA: Diagnosis present

## 2023-01-04 DIAGNOSIS — F02811 Dementia in other diseases classified elsewhere, unspecified severity, with agitation: Secondary | ICD-10-CM | POA: Diagnosis present

## 2023-01-04 DIAGNOSIS — R4701 Aphasia: Secondary | ICD-10-CM | POA: Diagnosis not present

## 2023-01-04 DIAGNOSIS — Z79899 Other long term (current) drug therapy: Secondary | ICD-10-CM

## 2023-01-04 DIAGNOSIS — R402362 Coma scale, best motor response, obeys commands, at arrival to emergency department: Secondary | ICD-10-CM | POA: Diagnosis present

## 2023-01-04 DIAGNOSIS — E782 Mixed hyperlipidemia: Secondary | ICD-10-CM | POA: Diagnosis not present

## 2023-01-04 DIAGNOSIS — I2510 Atherosclerotic heart disease of native coronary artery without angina pectoris: Secondary | ICD-10-CM | POA: Diagnosis not present

## 2023-01-04 DIAGNOSIS — F03918 Unspecified dementia, unspecified severity, with other behavioral disturbance: Secondary | ICD-10-CM | POA: Diagnosis not present

## 2023-01-04 DIAGNOSIS — R638 Other symptoms and signs concerning food and fluid intake: Secondary | ICD-10-CM | POA: Diagnosis not present

## 2023-01-04 DIAGNOSIS — G4700 Insomnia, unspecified: Secondary | ICD-10-CM | POA: Diagnosis present

## 2023-01-04 DIAGNOSIS — Z789 Other specified health status: Secondary | ICD-10-CM | POA: Diagnosis not present

## 2023-01-04 DIAGNOSIS — Z7982 Long term (current) use of aspirin: Secondary | ICD-10-CM

## 2023-01-04 DIAGNOSIS — R1110 Vomiting, unspecified: Secondary | ICD-10-CM | POA: Diagnosis not present

## 2023-01-04 LAB — CBC
HCT: 32.2 % — ABNORMAL LOW (ref 39.0–52.0)
Hemoglobin: 10.8 g/dL — ABNORMAL LOW (ref 13.0–17.0)
MCH: 29.9 pg (ref 26.0–34.0)
MCHC: 33.5 g/dL (ref 30.0–36.0)
MCV: 89.2 fL (ref 80.0–100.0)
Platelets: 179 10*3/uL (ref 150–400)
RBC: 3.61 MIL/uL — ABNORMAL LOW (ref 4.22–5.81)
RDW: 13.7 % (ref 11.5–15.5)
WBC: 35.3 10*3/uL — ABNORMAL HIGH (ref 4.0–10.5)
nRBC: 0.1 % (ref 0.0–0.2)

## 2023-01-04 LAB — COMPREHENSIVE METABOLIC PANEL
ALT: 40 U/L (ref 0–44)
AST: 69 U/L — ABNORMAL HIGH (ref 15–41)
Albumin: 3.2 g/dL — ABNORMAL LOW (ref 3.5–5.0)
Alkaline Phosphatase: 52 U/L (ref 38–126)
Anion gap: 13 (ref 5–15)
BUN: 21 mg/dL (ref 8–23)
CO2: 18 mmol/L — ABNORMAL LOW (ref 22–32)
Calcium: 8.5 mg/dL — ABNORMAL LOW (ref 8.9–10.3)
Chloride: 111 mmol/L (ref 98–111)
Creatinine, Ser: 1.95 mg/dL — ABNORMAL HIGH (ref 0.61–1.24)
GFR, Estimated: 35 mL/min — ABNORMAL LOW (ref >60)
Glucose, Bld: 218 mg/dL — ABNORMAL HIGH (ref 70–99)
Potassium: 3.9 mmol/L (ref 3.5–5.1)
Sodium: 142 mmol/L (ref 135–145)
Total Bilirubin: 2.4 mg/dL — ABNORMAL HIGH (ref 0.3–1.2)
Total Protein: 4.9 g/dL — ABNORMAL LOW (ref 6.5–8.1)

## 2023-01-04 LAB — TYPE AND SCREEN: Antibody Screen: NEGATIVE

## 2023-01-04 LAB — TROPONIN I (HIGH SENSITIVITY)
Troponin I (High Sensitivity): 61 ng/L — ABNORMAL HIGH (ref <18)
Troponin I (High Sensitivity): 143 ng/L (ref <18)

## 2023-01-04 LAB — I-STAT CHEM 8, ED
BUN: 28 mg/dL — ABNORMAL HIGH (ref 8–23)
Calcium, Ion: 1.07 mmol/L — ABNORMAL LOW (ref 1.15–1.40)
Chloride: 110 mmol/L (ref 98–111)
Creatinine, Ser: 1.8 mg/dL — ABNORMAL HIGH (ref 0.61–1.24)
Glucose, Bld: 209 mg/dL — ABNORMAL HIGH (ref 70–99)
HCT: 30 % — ABNORMAL LOW (ref 39.0–52.0)
Hemoglobin: 10.2 g/dL — ABNORMAL LOW (ref 13.0–17.0)
Potassium: 4.2 mmol/L (ref 3.5–5.1)
Sodium: 142 mmol/L (ref 135–145)
TCO2: 19 mmol/L — ABNORMAL LOW (ref 22–32)

## 2023-01-04 LAB — BPAM RBC

## 2023-01-04 LAB — URINALYSIS, ROUTINE W REFLEX MICROSCOPIC
Bilirubin Urine: NEGATIVE
Glucose, UA: NEGATIVE mg/dL
Ketones, ur: NEGATIVE mg/dL
Leukocytes,Ua: NEGATIVE
Nitrite: NEGATIVE
Protein, ur: 30 mg/dL — AB
Specific Gravity, Urine: 1.025 (ref 1.005–1.030)
pH: 5 (ref 5.0–8.0)

## 2023-01-04 LAB — CBG MONITORING, ED: Glucose-Capillary: 200 mg/dL — ABNORMAL HIGH (ref 70–99)

## 2023-01-04 LAB — LACTIC ACID, PLASMA: Lactic Acid, Venous: 7.6 mmol/L (ref 0.5–1.9)

## 2023-01-04 LAB — ETHANOL: Alcohol, Ethyl (B): <10 mg/dL (ref <10)

## 2023-01-04 LAB — MRSA NEXT GEN BY PCR, NASAL: MRSA by PCR Next Gen: NOT DETECTED

## 2023-01-04 LAB — PROTIME-INR
INR: 1.7 — ABNORMAL HIGH (ref 0.8–1.2)
Prothrombin Time: 19.9 seconds — ABNORMAL HIGH (ref 11.4–15.2)

## 2023-01-04 LAB — ABO/RH: ABO/RH(D): O NEG

## 2023-01-04 LAB — PREPARE RBC (CROSSMATCH)

## 2023-01-04 LAB — CK: Total CK: 1972 U/L — ABNORMAL HIGH (ref 49–397)

## 2023-01-04 MED ORDER — LIDOCAINE 5 % EX PTCH
2.0000 | MEDICATED_PATCH | CUTANEOUS | Status: DC
  Administered 2023-01-05 – 2023-01-22 (×16): 2 via TRANSDERMAL
  Filled 2023-01-04 (×17): qty 2

## 2023-01-04 MED ORDER — ONDANSETRON 4 MG PO TBDP
4.0000 mg | ORAL_TABLET | Freq: Four times a day (QID) | ORAL | Status: DC | PRN
  Administered 2023-01-14: 4 mg via ORAL
  Filled 2023-01-04: qty 1

## 2023-01-04 MED ORDER — ONDANSETRON HCL 4 MG/2ML IJ SOLN
4.0000 mg | Freq: Four times a day (QID) | INTRAMUSCULAR | Status: DC | PRN
  Administered 2023-01-14: 4 mg via INTRAVENOUS
  Filled 2023-01-04: qty 2

## 2023-01-04 MED ORDER — ORAL CARE MOUTH RINSE: 15.0000 mL | OROMUCOSAL | Status: DC | PRN

## 2023-01-04 MED ORDER — HYDRALAZINE HCL 20 MG/ML IJ SOLN: 10.0000 mg | INTRAMUSCULAR | Status: DC | PRN

## 2023-01-04 MED ORDER — CEFAZOLIN SODIUM-DEXTROSE 2-4 GM/100ML-% IV SOLN
2.0000 g | Freq: Once | INTRAVENOUS | Status: AC
  Administered 2023-01-04: 2 g via INTRAVENOUS

## 2023-01-04 MED ORDER — ORAL CARE MOUTH RINSE
15.0000 mL | OROMUCOSAL | Status: DC
  Administered 2023-01-05 – 2023-01-18 (×32): 15 mL via OROMUCOSAL

## 2023-01-04 MED ORDER — METOPROLOL TARTRATE 5 MG/5ML IV SOLN: 5.0000 mg | Freq: Four times a day (QID) | INTRAVENOUS | Status: DC | PRN

## 2023-01-04 MED ORDER — TETANUS-DIPHTH-ACELL PERTUSSIS 5-2.5-18.5 LF-MCG/0.5 IM SUSY
0.5000 mL | PREFILLED_SYRINGE | Freq: Once | INTRAMUSCULAR | Status: AC
  Administered 2023-01-04: 0.5 mL via INTRAMUSCULAR

## 2023-01-04 MED ORDER — FENTANYL CITRATE PF 50 MCG/ML IJ SOSY
12.5000 ug | PREFILLED_SYRINGE | INTRAMUSCULAR | Status: DC | PRN
  Administered 2023-01-04 – 2023-01-05 (×5): 25 ug via INTRAVENOUS
  Filled 2023-01-04 (×5): qty 1

## 2023-01-04 MED ORDER — SODIUM CHLORIDE 0.9 % IV SOLN: INTRAVENOUS | Status: DC

## 2023-01-04 MED ORDER — LEVETIRACETAM IN NACL 500 MG/100ML IV SOLN
500.0000 mg | Freq: Two times a day (BID) | INTRAVENOUS | Status: DC
  Administered 2023-01-04 – 2023-01-07 (×7): 500 mg via INTRAVENOUS
  Filled 2023-01-04 (×7): qty 100

## 2023-01-04 MED ORDER — SODIUM CHLORIDE 0.9% IV SOLUTION: Freq: Once | INTRAVENOUS | Status: DC

## 2023-01-04 MED ORDER — TRAMADOL HCL 50 MG PO TABS
50.0000 mg | ORAL_TABLET | Freq: Four times a day (QID) | ORAL | Status: DC | PRN
  Administered 2023-01-04 – 2023-01-07 (×6): 50 mg via ORAL
  Filled 2023-01-04 (×6): qty 1

## 2023-01-04 MED ORDER — POLYETHYLENE GLYCOL 3350 17 G PO PACK
17.0000 g | PACK | Freq: Every day | ORAL | Status: DC | PRN
  Administered 2023-01-12: 17 g via ORAL
  Filled 2023-01-04: qty 1

## 2023-01-04 MED ORDER — ACETAMINOPHEN 500 MG PO TABS
1000.0000 mg | ORAL_TABLET | Freq: Four times a day (QID) | ORAL | Status: DC
  Administered 2023-01-04 – 2023-01-22 (×41): 1000 mg via ORAL
  Filled 2023-01-04 (×44): qty 2

## 2023-01-04 MED ORDER — IOHEXOL 350 MG/ML SOLN
75.0000 mL | Freq: Once | INTRAVENOUS | Status: AC | PRN
  Administered 2023-01-04: 75 mL via INTRAVENOUS

## 2023-01-04 MED ORDER — DOCUSATE SODIUM 100 MG PO CAPS
100.0000 mg | ORAL_CAPSULE | Freq: Two times a day (BID) | ORAL | Status: DC
  Administered 2023-01-04 – 2023-01-21 (×29): 100 mg via ORAL
  Filled 2023-01-04 (×32): qty 1

## 2023-01-04 NOTE — ED Notes (Signed)
Trauma Response Nurse Documentation   Omar Kennedy is a 77 y.o. male arriving to [facility] ED via [facility]  On No antithrombotic. Trauma was activated as a Level 2/ upgraded to Level 1 by Charge RN and EDP based on the following trauma criteria Anytime Systolic Blood Pressure < 90.  Patient cleared for CT by Dr. Bedelia Person. Pt transported to CT with trauma response nurse present to monitor. RN remained with the patient throughout their absence from the department for clinical observation.   GCS 14.  History   Chart will be merged        Initial Focused Assessment (If applicable, or please see trauma documentation):  Airway -- Clear Breathing - unlabored-  Circulation - laceration to back of head, dried blood covering face and hands, -  GCS - 14-- hx of dementia,   CT's Completed:   CT Head, CT C-Spine, CT Chest w/ contrast, and CT abdomen/pelvis w/ contrast   Interventions:  Labs Xrays CT scans IV fluids Transfusion x 1UPRBC Admit   Plan for disposition:  Admission to ICU   Consults completed:  Neurosurgeon at 1530.  Event Summary:  TO ED via [facility], from home-- hx of dementia, lives with wife, who is wheelchair bound- when wife woke up this am, pt was not in the house. She called neighbors to help look for him. He was found at the bottom of an inground swimming pool (without water) where he had fallen sat some point last night. There was a large amount of blood in the pool, pt with dried blood on face/hands/covering body- laceration to back of head- stapled per trauma PA.  Pt is confused, but at baseline per what wife told EMS. Is able to move all extremities, does not follow commands or direction. Is re-directable, but impulsive. Trying to get out of bed.     Bedside handoff with ED RN Omar Kennedy, and Omar North RN.    Omar Kennedy  Trauma Response RN  Please call TRN at [phone] for further assistance.

## 2023-01-04 NOTE — ED Provider Notes (Signed)
[facility] EMERGENCY DEPARTMENT AT [facility] Provider Note   CSN: [id number] Arrival date & time: 01/04/23  1411     History  Chief Complaint  Patient presents with   Marletta Lor    Omar Kennedy is a 77 y.o. male.   Fall     77 year old male with medical history significant for dementia who presents to the emergency department after an unwitnessed fall.  The history is provided by EMS as the patient's dementia limited the history.  He was reportedly last seen at 2000 last night by his wife.  He was found after seeming to have fallen into an empty pool.  He was found by neighbors while yelling at the bottom of the pool.  The patient received a 500 cc bolus with EMS and was 96/50 with a heart rate in the 120s.  Initially activated as a level 2 trauma.  The patient was found to have significant bleeding on scene and sustained an obvious scalp laceration.  He arrived airway and breathing intact.  Initially was hypotensive BP 82/65 and he was upgraded to a level 1 trauma and administered 1 unit PRBCs in addition to an IV fluid bolus of 1 L.  Home Medications Prior to Admission medications   Medication Sig Start Date End Date Taking? Authorizing Provider  acetaminophen (TYLENOL) 500 MG tablet Take 1,000 mg by mouth every 6 (six) hours as needed for headache.   Yes [provider]  amLODipine (NORVASC) 5 MG tablet Take 5 mg by mouth daily. 12/22/22  Yes [provider]  aspirin EC 81 MG tablet Take 81 mg by mouth daily. Swallow whole.   Yes [provider]  calcium carbonate (OS-CAL) 1250 (500 Ca) MG chewable tablet Chew 1 tablet by mouth daily.   Yes [provider]  carvedilol (COREG) 12.5 MG tablet Take 6.25 mg by mouth daily. 07/29/22  Yes [provider]  cetirizine (ZYRTEC) 10 MG tablet Take 10 mg by mouth daily.   Yes [provider]  cholecalciferol (VITAMIN D3) 25 MCG (1000 UNIT) tablet Take 1,000 Units by mouth daily.  gummies   Yes [provider]  escitalopram (LEXAPRO) 5 MG tablet Take 5 mg by mouth daily. 12/22/22  Yes [provider]  fluticasone (FLONASE) 50 MCG/ACT nasal spray Place 1 spray into both nostrils every evening.   Yes [provider]  Multiple Vitamins-Minerals (HAIR SKIN AND NAILS FORMULA PO) Take 1 tablet by mouth daily.   Yes [provider]  pantoprazole (PROTONIX) 40 MG tablet Take 40 mg by mouth at bedtime. 12/29/22  Yes [provider]  rivastigmine (EXELON) 3 MG capsule Take 3 mg by mouth 2 (two) times daily. 11/30/22  Yes [provider]  rosuvastatin (CRESTOR) 40 MG tablet Take 40 mg by mouth at bedtime. 10/13/22  Yes [provider]  vitamin B-12 (CYANOCOBALAMIN) 100 MCG tablet Take 100 mcg by mouth daily. gummies   Yes [provider]      Allergies    Cephalexin    Review of Systems   Review of Systems  Unable to perform ROS: Dementia    Physical Exam Updated Vital Signs BP (!) 120/94   Pulse 91   Temp 97.6 F (36.4 C) (Axillary)   Resp 20   Ht 5\' 10"  (1.778 m)   Wt 77.1 kg   SpO2 95%   BMI 24.39 kg/m  Physical Exam Vitals and nursing note reviewed.  Constitutional:      Appearance:  He is well-developed.     Comments: GCS 13, AB intact, hypertensive on arrival  HENT:     Head: Normocephalic.     Comments: Large right occipital 6cm centimeter scalp laceration, not actively hemorrhaging Eyes:     Conjunctiva/sclera: Conjunctivae normal.  Neck:     Comments: C-collar in place, no midline tenderness to palpation of the cervical spine. Cardiovascular:     Rate and Rhythm: Normal rate and regular rhythm.  Pulmonary:     Effort: Pulmonary effort is normal. No respiratory distress.     Breath sounds: Normal breath sounds.  Chest:     Comments: Large chest wall hematoma noted on the right, significant chest wall tenderness Abdominal:     Palpations: Abdomen is soft.     Tenderness: There is  no abdominal tenderness.     Comments: Pelvis stable to lateral compression.  Musculoskeletal:     Cervical back: Neck supple.     Comments: Midline tenderness of the thoracic spine palpated, no step-offs.  No significant tenderness to the lumbar spine. Extremities atraumatic with intact ROM.   Skin:    General: Skin is warm and dry.  Neurological:     Mental Status: He is alert.     Comments: CN II-XII grossly intact. Moving all four extremities spontaneously and sensation grossly intact.     ED Results / Procedures / Treatments   Labs (all labs ordered are listed, but only abnormal results are displayed) Labs Reviewed  COMPREHENSIVE METABOLIC PANEL - Abnormal; Notable for the following components:      Result Value   CO2 18 (*)    Glucose, Bld 218 (*)    Creatinine, Ser 1.95 (*)    Calcium 8.5 (*)    Total Protein 4.9 (*)    Albumin 3.2 (*)    AST 69 (*)    Total Bilirubin 2.4 (*)    GFR, Estimated 35 (*)    All other components within normal limits  CBC - Abnormal; Notable for the following components:   WBC 35.3 (*)    RBC 3.61 (*)    Hemoglobin 10.8 (*)    HCT 32.2 (*)    All other components within normal limits  URINALYSIS, ROUTINE W REFLEX MICROSCOPIC - Abnormal; Notable for the following components:   APPearance HAZY (*)    Hgb urine dipstick MODERATE (*)    Protein, ur 30 (*)    Bacteria, UA RARE (*)    All other components within normal limits  LACTIC ACID, PLASMA - Abnormal; Notable for the following components:   Lactic Acid, Venous 7.6 (*)    All other components within normal limits  PROTIME-INR - Abnormal; Notable for the following components:   Prothrombin Time 19.9 (*)    INR 1.7 (*)    All other components within normal limits  CK - Abnormal; Notable for the following components:   Total CK 1,972 (*)    All other components within normal limits  I-STAT CHEM 8, ED - Abnormal; Notable for the following components:   BUN 28 (*)    Creatinine, Ser  1.80 (*)    Glucose, Bld 209 (*)    Calcium, Ion 1.07 (*)    TCO2 19 (*)    Hemoglobin 10.2 (*)    HCT 30.0 (*)    All other components within normal limits  CBG MONITORING, ED - Abnormal; Notable for the following components:   Glucose-Capillary 200 (*)    All other components within normal  limits  TROPONIN I (HIGH SENSITIVITY) - Abnormal; Notable for the following components:   Troponin I (High Sensitivity) 61 (*)    All other components within normal limits  TROPONIN I (HIGH SENSITIVITY) - Abnormal; Notable for the following components:   Troponin I (High Sensitivity) 143 (*)    All other components within normal limits  MRSA NEXT GEN BY PCR, NASAL  ETHANOL  CBC  BASIC METABOLIC PANEL  TYPE AND SCREEN  PREPARE RBC (CROSSMATCH)  ABO/RH    EKG EKG Interpretation  Date/Time:  Monday January 04 2023 14:15:45 EDT Ventricular Rate:  96 PR Interval:  154 QRS Duration: 79 QT Interval:  348 QTC Calculation: 440 R Axis:   12 Text Interpretation: Sinus rhythm Atrial premature complex Abnormal R-wave progression, early transition Confirmed by Ernie Avena ([id number]) on 01/04/2023 6:04:46 PM  Radiology CT CHEST ABDOMEN PELVIS W CONTRAST  Result Date: 01/04/2023 CLINICAL DATA:  Polytrauma, blunt. EXAM: CT CHEST, ABDOMEN, AND PELVIS WITH CONTRAST TECHNIQUE: Multidetector CT imaging of the chest, abdomen and pelvis was performed following the standard protocol during bolus administration of intravenous contrast. RADIATION DOSE REDUCTION: This exam was performed according to the departmental dose-optimization program which includes automated exposure control, adjustment of the mA and/or kV according to patient size and/or use of iterative reconstruction technique. CONTRAST:  75mL OMNIPAQUE IOHEXOL 350 MG/ML SOLN COMPARISON:  CTs of the chest, abdomen and pelvis dated 12/29/2019. FINDINGS: CT CHEST FINDINGS Cardiovascular: Status post median sternotomy and CABG. There is atherosclerosis of the  aorta, great vessels and coronary arteries. The heart size is normal. No significant pericardial fluid. There is no evidence of acute arterial injury. Mediastinum/Nodes: There is posterior mediastinal/paraspinal hemorrhage asymmetric to the right, likely related to underlying rib fractures. There is no hematoma within the anterior or middle mediastinum. No enlarged mediastinal, hilar or axillary lymph nodes are identified. No evidence of esophageal or tracheal bronchial injury. Lungs/Pleura: Small intermediate density right pleural effusion, likely reflecting hemothorax. No evidence of pneumothorax or left-sided pleural effusion. There is mild dependent atelectasis or contusion in the right lung. Musculoskeletal/Chest wall: Status post median sternotomy without evidence of acute sternal fracture. There are multiple posterolateral rib fractures on the right, involving the 3rd through 12th ribs. Some of these fractures involve the costovertebral junctions. In addition, there are multiple right-sided transverse process fractures in the thoracic spine. No definite thoracic vertebral body fracture. No definite left-sided rib fractures are seen. Asymmetric enlargement of the right pectoralis muscle with overlying subcutaneous contusion. There is also asymmetric enlargement of the erector spinae musculature on the right, likely due to hemorrhage. CT ABDOMEN AND PELVIS FINDINGS Hepatobiliary: No evidence of acute hepatic injury or focal liver lesion. No evidence of gallstones, gallbladder wall thickening or biliary dilatation. Pancreas: Unremarkable. No pancreatic ductal dilatation or surrounding inflammatory changes. Spleen: No evidence of acute splenic injury. The spleen is normal in size without focal abnormality. Adrenals/Urinary Tract: Both adrenal glands appear normal. No evidence of urinary tract calculus, suspicious renal lesion or hydronephrosis. The bladder appears normal for its degree of distention.  Stomach/Bowel: No enteric contrast administered. No evidence of bowel or mesenteric injury. Mildly prominent stool in the distal colon. Vascular/Lymphatic: Aortic and branch vessel atherosclerosis without evidence of aneurysm or large vessel occlusion. No evidence of acute vascular injury. A small amount of hemorrhage tracks along the right psoas muscle, attributed to the rib and transverse process fractures. No other significant retroperitoneal hemorrhage. Reproductive: The prostate gland and seminal vesicles appear unremarkable. Other: No evidence  of hemoperitoneum or pneumoperitoneum. The anterior abdominal wall appears intact. Musculoskeletal: Transitional lumbosacral anatomy with a partially lumbarized S1 segment. Mildly displaced acute fractures of the right L1 through L4 transverse processes. There is a mild superior endplate compression fracture involving the L4 vertebral body, asymmetric to the left. No significant loss of vertebral body height or osseous retropulsion. No evidence of acute pelvic or proximal femur fracture. As above, asymmetric enlargement of the retro spinae musculature, likely due to hemorrhage. A small amount of hemorrhage tracks along the right psoas musculature. Chronic calcification lateral to the left ischium is unchanged. IMPRESSION: 1. Multiple right-sided rib fractures involving the 3rd through 12th ribs. Some of these fractures involve the costovertebral junctions. 2. Multiple right-sided transverse process fractures in the thoracic and spine. Mild superior endplate compression fracture of the L4 vertebral body without significant loss of vertebral body height or osseous retropulsion. 3. Small right hemothorax with mild dependent atelectasis or contusion in the right lung. No evidence of pneumothorax. 4. Asymmetric enlargement of the right pectoralis and erector spinae musculature, likely due to hemorrhage. There is overlying subcutaneous contusion in the right chest wall. 5. No  evidence of acute intra-abdominal or pelvic injury. 6.  Aortic Atherosclerosis (ICD10-I70.0). 7. These results were called by telephone at the time of interpretation on 01/04/2023 at 3:00 pm to Dr Kris Mouton, who verbally acknowledged these results. Electronically Signed   By: Carey Bullocks M.D.   On: 01/04/2023 15:29   DG Pelvis Portable  Result Date: 01/04/2023 CLINICAL DATA:  Fall into an empty pool. EXAM: PORTABLE PELVIS 1 VIEWS COMPARISON:  None Available. FINDINGS: There is no evidence of pelvic fracture or diastasis. No pelvic bone lesions are seen. IMPRESSION: No acute fracture or dislocation. Electronically Signed   By: Agustin Cree M.D.   On: 01/04/2023 15:27   DG Chest Port 1 View  Result Date: 01/04/2023 CLINICAL DATA:  Trauma.  Fall into an empty pool. EXAM: PORTABLE CHEST 1 VIEW COMPARISON:  Chest radiograph dated 12/28/2019 FINDINGS: Normal lung volumes. Asymmetric opacification of the right lung. Small right pleural effusion. No pneumothorax. The heart size and mediastinal contours are within normal limits. Multiple displaced right rib fractures. Median sternotomy wires are nondisplaced. IMPRESSION: 1. Asymmetric opacification of the right lung, likely due to a combination of atelectasis and pleural effusion. 2. Multiple displaced right rib fractures. Electronically Signed   By: Agustin Cree M.D.   On: 01/04/2023 15:24   CT HEAD WO CONTRAST  Result Date: 01/04/2023 CLINICAL DATA:  Head trauma, moderate-severe; Polytrauma, blunt EXAM: CT HEAD WITHOUT CONTRAST CT CERVICAL SPINE WITHOUT CONTRAST TECHNIQUE: Multidetector CT imaging of the head and cervical spine was performed following the standard protocol without intravenous contrast. Multiplanar CT image reconstructions of the cervical spine were also generated. RADIATION DOSE REDUCTION: This exam was performed according to the departmental dose-optimization program which includes automated exposure control, adjustment of the mA and/or kV  according to patient size and/or use of iterative reconstruction technique. COMPARISON:  None Available. FINDINGS: CT HEAD FINDINGS Brain: There is a 5 mm left cerebral convexity subdural hematoma. There is small volume subarachnoid hemorrhage along the anterior lateral left temporal lobe no significant midline shift. No hydrocephalus. No CT evidence of a parenchymal hematoma. No CT evidence of an acute cortical infarct. 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 Vascular: No hyperdense vessel or unexpected calcification. Skull: Soft tissue hematoma and laceration along the right  parietal scalp. No evidence underlying calvarial fracture. Sinuses/Orbits: Mastoid or middle ear. Paranasal sinuses are notable for polypoid mucosal thickening in the right maxillary sinus. There are likely postsurgical changes from bilateral maxillary antrostomies and right inferior ethmoidectomy. Orbits are unremarkable. Other: None. CT CERVICAL SPINE FINDINGS Limitations: Assessment of the cervical spine is limited due to the degree of motion artifact. Alignment: Normal. Skull base and vertebrae: No acute fracture. No primary bone lesion or focal pathologic process. Soft tissues and spinal canal: No prevertebral fluid or swelling. No visible canal hematoma. There is soft tissue stranding around the lateral right neck with asymmetric enlargement of the right sternocleidomastoid muscle body, which could represent and intramuscular hematoma. Disc levels:  No CT evidence of high-grade spinal canal stenosis. Upper chest: There is a mildly displaced fracture of posterior right second rib (series 4, image 56), right third rib (series 4, image 68) and right fourth rib (series 4, image 70). There is likely also a mildly displaced fracture of the right T4 transverse process (series 4, image 82). There is a displaced fracture of the medial right  clavicle with a large surrounding soft tissue hematoma. There is also a mildly displaced fracture of the right scapula. Other: Non IMPRESSION: CT HEAD: 1. Acute 5 mm left cerebral convexity subdural hematoma. No significant midline shift. 2. Small volume subarachnoid hemorrhage along the anterior lateral left temporal lobe. No evidence of intraventricular extension. 3. Soft tissue hematoma and laceration along the right parietal scalp. No evidence of underlying calvarial fracture. CT C SPINE: Assessment of the cervical spine is limited due to the degree of motion artifact. Within this limitation, no evidence of acute fracture or traumatic listhesis. 1. Mildly displaced fractures of the posterior right second, third, and fourth ribs. 2. Mildly displaced fracture of the right T4 transverse process. 3. Displaced fracture of the medial right clavicle with a large surrounding soft tissue hematoma. 4. Mildly displaced fracture of the right scapula. 5. Soft tissue stranding around the lateral right neck with an intramuscular hematoma in the right SCM. See same day CT Chest, Abdomen, Pelvis for additional findings below the thoracic inlet. Electronically Signed   By: Lorenza Cambridge M.D.   On: 01/04/2023 15:11   CT CERVICAL SPINE WO CONTRAST  Result Date: 01/04/2023 CLINICAL DATA:  Head trauma, moderate-severe; Polytrauma, blunt EXAM: CT HEAD WITHOUT CONTRAST CT CERVICAL SPINE WITHOUT CONTRAST TECHNIQUE: Multidetector CT imaging of the head and cervical spine was performed following the standard protocol without intravenous contrast. Multiplanar CT image reconstructions of the cervical spine were also generated. RADIATION DOSE REDUCTION: This exam was performed according to the departmental dose-optimization program which includes automated exposure control, adjustment of the mA and/or kV according to patient size and/or use of iterative reconstruction technique. COMPARISON:  None Available. FINDINGS: CT HEAD FINDINGS  Brain: There is a 5 mm left cerebral convexity subdural hematoma. There is small volume subarachnoid hemorrhage along the anterior lateral left temporal lobe no significant midline shift. No hydrocephalus. No CT evidence of a parenchymal hematoma. No CT evidence of an acute cortical infarct. 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 Vascular: No hyperdense vessel or unexpected calcification. Skull: Soft tissue hematoma and laceration along the right parietal scalp. No evidence underlying calvarial fracture. Sinuses/Orbits: Mastoid or middle ear. Paranasal sinuses are notable for polypoid mucosal thickening in the right maxillary sinus. There are likely postsurgical changes from bilateral maxillary antrostomies and right inferior ethmoidectomy.  Orbits are unremarkable. Other: None. CT CERVICAL SPINE FINDINGS Limitations: Assessment of the cervical spine is limited due to the degree of motion artifact. Alignment: Normal. Skull base and vertebrae: No acute fracture. No primary bone lesion or focal pathologic process. Soft tissues and spinal canal: No prevertebral fluid or swelling. No visible canal hematoma. There is soft tissue stranding around the lateral right neck with asymmetric enlargement of the right sternocleidomastoid muscle body, which could represent and intramuscular hematoma. Disc levels:  No CT evidence of high-grade spinal canal stenosis. Upper chest: There is a mildly displaced fracture of posterior right second rib (series 4, image 56), right third rib (series 4, image 68) and right fourth rib (series 4, image 70). There is likely also a mildly displaced fracture of the right T4 transverse process (series 4, image 82). There is a displaced fracture of the medial right clavicle with a large surrounding soft tissue hematoma. There is also a mildly displaced fracture of the right scapula. Other: Non  IMPRESSION: CT HEAD: 1. Acute 5 mm left cerebral convexity subdural hematoma. No significant midline shift. 2. Small volume subarachnoid hemorrhage along the anterior lateral left temporal lobe. No evidence of intraventricular extension. 3. Soft tissue hematoma and laceration along the right parietal scalp. No evidence of underlying calvarial fracture. CT C SPINE: Assessment of the cervical spine is limited due to the degree of motion artifact. Within this limitation, no evidence of acute fracture or traumatic listhesis. 1. Mildly displaced fractures of the posterior right second, third, and fourth ribs. 2. Mildly displaced fracture of the right T4 transverse process. 3. Displaced fracture of the medial right clavicle with a large surrounding soft tissue hematoma. 4. Mildly displaced fracture of the right scapula. 5. Soft tissue stranding around the lateral right neck with an intramuscular hematoma in the right SCM. See same day CT Chest, Abdomen, Pelvis for additional findings below the thoracic inlet. Electronically Signed   By: Lorenza Cambridge M.D.   On: 01/04/2023 15:11    Procedures .Critical Care  Performed by: Ernie Avena, MD Authorized by: Ernie Avena, MD   Critical care provider statement:    Critical care time (minutes):  30   Critical care was necessary to treat or prevent imminent or life-threatening deterioration of the following conditions:  Trauma   Critical care was time spent personally by me on the following activities:  Development of treatment plan with patient or surrogate, discussions with consultants, evaluation of patient's response to treatment, examination of patient, ordering and review of laboratory studies, ordering and review of radiographic studies, ordering and performing treatments and interventions, pulse oximetry, re-evaluation of patient's condition and review of old charts   Care discussed with: admitting provider   Ultrasound ED FAST  Date/Time: 01/04/2023 2:30  PM  Performed by: Ernie Avena, MD Authorized by: Ernie Avena, MD  Procedure details:    Indications: blunt chest trauma        Technique:  Abdominal and cardiac    Images: not archived      Abdominal findings:    L kidney:  Visualized   R kidney:  Visualized   Liver:  Visualized    Bladder:  Visualized   Hepatorenal space visualized: identified     Splenorenal space: identified     Rectovesical free fluid: not identified     Splenorenal free fluid: not identified     Hepatorenal space free fluid: not identified   Cardiac findings:    Heart:  Visualized   Wall motion:  identified     Pericardial effusion: not identified       Medications Ordered in ED Medications  0.9 %  sodium chloride infusion (Manually program via Guardrails IV Fluids) ( Intravenous Not Given 01/04/23 1459)  acetaminophen (TYLENOL) tablet 1,000 mg (has no administration in time range)  docusate sodium (COLACE) capsule 100 mg (100 mg Oral Patient Refused/Not Given 01/04/23 1700)  polyethylene glycol (MIRALAX / GLYCOLAX) packet 17 g (has no administration in time range)  ondansetron (ZOFRAN-ODT) disintegrating tablet 4 mg (has no administration in time range)    Or  ondansetron (ZOFRAN) injection 4 mg (has no administration in time range)  metoprolol tartrate (LOPRESSOR) injection 5 mg (has no administration in time range)  hydrALAZINE (APRESOLINE) injection 10 mg (has no administration in time range)  0.9 %  sodium chloride infusion (has no administration in time range)  levETIRAcetam (KEPPRA) IVPB 500 mg/100 mL premix (0 mg Intravenous Stopped 01/04/23 1621)  lidocaine (LIDODERM) 5 % 2 patch (has no administration in time range)  traMADol (ULTRAM) tablet 50 mg (has no administration in time range)  fentaNYL (SUBLIMAZE) injection 12.5-25 mcg (25 mcg Intravenous Given 01/04/23 1804)  Tdap (BOOSTRIX) injection 0.5 mL (0.5 mLs Intramuscular Given 01/04/23 1423)  iohexol (OMNIPAQUE) 350 MG/ML injection 75 mL  (75 mLs Intravenous Contrast Given 01/04/23 1448)  ceFAZolin (ANCEF) IVPB 2g/100 mL premix (0 g Intravenous Stopped 01/04/23 1634)    ED Course/ Medical Decision Making/ A&P Clinical Course as of 01/04/23 1813  Mon Jan 04, 2023  1432 BP(!): 82/65 [JL]    Clinical Course User Index [JL] Ernie Avena, MD                             Medical Decision Making Amount and/or Complexity of Data Reviewed Labs: ordered. Radiology: ordered.  Risk Prescription drug management. Decision regarding hospitalization.    77 year old male with medical history significant for dementia who presents to the emergency department after an unwitnessed fall.  The history is provided by EMS as the patient's dementia limited the history.  He was reportedly last seen at 2000 last night by his wife.  He was found after seeming to have fallen into an empty pool.  He was found by neighbors while yelling at the bottom of the pool.  The patient received a 500 cc bolus with EMS and was 96/50 with a heart rate in the 120s.  Initially activated as a level 2 trauma.  The patient was found to have significant bleeding on scene and sustained an obvious scalp laceration.  He arrived airway and breathing intact.  Initially was hypotensive BP 82/65 and he was upgraded to a level 1 trauma and administered 1 unit PRBCs in addition to an IV fluid bolus of 1 L.  The patient was hypotensive on arrival and rapidly improved following blood product and fluid resuscitation.  Trauma surgery was bedside after arrival as the patient initially presented as a level 2 trauma, upgraded subsequently on arrival due to hypotension.  Physical exam concerning for evidence of head trauma.  No known anticoagulation.  Unclear last tetanus.  The patient was administered tetanus and Ancef in the ER.  Initial EKG revealed sinus rhythm, ventricular rate 96, no evidence of STEMI.  Trauma imaging revealed (full reports in EMR): Portable CXR:  No evidence of  pneumothorax or tracheal deviation IMPRESSION:  1. Asymmetric opacification of the right lung, likely due to a  combination of atelectasis and  pleural effusion.  2. Multiple displaced right rib fractures.   Portable Pelvis:  No evidence of acute hip fracture or malalignment FAST:  Performed and negative CT scans (pan-scan):  IMPRESSION:  CT HEAD:    1. Acute 5 mm left cerebral convexity subdural hematoma. No  significant midline shift.  2. Small volume subarachnoid hemorrhage along the anterior lateral  left temporal lobe. No evidence of intraventricular extension.  3. Soft tissue hematoma and laceration along the right parietal  scalp. No evidence of underlying calvarial fracture.    CT C SPINE:    Assessment of the cervical spine is limited due to the degree of  motion artifact. Within this limitation, no evidence of acute  fracture or traumatic listhesis.    1. Mildly displaced fractures of the posterior right second, third,  and fourth ribs.  2. Mildly displaced fracture of the right T4 transverse process.  3. Displaced fracture of the medial right clavicle with a large  surrounding soft tissue hematoma.  4. Mildly displaced fracture of the right scapula.  5. Soft tissue stranding around the lateral right neck with an  intramuscular hematoma in the right SCM.    See same day CT Chest, Abdomen, Pelvis for additional findings below  the thoracic inlet.    CT chest abdomen pelvis: IMPRESSION:  1. Multiple right-sided rib fractures involving the 3rd through 12th  ribs. Some of these fractures involve the costovertebral junctions.  2. Multiple right-sided transverse process fractures in the thoracic  and spine. Mild superior endplate compression fracture of the L4  vertebral body without significant loss of vertebral body height or  osseous retropulsion.  3. Small right hemothorax with mild dependent atelectasis or  contusion in the right lung. No evidence of  pneumothorax.  4. Asymmetric enlargement of the right pectoralis and erector spinae  musculature, likely due to hemorrhage. There is overlying  subcutaneous contusion in the right chest wall.  5. No evidence of acute intra-abdominal or pelvic injury.  6.  Aortic Atherosclerosis (ICD10-I70.0).  7. These results were called by telephone at the time of  interpretation on 01/04/2023 at 3:00 pm to Dr Kris Mouton, who  verbally acknowledged these results.     Labs concerning for an elevated CK to 1972, elevated troponin to 61, CMP with hyperglycemia to 218, AKI to 195, CBC with leukocytosis to 35, hemoglobin 10.8.  Lactic acid was elevated to 7.6.  The patient received an IV fluid bolus, tetanus, Ancef, 1 unit PRBCs while in the ED.  The patient will be admitted to the ICU under the Trauma Service for further evaluation and monitoring.    Final Clinical Impression(s) / ED Diagnoses Final diagnoses:  Fall, initial encounter  Trauma  Closed fracture of multiple ribs, unspecified laterality, initial encounter  Laceration of scalp, initial encounter  Traumatic brain injury, with unknown loss of consciousness status, initial encounter (HCC)  Other closed fracture of thoracic vertebra, unspecified thoracic vertebral level, initial encounter (HCC)  Closed fracture of scapula, unspecified laterality, unspecified part of scapula, initial encounter    Rx / DC Orders ED Discharge Orders     None         Ernie Avena, MD 01/04/23 1813

## 2023-01-04 NOTE — H&P (Signed)
TIELER MARKHAM 1945/12/07  [id number].    Requesting MD: Karene Fry, MD Chief Complaint/Reason for Consult: fall, hypotension, scalp lac  HPI:  Omar Kennedy. Hornbaker is a 77 y/o M with a PMH dementia who presented to the ED as a level 2 trauma after he was found down in an empty swimming pool behind his house. Last known normal was yesterday evening 2000. His wife could not find him this morning, called her neighbors, and they found him in the pool asking for help. Upgraded to level 1 due to hypotension. He is talking in the trauma bay but is only oriented to self. He screams in pain with movement. He has an obvious scalp laceration and right anterior chest wall contusion. Systolic pressure came up to >100 mmhg after 1 u pRBC and he was taken to the CT scanner.  ROS: Review of Systems  Unable to perform ROS: Dementia    No family history on file.  History reviewed. No pertinent past medical history.  History reviewed. No pertinent surgical history.  Social History:  has no history on file for tobacco use, alcohol use, and drug use.  Allergies: Not on File  (Not in a hospital admission)    Physical Exam: Blood pressure (!) 101/57, pulse 95, temperature 97.6 F (36.4 C), temperature source Axillary, resp. rate (!) 23, SpO2 99 %. General: confused white elderly male HEENT: head traumatic. R parietal scalp lac. Pupils equal and anicteric. Mouth dry Neck- Trachea is midline, c-collar in place  CV- RRR, normal S1/S2, no M/R/G, no lower extremity edema  Pulm- R anterior chest wall with large hematoma that is fluctuant and soft. breathing is non-labored, CTABL, no wheezes, rhales, rhonchi. Abd- soft, NT/ND, no hernias or masses, no peritonitis  MSK: there is tenderness of the thoracic spine  GU- normal male anatomy   MSK- UE/LE symmetrical, no cyanosis, clubbing, or edema. Neuro- non focal exam. Moving all extremities.  Psych- Alert and Oriented to self only  Skin: warm and  dry   Results for orders placed or performed during the hospital encounter of 01/04/23 (from the past 48 hour(s))  Type and screen Ordered by PROVIDER DEFAULT     Status: None (Preliminary result)   Collection Time: 01/04/23  2:18 PM  Result Value Ref Range   ABO/RH(D) PENDING    Antibody Screen PENDING    Sample Expiration 01/07/2023,2359    Unit Number [id number]    Blood Component Type RED CELLS,LR    Unit division 00    Status of Unit ISSUED    Unit tag comment VERBAL ORDERS PER DR LAWSING    Transfusion Status      OK TO TRANSFUSE Performed at [facility], [address], [place], [place] [address]    Crossmatch Result PENDING   CBG monitoring, ED     Status: Abnormal   Collection Time: 01/04/23  2:19 PM  Result Value Ref Range   Glucose-Capillary 200 (H) 70 - 99 mg/dL    Comment: Glucose reference range applies only to samples taken after fasting for at least 8 hours.   No results found.    Assessment/Plan Found down in a swimming pool  R scalp laceration - closed in ED with 9 staples on 6/17 T11 fracture, multiple thoracic and lumbar spine transverse process fractures - per NS c/s SAH/SDH - NS c/s Dr. Yetta Barre, start IV keppra x 7 days, admit to ICU for neuro checks R chest wall hematoma  R  clavicle fracture - ortho c/s L scapula fracture - ortho c/s  R HTX, R Fib FX 5-6 - no PTX on CT. Multimodal pain control, IS, CXR in AM  ABL anemia - s/p 1 u RBC 6/17, trend H&H   FEN - NPO, IVF  VTE - SCD's, hold chemical VTE due to bleeding ID - Tdap given in ED Admit - trauma service, ICU  Neurosurgery and orthopedic surgery consults pending  Final CT reads pending  Merge chart and med review pending  Total critical care time: 65 minutes   I reviewed nursing notes, ED provider notes, last 24 h vitals and pain scores, last 48 h intake and output, last 24 h labs and trends, and last 24 h imaging results.  Adam Phenix, [facility]  Surgery 01/04/2023, 2:36 PM Please see Amion for pager number during day hours 7:00am-4:30pm or 7:00am -11:30am on weekends

## 2023-01-04 NOTE — Consult Note (Signed)
Reason for Consult:Polytrauma Referring Physician: Angelena Form Time called: 1412 Time at bedside: 1412   Omar Kennedy is an 77 y.o. male.  HPI: Omar Kennedy was found at the bottom of a drained pool today. He had last been seen at 2000 last night. He was brought in as a level 2 trauma activation. Workup showed right clavicle and left scapula fxs and orthopedic surgery was consulted. He is confused and cannot contribute to history.  History reviewed. No pertinent past medical history.  History reviewed. No pertinent surgical history.  No family history on file.  Social History:  has no history on file for tobacco use, alcohol use, and drug use.  Allergies: Not on File  Medications: I have reviewed the patient's current medications.  Results for orders placed or performed during the hospital encounter of 01/04/23 (from the past 48 hour(s))  CBC     Status: Abnormal   Collection Time: 01/04/23  2:12 PM  Result Value Ref Range   WBC 35.3 (H) 4.0 - 10.5 K/uL   RBC 3.61 (L) 4.22 - 5.81 MIL/uL   Hemoglobin 10.8 (L) 13.0 - 17.0 g/dL   HCT 16.1 (L) 09.6 - 04.5 %   MCV 89.2 80.0 - 100.0 fL   MCH 29.9 26.0 - 34.0 pg   MCHC 33.5 30.0 - 36.0 g/dL   RDW 40.9 81.1 - 91.4 %   Platelets 179 150 - 400 K/uL   nRBC 0.1 0.0 - 0.2 %    Comment: Performed at [facility], [address], [place], [place] [address]  Protime-INR     Status: Abnormal   Collection Time: 01/04/23  2:12 PM  Result Value Ref Range   Prothrombin Time 19.9 (H) 11.4 - 15.2 seconds   INR 1.7 (H) 0.8 - 1.2    Comment: (NOTE) INR goal varies based on device and disease states. Performed at [facility], [address], [place], [place] [address]   CBG monitoring, ED     Status: Abnormal   Collection Time: 01/04/23  2:19 PM  Result Value Ref Range   Glucose-Capillary 200 (H) 70 - 99 mg/dL    Comment: Glucose reference range applies only to samples taken after fasting for at least 8 hours.  Type and screen Ordered  by PROVIDER DEFAULT     Status: None (Preliminary result)   Collection Time: 01/04/23  2:30 PM  Result Value Ref Range   ABO/RH(D) O NEG    Antibody Screen NEG    Sample Expiration 01/07/2023,2359    Unit Number [id number]    Blood Component Type RED CELLS,LR    Unit division 00    Status of Unit ISSUED    Unit tag comment VERBAL ORDERS PER DR LAWSING    Transfusion Status OK TO TRANSFUSE    Crossmatch Result COMPATIBLE   I-Stat Chem 8, ED     Status: Abnormal   Collection Time: 01/04/23  2:36 PM  Result Value Ref Range   Sodium 142 135 - 145 mmol/L   Potassium 4.2 3.5 - 5.1 mmol/L   Chloride 110 98 - 111 mmol/L   BUN 28 (H) 8 - 23 mg/dL   Creatinine, Ser 8.41 (H) 0.61 - 1.24 mg/dL   Glucose, Bld 324 (H) 70 - 99 mg/dL    Comment: Glucose reference range applies only to samples taken after fasting for at least 8 hours.   Calcium, Ion 1.07 (L) 1.15 - 1.40 mmol/L   TCO2 19 (L) 22 -  32 mmol/L   Hemoglobin 10.2 (L) 13.0 - 17.0 g/dL   HCT 16.1 (L) 09.6 - 04.5 %  Prepare RBC     Status: None   Collection Time: 01/04/23  2:57 PM  Result Value Ref Range   Order Confirmation      ORDER PROCESSED BY BLOOD BANK Performed at [facility], [address] [place], [place] [address]     No results found.  Review of Systems  Unable to perform ROS: Mental status change   Blood pressure (!) 146/64, pulse 88, temperature 97.6 F (36.4 C), temperature source Axillary, resp. rate 19, height 5\' 10"  (1.778 m), weight 77.1 kg, SpO2 97 %. Physical Exam Constitutional:      General: He is not in acute distress.    Appearance: He is well-developed. He is not diaphoretic.  HENT:     Head: Normocephalic and atraumatic.  Eyes:     General: No scleral icterus.       Right eye: No discharge.        Left eye: No discharge.     Conjunctiva/sclera: Conjunctivae normal.  Neck:     Comments: C-collar Cardiovascular:     Rate and Rhythm: Normal rate and regular rhythm.  Pulmonary:      Effort: Pulmonary effort is normal. No respiratory distress.  Musculoskeletal:     Comments: Right shoulder, elbow, wrist, digits- no skin wounds, extensive ecchymosis ant chest/clavicle, no instability, no blocks to motion  Sens  Ax/R/M/U could not assess  Mot   Ax/ R/ PIN/ M/ AIN/ U could not assess  Rad 2+  Skin:    General: Skin is warm and dry.  Neurological:     Mental Status: He is alert.  Psychiatric:     Comments: Confused     Assessment/Plan: Right clavicle fx -- Plan sling and NWB for now. Left scapula fx -- Plan non-operative management with WBAT.    Freeman Caldron, PA-C Orthopedic Surgery [phone] 01/04/2023, 3:40 PM

## 2023-01-04 NOTE — ED Notes (Signed)
Taken to CT by Clydie Braun, trauma RN, and trauma team at this time.

## 2023-01-04 NOTE — Progress Notes (Signed)
   01/04/23 1433  Spiritual Encounters  Type of Visit Initial  Care provided to: Patient  Conversation partners present during encounter Nurse  Referral source Trauma page  Reason for visit Trauma  OnCall Visit No   Chaplain responded to Trauma 2 that was upgraded to Trauma 1. PT unavailable as medical team provided care.  No support persona present; clinical staff working to find next of kin.   Chaplain services remain available by paging spiritual care.

## 2023-01-04 NOTE — ED Triage Notes (Signed)
Last seen at 2000 last night by wife. Hx of dementia. Fell into an empty pool. Found by neighbors while yelling at the bottom of the pool. EMS gave 500cc bolus NS. Initial bp was 96/50 with HR 120s. Level 2 trauma. Significant bruising to chest and laceration to back of head.

## 2023-01-04 NOTE — Progress Notes (Signed)
Pt daugther wants to speak with a doctor .  From report MD will speak with her tommorow. I notified oncall MD (Kinsinger)who said he will defer this to rounding doctors in the morning.

## 2023-01-04 NOTE — ED Notes (Signed)
Pt brought in by [facility] after sustaining a fall. Found by neighbors while pt was yelling at the bottom end of an empty pool. Last seen by wife at 2000 last night. Large head laceration at the right back of head, staples placed by trauma PA. Pt also has severe bruising to chest. Initial bp by EMS was 96/50 with HR 120. EMS gave 500cc bolus en route. Pt is alert, talking but noted with confusion. Hx of dementia. Came in as a Level 2 trauma but upgraded to level 1 trauma at 1417. Cardiac monitoring in place. Will continue to monitor.

## 2023-01-04 NOTE — TOC CAGE-AID Note (Signed)
Transition of Care [facility]) - CAGE-AID Screening   Patient Details  Name: Omar Kennedy MRN: [id number] Date of Birth: 1946/04/23  [facility] CM/SW Contact:    Leota Sauers, RN Phone Number: 01/04/2023, 8:10 PM   Clinical Narrative:  Screening unable to be completed d/t patient hx of dementia and confused at baseline.  CAGE-AID Screening: Substance Abuse Screening unable to be completed due to: : Patient unable to participate (D/T dementia)

## 2023-01-04 NOTE — Consult Note (Signed)
Reason for Consult: Post head injury and spine fractures Referring Physician: EDP  CHAU Kennedy is an 77 y.o. male.   HPI:  Every 25-year-old with history of dementia who was found after an apparent fall.  His right arm is in a sling.  He denied headache.  Head CT showed old left subdural hematoma and some minor traumatic subarachnoid blood, thoracic and lumbar imaging showed spinal fractures and neurosurgical evaluation was requested.  The patient cannot really cooperate with history and physical very well  History reviewed. No pertinent past medical history.  History reviewed. No pertinent surgical history.  Not on File  Social History   Tobacco Use   Smoking status: Not on file   Smokeless tobacco: Not on file  Substance Use Topics   Alcohol use: Not on file    History reviewed. No pertinent family history.   Review of Systems  Positive ROS: UnAble to obtain reliable answers  All other systems have been reviewed and were otherwise negative with the exception of those mentioned in the HPI and as above.  Objective: Vital signs in last 24 hours: Temp:  [97.6 F (36.4 C)] 97.6 F (36.4 C) (06/17 1414) Pulse Rate:  [78-95] 78 (06/17 1600) Resp:  [16-26] 24 (06/17 1600) BP: (82-146)/(56-70) 143/70 (06/17 1600) SpO2:  [94 %-99 %] 97 % (06/17 1600) Weight:  [77.1 kg] 77.1 kg (06/17 1458)  General Appearance: Alert, cooperative, no distress, appears stated age Head: Normocephalic,stapled laceration right parietal Eyes: PERRL, conjunctiva/corneas clear, EOM's intact Neck: Supple Lungs: respirations unlabored Heart: Regular rate and rhythm Abdomen: Soft Extremities: Right arm in a sling  NEUROLOGIC:   Mental status: A awake and conversant but unable to give reliable answers Motor Exam - grossly normal, normal tone and bulk as best we can tell Sensory Exam - grossly normal as best we can tell Reflexes: symmetric, no pathologic reflexes, No Hoffman's, No  clonus Coordination - grossly normal Gait -unable to test Balance -unable to test Cranial Nerves: I: smell Not tested  II: visual acuity  OS: na    OD: na  II: visual fields Full to confrontation  II: pupils Equal, round, reactive to light  III,VII: ptosis None  III,IV,VI: extraocular muscles  Full ROM  V: mastication Normal  V: facial light touch sensation  Normal  V,VII: corneal reflex  Present  VII: facial muscle function - upper  Normal  VII: facial muscle function - lower Normal  VIII: hearing Not tested  IX: soft palate elevation  Normal  IX,X: gag reflex Present  XI: trapezius strength  5/5  XI: sternocleidomastoid strength 5/5  XI: neck flexion strength  5/5  XII: tongue strength  Normal    Data Review Lab Results  Component Value Date   WBC 35.3 (H) 01/04/2023   HGB 10.2 (L) 01/04/2023   HCT 30.0 (L) 01/04/2023   MCV 89.2 01/04/2023   PLT 179 01/04/2023   Lab Results  Component Value Date   NA 142 01/04/2023   K 4.2 01/04/2023   CL 110 01/04/2023   CO2 18 (L) 01/04/2023   BUN 28 (H) 01/04/2023   CREATININE 1.80 (H) 01/04/2023   GLUCOSE 209 (H) 01/04/2023   Lab Results  Component Value Date   INR 1.7 (H) 01/04/2023    Radiology: CT CHEST ABDOMEN PELVIS W CONTRAST  Result Date: 01/04/2023 CLINICAL DATA:  Polytrauma, blunt. EXAM: CT CHEST, ABDOMEN, AND PELVIS WITH CONTRAST TECHNIQUE: Multidetector CT imaging of the chest, abdomen and pelvis was performed following  the standard protocol during bolus administration of intravenous contrast. RADIATION DOSE REDUCTION: This exam was performed according to the departmental dose-optimization program which includes automated exposure control, adjustment of the mA and/or kV according to patient size and/or use of iterative reconstruction technique. CONTRAST:  75mL OMNIPAQUE IOHEXOL 350 MG/ML SOLN COMPARISON:  CTs of the chest, abdomen and pelvis dated 12/29/2019. FINDINGS: CT CHEST FINDINGS Cardiovascular: Status post  median sternotomy and CABG. There is atherosclerosis of the aorta, great vessels and coronary arteries. The heart size is normal. No significant pericardial fluid. There is no evidence of acute arterial injury. Mediastinum/Nodes: There is posterior mediastinal/paraspinal hemorrhage asymmetric to the right, likely related to underlying rib fractures. There is no hematoma within the anterior or middle mediastinum. No enlarged mediastinal, hilar or axillary lymph nodes are identified. No evidence of esophageal or tracheal bronchial injury. Lungs/Pleura: Small intermediate density right pleural effusion, likely reflecting hemothorax. No evidence of pneumothorax or left-sided pleural effusion. There is mild dependent atelectasis or contusion in the right lung. Musculoskeletal/Chest wall: Status post median sternotomy without evidence of acute sternal fracture. There are multiple posterolateral rib fractures on the right, involving the 3rd through 12th ribs. Some of these fractures involve the costovertebral junctions. In addition, there are multiple right-sided transverse process fractures in the thoracic spine. No definite thoracic vertebral body fracture. No definite left-sided rib fractures are seen. Asymmetric enlargement of the right pectoralis muscle with overlying subcutaneous contusion. There is also asymmetric enlargement of the erector spinae musculature on the right, likely due to hemorrhage. CT ABDOMEN AND PELVIS FINDINGS Hepatobiliary: No evidence of acute hepatic injury or focal liver lesion. No evidence of gallstones, gallbladder wall thickening or biliary dilatation. Pancreas: Unremarkable. No pancreatic ductal dilatation or surrounding inflammatory changes. Spleen: No evidence of acute splenic injury. The spleen is normal in size without focal abnormality. Adrenals/Urinary Tract: Both adrenal glands appear normal. No evidence of urinary tract calculus, suspicious renal lesion or hydronephrosis. The  bladder appears normal for its degree of distention. Stomach/Bowel: No enteric contrast administered. No evidence of bowel or mesenteric injury. Mildly prominent stool in the distal colon. Vascular/Lymphatic: Aortic and branch vessel atherosclerosis without evidence of aneurysm or large vessel occlusion. No evidence of acute vascular injury. A small amount of hemorrhage tracks along the right psoas muscle, attributed to the rib and transverse process fractures. No other significant retroperitoneal hemorrhage. Reproductive: The prostate gland and seminal vesicles appear unremarkable. Other: No evidence of hemoperitoneum or pneumoperitoneum. The anterior abdominal wall appears intact. Musculoskeletal: Transitional lumbosacral anatomy with a partially lumbarized S1 segment. Mildly displaced acute fractures of the right L1 through L4 transverse processes. There is a mild superior endplate compression fracture involving the L4 vertebral body, asymmetric to the left. No significant loss of vertebral body height or osseous retropulsion. No evidence of acute pelvic or proximal femur fracture. As above, asymmetric enlargement of the retro spinae musculature, likely due to hemorrhage. A small amount of hemorrhage tracks along the right psoas musculature. Chronic calcification lateral to the left ischium is unchanged. IMPRESSION: 1. Multiple right-sided rib fractures involving the 3rd through 12th ribs. Some of these fractures involve the costovertebral junctions. 2. Multiple right-sided transverse process fractures in the thoracic and spine. Mild superior endplate compression fracture of the L4 vertebral body without significant loss of vertebral body height or osseous retropulsion. 3. Small right hemothorax with mild dependent atelectasis or contusion in the right lung. No evidence of pneumothorax. 4. Asymmetric enlargement of the right pectoralis and erector spinae musculature,  likely due to hemorrhage. There is overlying  subcutaneous contusion in the right chest wall. 5. No evidence of acute intra-abdominal or pelvic injury. 6.  Aortic Atherosclerosis (ICD10-I70.0). 7. These results were called by telephone at the time of interpretation on 01/04/2023 at 3:00 pm to Dr Kris Mouton, who verbally acknowledged these results. Electronically Signed   By: Carey Bullocks M.D.   On: 01/04/2023 15:29   DG Pelvis Portable  Result Date: 01/04/2023 CLINICAL DATA:  Fall into an empty pool. EXAM: PORTABLE PELVIS 1 VIEWS COMPARISON:  None Available. FINDINGS: There is no evidence of pelvic fracture or diastasis. No pelvic bone lesions are seen. IMPRESSION: No acute fracture or dislocation. Electronically Signed   By: Agustin Cree M.D.   On: 01/04/2023 15:27   DG Chest Port 1 View  Result Date: 01/04/2023 CLINICAL DATA:  Trauma.  Fall into an empty pool. EXAM: PORTABLE CHEST 1 VIEW COMPARISON:  Chest radiograph dated 12/28/2019 FINDINGS: Normal lung volumes. Asymmetric opacification of the right lung. Small right pleural effusion. No pneumothorax. The heart size and mediastinal contours are within normal limits. Multiple displaced right rib fractures. Median sternotomy wires are nondisplaced. IMPRESSION: 1. Asymmetric opacification of the right lung, likely due to a combination of atelectasis and pleural effusion. 2. Multiple displaced right rib fractures. Electronically Signed   By: Agustin Cree M.D.   On: 01/04/2023 15:24   CT HEAD WO CONTRAST  Result Date: 01/04/2023 CLINICAL DATA:  Head trauma, moderate-severe; Polytrauma, blunt EXAM: CT HEAD WITHOUT CONTRAST CT CERVICAL SPINE WITHOUT CONTRAST TECHNIQUE: Multidetector CT imaging of the head and cervical spine was performed following the standard protocol without intravenous contrast. Multiplanar CT image reconstructions of the cervical spine were also generated. RADIATION DOSE REDUCTION: This exam was performed according to the departmental dose-optimization program which includes  automated exposure control, adjustment of the mA and/or kV according to patient size and/or use of iterative reconstruction technique. COMPARISON:  None Available. FINDINGS: CT HEAD FINDINGS Brain: There is a 5 mm left cerebral convexity subdural hematoma. There is small volume subarachnoid hemorrhage along the anterior lateral left temporal lobe no significant midline shift. No hydrocephalus. No CT evidence of a parenchymal hematoma. No CT evidence of an acute cortical infarct. 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 Vascular: No hyperdense vessel or unexpected calcification. Skull: Soft tissue hematoma and laceration along the right parietal scalp. No evidence underlying calvarial fracture. Sinuses/Orbits: Mastoid or middle ear. Paranasal sinuses are notable for polypoid mucosal thickening in the right maxillary sinus. There are likely postsurgical changes from bilateral maxillary antrostomies and right inferior ethmoidectomy. Orbits are unremarkable. Other: None. CT CERVICAL SPINE FINDINGS Limitations: Assessment of the cervical spine is limited due to the degree of motion artifact. Alignment: Normal. Skull base and vertebrae: No acute fracture. No primary bone lesion or focal pathologic process. Soft tissues and spinal canal: No prevertebral fluid or swelling. No visible canal hematoma. There is soft tissue stranding around the lateral right neck with asymmetric enlargement of the right sternocleidomastoid muscle body, which could represent and intramuscular hematoma. Disc levels:  No CT evidence of high-grade spinal canal stenosis. Upper chest: There is a mildly displaced fracture of posterior right second rib (series 4, image 56), right third rib (series 4, image 68) and right fourth rib (series 4, image 70). There is likely also a mildly displaced fracture of the right T4 transverse process (series 4, image  82). There  is a displaced fracture of the medial right clavicle with a large surrounding soft tissue hematoma. There is also a mildly displaced fracture of the right scapula. Other: Non IMPRESSION: CT HEAD: 1. Acute 5 mm left cerebral convexity subdural hematoma. No significant midline shift. 2. Small volume subarachnoid hemorrhage along the anterior lateral left temporal lobe. No evidence of intraventricular extension. 3. Soft tissue hematoma and laceration along the right parietal scalp. No evidence of underlying calvarial fracture. CT C SPINE: Assessment of the cervical spine is limited due to the degree of motion artifact. Within this limitation, no evidence of acute fracture or traumatic listhesis. 1. Mildly displaced fractures of the posterior right second, third, and fourth ribs. 2. Mildly displaced fracture of the right T4 transverse process. 3. Displaced fracture of the medial right clavicle with a large surrounding soft tissue hematoma. 4. Mildly displaced fracture of the right scapula. 5. Soft tissue stranding around the lateral right neck with an intramuscular hematoma in the right SCM. See same day CT Chest, Abdomen, Pelvis for additional findings below the thoracic inlet. Electronically Signed   By: Lorenza Cambridge M.D.   On: 01/04/2023 15:11   CT CERVICAL SPINE WO CONTRAST  Result Date: 01/04/2023 CLINICAL DATA:  Head trauma, moderate-severe; Polytrauma, blunt EXAM: CT HEAD WITHOUT CONTRAST CT CERVICAL SPINE WITHOUT CONTRAST TECHNIQUE: Multidetector CT imaging of the head and cervical spine was performed following the standard protocol without intravenous contrast. Multiplanar CT image reconstructions of the cervical spine were also generated. RADIATION DOSE REDUCTION: This exam was performed according to the departmental dose-optimization program which includes automated exposure control, adjustment of the mA and/or kV according to patient size and/or use of iterative reconstruction technique.  COMPARISON:  None Available. FINDINGS: CT HEAD FINDINGS Brain: There is a 5 mm left cerebral convexity subdural hematoma. There is small volume subarachnoid hemorrhage along the anterior lateral left temporal lobe no significant midline shift. No hydrocephalus. No CT evidence of a parenchymal hematoma. No CT evidence of an acute cortical infarct. 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 Vascular: No hyperdense vessel or unexpected calcification. Skull: Soft tissue hematoma and laceration along the right parietal scalp. No evidence underlying calvarial fracture. Sinuses/Orbits: Mastoid or middle ear. Paranasal sinuses are notable for polypoid mucosal thickening in the right maxillary sinus. There are likely postsurgical changes from bilateral maxillary antrostomies and right inferior ethmoidectomy. Orbits are unremarkable. Other: None. CT CERVICAL SPINE FINDINGS Limitations: Assessment of the cervical spine is limited due to the degree of motion artifact. Alignment: Normal. Skull base and vertebrae: No acute fracture. No primary bone lesion or focal pathologic process. Soft tissues and spinal canal: No prevertebral fluid or swelling. No visible canal hematoma. There is soft tissue stranding around the lateral right neck with asymmetric enlargement of the right sternocleidomastoid muscle body, which could represent and intramuscular hematoma. Disc levels:  No CT evidence of high-grade spinal canal stenosis. Upper chest: There is a mildly displaced fracture of posterior right second rib (series 4, image 56), right third rib (series 4, image 68) and right fourth rib (series 4, image 70). There is likely also a mildly displaced fracture of the right T4 transverse process (series 4, image 82). There is a displaced fracture of the medial right clavicle with a large surrounding soft tissue hematoma. There is also a mildly  displaced fracture of the right scapula. Other: Non IMPRESSION: CT HEAD: 1. Acute 5 mm left cerebral  convexity subdural hematoma. No significant midline shift. 2. Small volume subarachnoid hemorrhage along the anterior lateral left temporal lobe. No evidence of intraventricular extension. 3. Soft tissue hematoma and laceration along the right parietal scalp. No evidence of underlying calvarial fracture. CT C SPINE: Assessment of the cervical spine is limited due to the degree of motion artifact. Within this limitation, no evidence of acute fracture or traumatic listhesis. 1. Mildly displaced fractures of the posterior right second, third, and fourth ribs. 2. Mildly displaced fracture of the right T4 transverse process. 3. Displaced fracture of the medial right clavicle with a large surrounding soft tissue hematoma. 4. Mildly displaced fracture of the right scapula. 5. Soft tissue stranding around the lateral right neck with an intramuscular hematoma in the right SCM. See same day CT Chest, Abdomen, Pelvis for additional findings below the thoracic inlet. Electronically Signed   By: Lorenza Cambridge M.D.   On: 01/04/2023 15:11     Assessment/Plan: Estimated body mass index is 24.39 kg/m as calculated from the following:   Height as of this encounter: 5\' 10"  (1.778 m).   Weight as of this encounter: 77.41 kg.   77 year old trauma patient with a closed head injury with some small amount of traumatic subarachnoid blood, and old small left subdural hematoma without mass effect or shift, fairly minor lumbar compression fracture and multiple thoracic TP fractures.  Recommend repeat head CT tomorrow morning Hold any blood thinners LSO brace for L3 fracture   Tia Alert 01/04/2023 4:03 PM

## 2023-01-05 ENCOUNTER — Inpatient Hospital Stay (HOSPITAL_COMMUNITY): Payer: Medicare PPO

## 2023-01-05 LAB — BASIC METABOLIC PANEL
Anion gap: 10 (ref 5–15)
BUN: 26 mg/dL — ABNORMAL HIGH (ref 8–23)
CO2: 22 mmol/L (ref 22–32)
Calcium: 8.2 mg/dL — ABNORMAL LOW (ref 8.9–10.3)
Chloride: 109 mmol/L (ref 98–111)
Creatinine, Ser: 1.66 mg/dL — ABNORMAL HIGH (ref 0.61–1.24)
GFR, Estimated: 42 mL/min — ABNORMAL LOW (ref >60)
Glucose, Bld: 154 mg/dL — ABNORMAL HIGH (ref 70–99)
Potassium: 4.7 mmol/L (ref 3.5–5.1)
Sodium: 141 mmol/L (ref 135–145)

## 2023-01-05 LAB — CBC
HCT: 30.7 % — ABNORMAL LOW (ref 39.0–52.0)
Hemoglobin: 10.5 g/dL — ABNORMAL LOW (ref 13.0–17.0)
MCH: 29.9 pg (ref 26.0–34.0)
MCHC: 34.2 g/dL (ref 30.0–36.0)
MCV: 87.5 fL (ref 80.0–100.0)
Platelets: 136 10*3/uL — ABNORMAL LOW (ref 150–400)
RBC: 3.51 MIL/uL — ABNORMAL LOW (ref 4.22–5.81)
RDW: 14.4 % (ref 11.5–15.5)
WBC: 15.4 10*3/uL — ABNORMAL HIGH (ref 4.0–10.5)
nRBC: 0 % (ref 0.0–0.2)

## 2023-01-05 LAB — TYPE AND SCREEN
ABO/RH(D): O NEG
Unit division: 0

## 2023-01-05 LAB — BPAM RBC
Blood Product Expiration Date: 202407122359
ISSUE DATE / TIME: 202406171418
Unit Type and Rh: 5100

## 2023-01-05 MED ORDER — ENOXAPARIN SODIUM 30 MG/0.3ML IJ SOSY
30.0000 mg | PREFILLED_SYRINGE | Freq: Two times a day (BID) | INTRAMUSCULAR | Status: DC
  Administered 2023-01-06 – 2023-01-07 (×3): 30 mg via SUBCUTANEOUS
  Filled 2023-01-05 (×3): qty 0.3

## 2023-01-05 MED ORDER — QUETIAPINE FUMARATE 50 MG PO TABS
25.0000 mg | ORAL_TABLET | Freq: Every day | ORAL | Status: DC
  Administered 2023-01-05 – 2023-01-21 (×16): 25 mg via ORAL
  Filled 2023-01-05 (×18): qty 1

## 2023-01-05 MED ORDER — ROSUVASTATIN CALCIUM 20 MG PO TABS
40.0000 mg | ORAL_TABLET | Freq: Every day | ORAL | Status: DC
  Administered 2023-01-05 – 2023-01-21 (×17): 40 mg via ORAL
  Filled 2023-01-05 (×17): qty 2

## 2023-01-05 MED ORDER — CARVEDILOL 6.25 MG PO TABS
6.2500 mg | ORAL_TABLET | Freq: Two times a day (BID) | ORAL | Status: DC
  Administered 2023-01-05 – 2023-01-22 (×33): 6.25 mg via ORAL
  Filled 2023-01-05 (×35): qty 1

## 2023-01-05 MED ORDER — FLUTICASONE PROPIONATE 50 MCG/ACT NA SUSP
1.0000 | Freq: Every evening | NASAL | Status: DC
  Administered 2023-01-08 – 2023-01-21 (×9): 1 via NASAL
  Filled 2023-01-05 (×2): qty 16

## 2023-01-05 MED ORDER — VITAMIN D 25 MCG (1000 UNIT) PO TABS
1000.0000 [IU] | ORAL_TABLET | Freq: Every day | ORAL | Status: DC
  Administered 2023-01-05 – 2023-01-21 (×16): 1000 [IU] via ORAL
  Filled 2023-01-05 (×16): qty 1

## 2023-01-05 MED ORDER — LORATADINE 10 MG PO TABS
10.0000 mg | ORAL_TABLET | Freq: Every day | ORAL | Status: DC
  Administered 2023-01-05 – 2023-01-21 (×16): 10 mg via ORAL
  Filled 2023-01-05 (×16): qty 1

## 2023-01-05 MED ORDER — SODIUM CHLORIDE 0.9 % IV BOLUS
1000.0000 mL | Freq: Once | INTRAVENOUS | Status: AC
  Administered 2023-01-05: 1000 mL via INTRAVENOUS

## 2023-01-05 MED ORDER — VITAMIN B-12 100 MCG PO TABS
100.0000 ug | ORAL_TABLET | Freq: Every day | ORAL | Status: DC
  Administered 2023-01-05 – 2023-01-21 (×16): 100 ug via ORAL
  Filled 2023-01-05 (×18): qty 1

## 2023-01-05 MED ORDER — CHLORHEXIDINE GLUCONATE CLOTH 2 % EX PADS
6.0000 | MEDICATED_PAD | Freq: Every day | CUTANEOUS | Status: DC
  Administered 2023-01-05 – 2023-01-08 (×4): 6 via TOPICAL

## 2023-01-05 MED ORDER — ESCITALOPRAM OXALATE 10 MG PO TABS
5.0000 mg | ORAL_TABLET | Freq: Every day | ORAL | Status: DC
  Administered 2023-01-05 – 2023-01-22 (×17): 5 mg via ORAL
  Filled 2023-01-05 (×18): qty 1

## 2023-01-05 MED ORDER — ADULT MULTIVITAMIN W/MINERALS CH
1.0000 | ORAL_TABLET | Freq: Every day | ORAL | Status: DC
  Administered 2023-01-05 – 2023-01-21 (×15): 1 via ORAL
  Filled 2023-01-05 (×16): qty 1

## 2023-01-05 MED ORDER — PANTOPRAZOLE SODIUM 40 MG PO TBEC: 40.0000 mg | DELAYED_RELEASE_TABLET | Freq: Every day | ORAL | Status: DC

## 2023-01-05 MED ORDER — CALCIUM CARBONATE 1250 (500 CA) MG PO TABS
1250.0000 mg | ORAL_TABLET | Freq: Every day | ORAL | Status: DC
  Administered 2023-01-06 – 2023-01-21 (×16): 1250 mg via ORAL
  Filled 2023-01-05 (×17): qty 1

## 2023-01-05 MED ORDER — CARVEDILOL 3.125 MG PO TABS: 6.2500 mg | ORAL_TABLET | Freq: Every day | ORAL | Status: DC

## 2023-01-05 MED ORDER — RIVASTIGMINE TARTRATE 1.5 MG PO CAPS
3.0000 mg | ORAL_CAPSULE | Freq: Two times a day (BID) | ORAL | Status: DC
  Administered 2023-01-05 – 2023-01-21 (×32): 3 mg via ORAL
  Filled 2023-01-05 (×38): qty 2

## 2023-01-05 NOTE — Progress Notes (Signed)
Attempted to reach patient's daughter Sukhjit Fluckiger, no answer, voicemail full. Called other daughter Keenan Bachelor and patient's wife Pravin Rummel. Numerous questions answered to include: list of injuries, list of medications received, timing of medication received, list of all imaging performed, plans for repeat imaging, list of all labwork performed, plans for repeat labwork, indications for surgery, expectations for transfer out of ICU, expectations for discharge, who will be point of contact for case management, and visitation policy. Family encouraged to create MyChart profile for the patient to be able to view imaging, labs, and all notes in real time. Advised of SNF recommendation, they are in agreement. Will likely initiate insurance auth 6/19 after completion of therapy documentation.   Diamantina Monks, MD General and Trauma Surgery [facility] Surgery

## 2023-01-05 NOTE — Progress Notes (Signed)
Patient ID: Omar Kennedy, male   DOB: 1945-11-02, 77 y.o.   MRN: [id number] Patient remains awake and conversant but confused.  Maybe a little agitated.  Moves all extremities.  Head CT looks quite stable.  Left subdural hematoma appears to be subacute with a small amount of acute blood products layering posteriorly.  No significant edema or mass effect.  Basal cisterns are open.  Okay to mobilize in his brace as far as we are concerned.

## 2023-01-05 NOTE — Evaluation (Signed)
Clinical/Bedside Swallow Evaluation Patient Details  Name: Omar Kennedy MRN: [id number] Date of Birth: 07-03-1946  Today's Date: 01/05/2023 Time: SLP Start Time (ACUTE ONLY): 1200 SLP Stop Time (ACUTE ONLY): 1207 SLP Time Calculation (min) (ACUTE ONLY): 7 min  Past Medical History: History reviewed. No pertinent past medical history. Past Surgical History: History reviewed. No pertinent surgical history. HPI:  Omar Kennedy. Kirchhoff is a 77 y/o M with a PMH dementia who presented to the ED as a level 2 trauma after he was found down in an empty swimming pool behind his house. Sustained SAH/SDH, T11 fx, multiple thoracic and lumbar spine transverse process fractures, right clavicle and left scapula fracture, R HTX, R rib fx 5-6    Assessment / Plan / Recommendation  Clinical Impression  Assessment somewhat limited by pt's ability to attend and participate. He was alert but reluctant to be repositioned in bed due to pain, receptive to liquids but initially did not want to eat. Full oromotor exam could not be completed however no significant weakness noted amd majority of dentition appears intact. RN reported no difficulty drinking liquids however he could not swallow pills whole in puree. He consumed consecutive straw sips thin with adequate coordination of respiration and swallow without s/s aspiration across several trials. Oral manipulation and mastication of regular solid was timely, efficient and thorough. He consumed additional thin liquids at end of session without difficulty. Recommend regular texture, thin liquids, crush pills and pt may need set up and supervision to attend to meals. ST will follow up minimum of one time to ensure efficiency with recommendation. SLP Visit Diagnosis: Dysphagia, unspecified (R13.10)    Aspiration Risk  Mild aspiration risk    Diet Recommendation Regular;Thin liquid   Liquid Administration via: Cup;Straw Medication Administration: Crushed with puree  (expectorated pill in puree with nurse) Supervision: Staff to assist with self feeding;Full supervision/cueing for compensatory strategies Compensations: Slow rate;Small sips/bites;Minimize environmental distractions Postural Changes: Seated upright at 90 degrees    Other  Recommendations Oral Care Recommendations: Oral care BID    Recommendations for follow up therapy are one component of a multi-disciplinary discharge planning process, led by the attending physician.  Recommendations may be updated based on patient status, additional functional criteria and insurance authorization.  Follow up Recommendations Skilled nursing-short term rehab (<3 hours/day)      Assistance Recommended at Discharge    Functional Status Assessment Patient has had a recent decline in their functional status and demonstrates the ability to make significant improvements in function in a reasonable and predictable amount of time.  Frequency and Duration min 2x/week  2 weeks       Prognosis Prognosis for improved oropharyngeal function: Good Barriers to Reach Goals: Cognitive deficits      Swallow Study   General Date of Onset: 01/04/23 HPI: Omar Kennedy. Hoerl is a 77 y/o M with a PMH dementia who presented to the ED as a level 2 trauma after he was found down in an empty swimming pool behind his house. Sustained SAH/SDH, T11 fx, multiple thoracic and lumbar spine transverse process fractures, right clavicle and left scapula fracture, R HTX, R rib fx 5-6 Type of Study: Bedside Swallow Evaluation Previous Swallow Assessment:  (none) Diet Prior to this Study: Other (Comment) (sips thin and meds) Temperature Spikes Noted: No Respiratory Status: Nasal cannula History of Recent Intubation: No Behavior/Cognition: Alert;Distractible;Requires cueing (in pain) Oral Cavity Assessment:  (pt with decreased cooperation) Oral Care Completed by SLP: No Oral Cavity - Dentition:  (  will fully assess) Vision: Functional for  self-feeding Self-Feeding Abilities: Needs assist Patient Positioning: Upright in bed Baseline Vocal Quality: Normal Volitional Cough:  (decreased attention and unable to assess)    Oral/Motor/Sensory Function Overall Oral Motor/Sensory Function:  (will assess further in therapy)   Ice Chips Ice chips: Not tested   Thin Liquid Thin Liquid: Within functional limits Presentation: Straw    Nectar Thick Nectar Thick Liquid: Not tested   Honey Thick Honey Thick Liquid: Not tested   Puree Puree: Within functional limits   Solid     Solid: Within functional limits      Royce Macadamia 01/05/2023,1:15 PM

## 2023-01-05 NOTE — Progress Notes (Signed)
Orthopedic Tech Progress Note Patient Details:  Omar Kennedy [birth year] [id number]  Ortho Devices Type of Ortho Device: Lumbar corsett Ortho Device/Splint Location: BACK Ortho Device/Splint Interventions: Ordered, Adjustment   Post Interventions Patient Tolerated: Well Instructions Provided: Care of device  Donald Pore 01/05/2023, 9:09 AM

## 2023-01-05 NOTE — Evaluation (Signed)
Physical Therapy Evaluation Patient Details Name: Omar Kennedy MRN: [id number] DOB: [birth year] Today's Date: 01/05/2023  History of Present Illness  Pt is 77 yo male who presents on 01/04/23 after falling into empty pool. Sustained R scalp laceration, T11 fx, multiple T/L TP fxs, SAH/ SDH, R chest wall hematoma, R clavicle fx (NWB, sling), L scapula fx (WBAT), R rib fxs 5-6, R HTX. PMH: dementia  Clinical Impression  Pt admitted with above diagnosis. Pt from home with dementia, no family present to give PLOF or baseline cognitive status. On eval pt oriented only to self. Follows basic instructional commands ~25% of time. Pt needed max A +2 to come to EOB but became agitated once sitting so could not progress to standing or ambulating. Patient will benefit from continued inpatient follow up therapy, <3 hours/day.  Pt currently with functional limitations due to the deficits listed below (see PT Problem List). Pt will benefit from acute skilled PT to increase their independence and safety with mobility to allow discharge.          Recommendations for follow up therapy are one component of a multi-disciplinary discharge planning process, led by the attending physician.  Recommendations may be updated based on patient status, additional functional criteria and insurance authorization.  Follow Up Recommendations Can patient physically be transported by private vehicle: No     Assistance Recommended at Discharge Frequent or constant Supervision/Assistance  Patient can return home with the following  A lot of help with walking and/or transfers;A lot of help with bathing/dressing/bathroom;Assistance with cooking/housework;Assistance with feeding;Direct supervision/assist for medications management;Direct supervision/assist for financial management;Assist for transportation;Help with stairs or ramp for entrance    Equipment Recommendations Other (comment) (TBD)  Recommendations for Other Services  OT  consult    Functional Status Assessment Patient has had a recent decline in their functional status and demonstrates the ability to make significant improvements in function in a reasonable and predictable amount of time.     Precautions / Restrictions Precautions Precautions: Back;Fall Precaution Booklet Issued: No Precaution Comments: pt unable to understand or retain precautions, will need cueing every time Required Braces or Orthoses: Sling;Spinal Brace Spinal Brace: Lumbar corset;Applied in sitting position Restrictions Weight Bearing Restrictions: Yes RUE Weight Bearing: Non weight bearing LUE Weight Bearing: Weight bearing as tolerated      Mobility  Bed Mobility Overal bed mobility: Needs Assistance Bed Mobility: Supine to Sit, Sit to Supine     Supine to sit: Max assist, +2 for physical assistance Sit to supine: Max assist, +2 for physical assistance   General bed mobility comments: pt resistant to mobility due to pain, not assisting with coming to EOB but was able to maintain sitting once up    Transfers                   General transfer comment: pt unwilling to attempt today and began to escalate in sitting EOB so returned to supine    Ambulation/Gait               General Gait Details: deferred  Stairs            Wheelchair Mobility    Modified Rankin (Stroke Patients Only)       Balance Overall balance assessment: Needs assistance Sitting-balance support: No upper extremity supported, Feet supported Sitting balance-Leahy Scale: Fair Sitting balance - Comments: R lean but able to maintain sitting without assist Postural control: Right lateral lean  Pertinent Vitals/Pain Pain Assessment Pain Assessment: Faces Faces Pain Scale: Hurts even more Pain Location: generalized, R shoulder Pain Descriptors / Indicators: Discomfort, Grimacing, Moaning Pain Intervention(s): Limited  activity within patient's tolerance, Monitored during session, Repositioned, Premedicated before session    Home Living Family/patient expects to be discharged to:: Skilled nursing facility                   Additional Comments: pt from home with wife who is currently in a w/c due to an ankle fx per chart. Pt unable to give any history or PLOF    Prior Function               Mobility Comments: unsure but was ambulatory ADLs Comments: unsure     Hand Dominance        Extremity/Trunk Assessment   Upper Extremity Assessment Upper Extremity Assessment: Defer to OT evaluation    Lower Extremity Assessment Lower Extremity Assessment: Difficult to assess due to impaired cognition (pt able to move BLE's in bed)    Cervical / Trunk Assessment Cervical / Trunk Assessment: Kyphotic  Communication   Communication: Expressive difficulties;Receptive difficulties  Cognition Arousal/Alertness: Lethargic Behavior During Therapy: Anxious Overall Cognitive Status: No family/caregiver present to determine baseline cognitive functioning                                 General Comments: unsure of baseline, on eval pt only oriented to self        General Comments General comments (skin integrity, edema, etc.): HR 80 bpm, SPO2 96% on 2L O2     Exercises     Assessment/Plan    PT Assessment Patient needs continued PT services  PT Problem List Decreased balance;Decreased mobility;Pain;Decreased cognition;Decreased knowledge of use of DME;Decreased safety awareness;Decreased knowledge of precautions;Decreased activity tolerance;Decreased strength       PT Treatment Interventions DME instruction;Gait training;Functional mobility training;Therapeutic activities;Therapeutic exercise;Balance training;Neuromuscular re-education;Cognitive remediation;Patient/family education    PT Goals (Current goals can be found in the Care Plan section)  Acute Rehab PT  Goals Patient Stated Goal: none stated PT Goal Formulation: Patient unable to participate in goal setting Time For Goal Achievement: 01/19/23 Potential to Achieve Goals: Fair    Frequency Min 3X/week     Co-evaluation               AM-PAC PT "6 Clicks" Mobility  Outcome Measure Help needed turning from your back to your side while in a flat bed without using bedrails?: A Lot Help needed moving from lying on your back to sitting on the side of a flat bed without using bedrails?: Total Help needed moving to and from a bed to a chair (including a wheelchair)?: Total Help needed standing up from a chair using your arms (e.g., wheelchair or bedside chair)?: Total Help needed to walk in hospital room?: Total Help needed climbing 3-5 steps with a railing? : Total 6 Click Score: 7    End of Session Equipment Utilized During Treatment: Other (comment);Oxygen (R sling) Activity Tolerance: Patient limited by pain Patient left: in bed;with call bell/phone within reach;with bed alarm set Nurse Communication: Mobility status PT Visit Diagnosis: Pain;History of falling (Z91.81);Difficulty in walking, not elsewhere classified (R26.2) Pain - Right/Left: Right Pain - part of body: Shoulder    Time: 1610-9604 PT Time Calculation (min) (ACUTE ONLY): 17 min   Charges:   PT Evaluation $PT Eval Moderate Complexity: 1  Mod          Lyanne Co, PT  Acute Rehab Services Secure chat preferred Office [phone]   Omar Kennedy Omar Kennedy 01/05/2023, 1:40 PM

## 2023-01-05 NOTE — Plan of Care (Signed)
  Problem: Clinical Measurements: Goal: Ability to maintain clinical measurements within normal limits will improve Outcome: Progressing Goal: Will remain free from infection Outcome: Progressing Goal: Diagnostic test results will improve Outcome: Progressing Goal: Respiratory complications will improve Outcome: Progressing Goal: Cardiovascular complication will be avoided Outcome: Progressing   Problem: Activity: Goal: Risk for activity intolerance will decrease Outcome: Progressing   Problem: Safety: Goal: Ability to remain free from injury will improve Outcome: Progressing   Problem: Education: Goal: Knowledge of General Education information will improve Description: Including pain rating scale, medication(s)/side effects and non-pharmacologic comfort measures Outcome: Not Progressing   Problem: Health Behavior/Discharge Planning: Goal: Ability to manage health-related needs will improve Outcome: Not Progressing   Problem: Nutrition: Goal: Adequate nutrition will be maintained Outcome: Not Progressing   Problem: Coping: Goal: Level of anxiety will decrease Outcome: Not Progressing   Problem: Elimination: Goal: Will not experience complications related to bowel motility Outcome: Not Progressing Goal: Will not experience complications related to urinary retention Outcome: Not Progressing   Problem: Pain Managment: Goal: General experience of comfort will improve Outcome: Not Progressing   Problem: Skin Integrity: Goal: Risk for impaired skin integrity will decrease Outcome: Not Progressing   Problem: Safety: Goal: Non-violent Restraint(s) Outcome: Not Progressing

## 2023-01-05 NOTE — Progress Notes (Signed)
Trauma/Critical Care Follow Up Note  Subjective:    Overnight Issues:   Objective:  Vital signs for last 24 hours: Temp:  [97.4 F (36.3 C)-99 F (37.2 C)] 97.4 F (36.3 C) (06/18 0800) Pulse Rate:  [75-95] 79 (06/18 0800) Resp:  [14-26] 14 (06/18 0800) BP: (82-160)/(56-94) 134/66 (06/18 0800) SpO2:  [90 %-100 %] 94 % (06/18 0800) Weight:  [77.1 kg] 77.1 kg (06/17 1458)  Hemodynamic parameters for last 24 hours:    Intake/Output from previous day: 06/17 0701 - 06/18 0700 In: 1810.1 [I.V.:1302.7; Blood:315; IV Piggyback:192.4] Out: 525 [Urine:525]  Intake/Output this shift: Total I/O In: 75 [I.V.:75] Out: -   Vent settings for last 24 hours:    Physical Exam:  Gen: comfortable, no distress Neuro: oriented to self only HEENT: PERRL Neck: c-collar in place CV: RRR Pulm: unlabored breathing on [place] Abd: soft, NT    GU: urine clear and yellow, +spontaneous voids Extr: wwp, no edema  Results for orders placed or performed during the hospital encounter of 01/04/23 (from the past 24 hour(s))  Comprehensive metabolic panel     Status: Abnormal   Collection Time: 01/04/23  2:12 PM  Result Value Ref Range   Sodium 142 135 - 145 mmol/L   Potassium 3.9 3.5 - 5.1 mmol/L   Chloride 111 98 - 111 mmol/L   CO2 18 (L) 22 - 32 mmol/L   Glucose, Bld 218 (H) 70 - 99 mg/dL   BUN 21 8 - 23 mg/dL   Creatinine, Ser 4.54 (H) 0.61 - 1.24 mg/dL   Calcium 8.5 (L) 8.9 - 10.3 mg/dL   Total Protein 4.9 (L) 6.5 - 8.1 g/dL   Albumin 3.2 (L) 3.5 - 5.0 g/dL   AST 69 (H) 15 - 41 U/L   ALT 40 0 - 44 U/L   Alkaline Phosphatase 52 38 - 126 U/L   Total Bilirubin 2.4 (H) 0.3 - 1.2 mg/dL   GFR, Estimated 35 (L) >60 mL/min   Anion gap 13 5 - 15  CBC     Status: Abnormal   Collection Time: 01/04/23  2:12 PM  Result Value Ref Range   WBC 35.3 (H) 4.0 - 10.5 K/uL   RBC 3.61 (L) 4.22 - 5.81 MIL/uL   Hemoglobin 10.8 (L) 13.0 - 17.0 g/dL   HCT 09.8 (L) 11.9 - 14.7 %   MCV 89.2 80.0 - 100.0 fL    MCH 29.9 26.0 - 34.0 pg   MCHC 33.5 30.0 - 36.0 g/dL   RDW 82.9 56.2 - 13.0 %   Platelets 179 150 - 400 K/uL   nRBC 0.1 0.0 - 0.2 %  Ethanol     Status: None   Collection Time: 01/04/23  2:12 PM  Result Value Ref Range   Alcohol, Ethyl (B) <10 <10 mg/dL  Urinalysis, Routine w reflex microscopic -Urine, Clean Catch     Status: Abnormal   Collection Time: 01/04/23  2:12 PM  Result Value Ref Range   Color, Urine YELLOW YELLOW   APPearance HAZY (A) CLEAR   Specific Gravity, Urine 1.025 1.005 - 1.030   pH 5.0 5.0 - 8.0   Glucose, UA NEGATIVE NEGATIVE mg/dL   Hgb urine dipstick MODERATE (A) NEGATIVE   Bilirubin Urine NEGATIVE NEGATIVE   Ketones, ur NEGATIVE NEGATIVE mg/dL   Protein, ur 30 (A) NEGATIVE mg/dL   Nitrite NEGATIVE NEGATIVE   Leukocytes,Ua NEGATIVE NEGATIVE   RBC / HPF 6-10 0 - 5 RBC/hpf   WBC, UA 6-10 0 -  5 WBC/hpf   Bacteria, UA RARE (A) NONE SEEN   Squamous Epithelial / HPF 0-5 0 - 5 /HPF   Mucus PRESENT    Hyaline Casts, UA PRESENT    Sperm, UA PRESENT   Lactic acid, plasma     Status: Abnormal   Collection Time: 01/04/23  2:12 PM  Result Value Ref Range   Lactic Acid, Venous 7.6 (HH) 0.5 - 1.9 mmol/L  Protime-INR     Status: Abnormal   Collection Time: 01/04/23  2:12 PM  Result Value Ref Range   Prothrombin Time 19.9 (H) 11.4 - 15.2 seconds   INR 1.7 (H) 0.8 - 1.2  Troponin I (High Sensitivity)     Status: Abnormal   Collection Time: 01/04/23  2:12 PM  Result Value Ref Range   Troponin I (High Sensitivity) 61 (H) <18 ng/L  CK     Status: Abnormal   Collection Time: 01/04/23  2:12 PM  Result Value Ref Range   Total CK 1,972 (H) 49 - 397 U/L  CBG monitoring, ED     Status: Abnormal   Collection Time: 01/04/23  2:19 PM  Result Value Ref Range   Glucose-Capillary 200 (H) 70 - 99 mg/dL  Type and screen Ordered by PROVIDER DEFAULT     Status: None (Preliminary result)   Collection Time: 01/04/23  2:30 PM  Result Value Ref Range   ABO/RH(D) O NEG     Antibody Screen NEG    Sample Expiration 01/07/2023,2359    Unit Number [id number]    Blood Component Type RED CELLS,LR    Unit division 00    Status of Unit ISSUED    Unit tag comment VERBAL ORDERS PER DR LAWSING    Transfusion Status OK TO TRANSFUSE    Crossmatch Result COMPATIBLE   I-Stat Chem 8, ED     Status: Abnormal   Collection Time: 01/04/23  2:36 PM  Result Value Ref Range   Sodium 142 135 - 145 mmol/L   Potassium 4.2 3.5 - 5.1 mmol/L   Chloride 110 98 - 111 mmol/L   BUN 28 (H) 8 - 23 mg/dL   Creatinine, Ser 1.19 (H) 0.61 - 1.24 mg/dL   Glucose, Bld 147 (H) 70 - 99 mg/dL   Calcium, Ion 8.29 (L) 1.15 - 1.40 mmol/L   TCO2 19 (L) 22 - 32 mmol/L   Hemoglobin 10.2 (L) 13.0 - 17.0 g/dL   HCT 56.2 (L) 13.0 - 86.5 %  Prepare RBC     Status: None   Collection Time: 01/04/23  2:57 PM  Result Value Ref Range   Order Confirmation      ORDER PROCESSED BY BLOOD BANK Performed at [facility], [address] [place], [place] [address]   Troponin I (High Sensitivity)     Status: Abnormal   Collection Time: 01/04/23  4:27 PM  Result Value Ref Range   Troponin I (High Sensitivity) 143 (HH) <18 ng/L  ABO/Rh     Status: None   Collection Time: 01/04/23  6:00 PM  Result Value Ref Range   ABO/RH(D)      O NEG Performed at [facility], [address], [place], [place] [address]   MRSA Next Gen by PCR, Nasal     Status: None   Collection Time: 01/04/23  6:25 PM   Specimen: Nasal Mucosa; Nasal Swab  Result Value Ref Range   MRSA by PCR Next Gen NOT DETECTED NOT DETECTED  CBC  Status: Abnormal   Collection Time: 01/05/23  3:07 AM  Result Value Ref Range   WBC 15.4 (H) 4.0 - 10.5 K/uL   RBC 3.51 (L) 4.22 - 5.81 MIL/uL   Hemoglobin 10.5 (L) 13.0 - 17.0 g/dL   HCT 16.1 (L) 09.6 - 04.5 %   MCV 87.5 80.0 - 100.0 fL   MCH 29.9 26.0 - 34.0 pg   MCHC 34.2 30.0 - 36.0 g/dL   RDW 40.9 81.1 - 91.4 %   Platelets 136 (L) 150 - 400 K/uL   nRBC 0.0 0.0 - 0.2 %  Basic  metabolic panel     Status: Abnormal   Collection Time: 01/05/23  3:07 AM  Result Value Ref Range   Sodium 141 135 - 145 mmol/L   Potassium 4.7 3.5 - 5.1 mmol/L   Chloride 109 98 - 111 mmol/L   CO2 22 22 - 32 mmol/L   Glucose, Bld 154 (H) 70 - 99 mg/dL   BUN 26 (H) 8 - 23 mg/dL   Creatinine, Ser 7.82 (H) 0.61 - 1.24 mg/dL   Calcium 8.2 (L) 8.9 - 10.3 mg/dL   GFR, Estimated 42 (L) >60 mL/min   Anion gap 10 5 - 15    Assessment & Plan: The plan of care was discussed with the bedside nurse for the day, who is in agreement with this plan and no additional concerns were raised.   Present on Admission: **None**    LOS: 1 day   Additional comments:I reviewed the patient's new clinical lab test results.   and I reviewed the patients new imaging test results.    Found down in an empty swimming pool   R scalp laceration - closed in ED with 9 staples on 6/17 T11 fracture, multiple thoracic and lumbar spine transverse process fractures - per NS c/s, plan for brace SAH/SDH - NS c/s Dr. Yetta Barre, IV keppra x 7 days R chest wall hematoma  R clavicle fracture - ortho c/s, sling, NWB L scapula fracture - ortho c/s, WBAT R HTX, R Fib FX 5-6 - no PTX on CT. Multimodal pain control, IS, CXR in AM  ABL anemia - s/p 1 u RBC 6/17, hhgb stable C-collar - significant motion on CT, but no fx, unable to participate in clinical exam, likely not going to sit still for MRI if unable to sit still enough for CT. Will attempt flex ex films and remove collar.  FEN - NPO, IVF  VTE - SCD's, start LMWH in AM (okayed by NSGY) ID - Tdap given in ED Admit - 4NP, PT/OT/SLP, anticipate SNF   Diamantina Monks, MD Trauma & General Surgery Please use [email or web address] to contact on call provider  01/05/2023  *Care during the described time interval was provided by me. I have reviewed this patient's available data, including medical history, events of note, physical examination and test results as part of my  evaluation.

## 2023-01-05 NOTE — Progress Notes (Signed)
Pt restrained with order at this time due to confusion and attempting to get out of bed.

## 2023-01-06 NOTE — Progress Notes (Signed)
Patient would wake up for a few minutes to tell me to stop and fall back to sleep when trying to do med passes. Patient did not eat any of his meals today. CNA and I both attempted to keep him awake enough to feed him. He became upset with PT when they tried to work with him. He refused to be moved in the bed and yelled to be left alone. He did allow to place pillows for changing positions.

## 2023-01-06 NOTE — Progress Notes (Signed)
OT Cancellation Note  Patient Details Name: Omar Kennedy MRN: [id number] DOB: 09-22-1945   Cancelled Treatment:    Reason Eval/Treat Not Completed: Patient's level of consciousness;Patient declined, no reason specified (Attempted to engage pt in therapy for 10 mins, pt becoming more agitated as time passed. Pt resistant to movement, not safe to continue session. OT to f/u with patient as able.)  01/06/2023  AB, OTR/L  Acute Rehabilitation Services  Office: [phone]   [name] 01/06/2023, 2:47 PM

## 2023-01-06 NOTE — Progress Notes (Signed)
[facility] Progress Note     Subjective: CC:  Sleeping. Easily arouses to voice. Oriented to person and hospital. When I ask what happened to him he says "its obvious" but is unable to describe what happened/why he is here. Denies pain.   Objective: Vital signs in last 24 hours: Temp:  [97.4 F (36.3 C)-98.2 F (36.8 C)] 97.6 F (36.4 C) (06/19 0500) Pulse Rate:  [72-86] 72 (06/19 0500) Resp:  [14-29] 18 (06/19 0500) BP: (128-153)/(63-75) 128/69 (06/19 0500) SpO2:  [94 %-97 %] 96 % (06/18 2010) Last BM Date : 01/06/23  Intake/Output from previous day: 06/18 0701 - 06/19 0700 In: 1139.7 [I.V.:393.9; IV Piggyback:745.8] Out: 375 [Urine:375] Intake/Output this shift: No intake/output data recorded.  PE: Gen: comfortable, no distress Neuro: oriented to self only/hospital.  HEENT: PERRL Neck: c-collar removed  CV: RRR Pulm: chest wall with evolution of hematoma - monitor. unlabored breathing on room air Abd: soft, NT    GU: urine clear and yellow, +spontaneous voids Extr: wwp, no edema  Lab Results:  Recent Labs    01/04/23 1412 01/04/23 1436 01/05/23 0307  WBC 35.3*  --  15.4*  HGB 10.8* 10.2* 10.5*  HCT 32.2* 30.0* 30.7*  PLT 179  --  136*   BMET Recent Labs    01/04/23 1412 01/04/23 1436 01/05/23 0307  NA 142 142 141  K 3.9 4.2 4.7  CL 111 110 109  CO2 18*  --  22  GLUCOSE 218* 209* 154*  BUN 21 28* 26*  CREATININE 1.95* 1.80* 1.66*  CALCIUM 8.5*  --  8.2*   PT/INR Recent Labs    01/04/23 1412  LABPROT 19.9*  INR 1.7*   CMP     Component Value Date/Time   NA 141 01/05/2023 0307   K 4.7 01/05/2023 0307   CL 109 01/05/2023 0307   CO2 22 01/05/2023 0307   GLUCOSE 154 (H) 01/05/2023 0307   BUN 26 (H) 01/05/2023 0307   CREATININE 1.66 (H) 01/05/2023 0307   CALCIUM 8.2 (L) 01/05/2023 0307   PROT 4.9 (L) 01/04/2023 1412   ALBUMIN 3.2 (L) 01/04/2023 1412   AST 69 (H) 01/04/2023 1412   ALT 40 01/04/2023 1412   ALKPHOS 52  01/04/2023 1412   BILITOT 2.4 (H) 01/04/2023 1412   GFRNONAA 42 (L) 01/05/2023 0307   Lipase  No results found for: "LIPASE"     Studies/Results: DG Cerv Spine Flex&Ext Only  Result Date: 01/05/2023 CLINICAL DATA:  Trauma, pain EXAM: CERVICAL SPINE - FLEXION AND EXTENSION VIEWS ONLY COMPARISON:  CT done on 01/04/2023 FINDINGS: C7 vertebra is not optimally visualized in the flexion and extension lateral views. No fracture is seen in the visualized bony structures. Alignment of posterior margins of vertebral bodies is unremarkable in the extension lateral view. In the flexion lateral view, there is possible minimal 1 mm anterolisthesis at C4-C5 level. Prevertebral soft tissues are unremarkable. IMPRESSION: C7 vertebrae is not included in the images and not evaluated. No fracture is seen in the visualized portions of the cervical spine. In the flexion lateral view, there is minimal 1 mm anterolisthesis at C4-C5 level. Alignment of posterior vertebral bodies appears normal in the extension lateral view. Electronically Signed   By: Ernie Avena M.D.   On: 01/05/2023 14:52   DG CHEST PORT 1 VIEW  Result Date: 01/05/2023 CLINICAL DATA:  Right hemothorax EXAM: PORTABLE CHEST 1 VIEW COMPARISON:  Previous studies including the examination of 01/04/2023 FINDINGS: Transverse diameter of heart is  increased. Thoracic aorta is tortuous. There is previous coronary bypass surgery. Left lung is clear. Right lung appears smaller than left. There is slight improvement in aeration in right lung. Multiple displaced right rib fractures are seen. There is pleural density in the periphery of right upper and right mid lung fields, possibly loculated pleural effusion. Right lateral CP angle is clear. There is no evidence of large pneumothorax in this supine radiograph. IMPRESSION: There is pleural density in the periphery of right upper and right mid lung fields suggesting possible loculated pleural effusion. There are  no new infiltrates. Other findings as described above. Electronically Signed   By: Ernie Avena M.D.   On: 01/05/2023 08:51   CT HEAD WO CONTRAST ( )  Result Date: 01/05/2023 CLINICAL DATA:  Follow-up subarachnoid hemorrhage EXAM: CT HEAD WITHOUT CONTRAST TECHNIQUE: Contiguous axial images were obtained from the base of the skull through the vertex without intravenous contrast. RADIATION DOSE REDUCTION: This exam was performed according to the departmental dose-optimization program which includes automated exposure control, adjustment of the mA and/or kV according to patient size and/or use of iterative reconstruction technique. COMPARISON:  Yesterday FINDINGS: Brain: Subarachnoid hemorrhage especially along the left temporal convexity is non progressed. Mild progression of a intermediate to low-density subdural collection measuring up to 5 mm along the left cerebral convexity. A higher density subdural hematoma along the medial left occipital convexity is new/increased. No midline shift, hydrocephalus, or indication of acute infarct. Vascular: No hyperdense vessel or unexpected calcification. Skull: No acute fracture.  Scalp and upper right neck swelling. Sinuses/Orbits: No acute finding IMPRESSION: Mild increase in subdural hematoma along the left cerebral convexity measuring up to 5 mm in thickness. Unchanged subarachnoid hemorrhage especially along the left temporal convexity. Electronically Signed   By: Tiburcio Pea M.D.   On: 01/05/2023 05:20   CT CHEST ABDOMEN PELVIS W CONTRAST  Result Date: 01/04/2023 CLINICAL DATA:  Polytrauma, blunt. EXAM: CT CHEST, ABDOMEN, AND PELVIS WITH CONTRAST TECHNIQUE: Multidetector CT imaging of the chest, abdomen and pelvis was performed following the standard protocol during bolus administration of intravenous contrast. RADIATION DOSE REDUCTION: This exam was performed according to the departmental dose-optimization program which includes automated exposure  control, adjustment of the mA and/or kV according to patient size and/or use of iterative reconstruction technique. CONTRAST:  75mL OMNIPAQUE IOHEXOL 350 MG/ML SOLN COMPARISON:  CTs of the chest, abdomen and pelvis dated 12/29/2019. FINDINGS: CT CHEST FINDINGS Cardiovascular: Status post median sternotomy and CABG. There is atherosclerosis of the aorta, great vessels and coronary arteries. The heart size is normal. No significant pericardial fluid. There is no evidence of acute arterial injury. Mediastinum/Nodes: There is posterior mediastinal/paraspinal hemorrhage asymmetric to the right, likely related to underlying rib fractures. There is no hematoma within the anterior or middle mediastinum. No enlarged mediastinal, hilar or axillary lymph nodes are identified. No evidence of esophageal or tracheal bronchial injury. Lungs/Pleura: Small intermediate density right pleural effusion, likely reflecting hemothorax. No evidence of pneumothorax or left-sided pleural effusion. There is mild dependent atelectasis or contusion in the right lung. Musculoskeletal/Chest wall: Status post median sternotomy without evidence of acute sternal fracture. There are multiple posterolateral rib fractures on the right, involving the 3rd through 12th ribs. Some of these fractures involve the costovertebral junctions. In addition, there are multiple right-sided transverse process fractures in the thoracic spine. No definite thoracic vertebral body fracture. No definite left-sided rib fractures are seen. Asymmetric enlargement of the right pectoralis muscle with overlying subcutaneous contusion. There is  also asymmetric enlargement of the erector spinae musculature on the right, likely due to hemorrhage. CT ABDOMEN AND PELVIS FINDINGS Hepatobiliary: No evidence of acute hepatic injury or focal liver lesion. No evidence of gallstones, gallbladder wall thickening or biliary dilatation. Pancreas: Unremarkable. No pancreatic ductal dilatation  or surrounding inflammatory changes. Spleen: No evidence of acute splenic injury. The spleen is normal in size without focal abnormality. Adrenals/Urinary Tract: Both adrenal glands appear normal. No evidence of urinary tract calculus, suspicious renal lesion or hydronephrosis. The bladder appears normal for its degree of distention. Stomach/Bowel: No enteric contrast administered. No evidence of bowel or mesenteric injury. Mildly prominent stool in the distal colon. Vascular/Lymphatic: Aortic and branch vessel atherosclerosis without evidence of aneurysm or large vessel occlusion. No evidence of acute vascular injury. A small amount of hemorrhage tracks along the right psoas muscle, attributed to the rib and transverse process fractures. No other significant retroperitoneal hemorrhage. Reproductive: The prostate gland and seminal vesicles appear unremarkable. Other: No evidence of hemoperitoneum or pneumoperitoneum. The anterior abdominal wall appears intact. Musculoskeletal: Transitional lumbosacral anatomy with a partially lumbarized S1 segment. Mildly displaced acute fractures of the right L1 through L4 transverse processes. There is a mild superior endplate compression fracture involving the L4 vertebral body, asymmetric to the left. No significant loss of vertebral body height or osseous retropulsion. No evidence of acute pelvic or proximal femur fracture. As above, asymmetric enlargement of the retro spinae musculature, likely due to hemorrhage. A small amount of hemorrhage tracks along the right psoas musculature. Chronic calcification lateral to the left ischium is unchanged. IMPRESSION: 1. Multiple right-sided rib fractures involving the 3rd through 12th ribs. Some of these fractures involve the costovertebral junctions. 2. Multiple right-sided transverse process fractures in the thoracic and spine. Mild superior endplate compression fracture of the L4 vertebral body without significant loss of vertebral  body height or osseous retropulsion. 3. Small right hemothorax with mild dependent atelectasis or contusion in the right lung. No evidence of pneumothorax. 4. Asymmetric enlargement of the right pectoralis and erector spinae musculature, likely due to hemorrhage. There is overlying subcutaneous contusion in the right chest wall. 5. No evidence of acute intra-abdominal or pelvic injury. 6.  Aortic Atherosclerosis (ICD10-I70.0). 7. These results were called by telephone at the time of interpretation on 01/04/2023 at 3:00 pm to Dr Kris Mouton, who verbally acknowledged these results. Electronically Signed   By: Carey Bullocks M.D.   On: 01/04/2023 15:29   DG Pelvis Portable  Result Date: 01/04/2023 CLINICAL DATA:  Fall into an empty pool. EXAM: PORTABLE PELVIS 1 VIEWS COMPARISON:  None Available. FINDINGS: There is no evidence of pelvic fracture or diastasis. No pelvic bone lesions are seen. IMPRESSION: No acute fracture or dislocation. Electronically Signed   By: Agustin Cree M.D.   On: 01/04/2023 15:27   DG Chest Port 1 View  Result Date: 01/04/2023 CLINICAL DATA:  Trauma.  Fall into an empty pool. EXAM: PORTABLE CHEST 1 VIEW COMPARISON:  Chest radiograph dated 12/28/2019 FINDINGS: Normal lung volumes. Asymmetric opacification of the right lung. Small right pleural effusion. No pneumothorax. The heart size and mediastinal contours are within normal limits. Multiple displaced right rib fractures. Median sternotomy wires are nondisplaced. IMPRESSION: 1. Asymmetric opacification of the right lung, likely due to a combination of atelectasis and pleural effusion. 2. Multiple displaced right rib fractures. Electronically Signed   By: Agustin Cree M.D.   On: 01/04/2023 15:24   CT HEAD WO CONTRAST  Result Date: 01/04/2023 CLINICAL DATA:  Head trauma, moderate-severe; Polytrauma, blunt EXAM: CT HEAD WITHOUT CONTRAST CT CERVICAL SPINE WITHOUT CONTRAST TECHNIQUE: Multidetector CT imaging of the head and cervical spine  was performed following the standard protocol without intravenous contrast. Multiplanar CT image reconstructions of the cervical spine were also generated. RADIATION DOSE REDUCTION: This exam was performed according to the departmental dose-optimization program which includes automated exposure control, adjustment of the mA and/or kV according to patient size and/or use of iterative reconstruction technique. COMPARISON:  None Available. FINDINGS: CT HEAD FINDINGS Brain: There is a 5 mm left cerebral convexity subdural hematoma. There is small volume subarachnoid hemorrhage along the anterior lateral left temporal lobe no significant midline shift. No hydrocephalus. No CT evidence of a parenchymal hematoma. No CT evidence of an acute cortical infarct. 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 Vascular: No hyperdense vessel or unexpected calcification. Skull: Soft tissue hematoma and laceration along the right parietal scalp. No evidence underlying calvarial fracture. Sinuses/Orbits: Mastoid or middle ear. Paranasal sinuses are notable for polypoid mucosal thickening in the right maxillary sinus. There are likely postsurgical changes from bilateral maxillary antrostomies and right inferior ethmoidectomy. Orbits are unremarkable. Other: None. CT CERVICAL SPINE FINDINGS Limitations: Assessment of the cervical spine is limited due to the degree of motion artifact. Alignment: Normal. Skull base and vertebrae: No acute fracture. No primary bone lesion or focal pathologic process. Soft tissues and spinal canal: No prevertebral fluid or swelling. No visible canal hematoma. There is soft tissue stranding around the lateral right neck with asymmetric enlargement of the right sternocleidomastoid muscle body, which could represent and intramuscular hematoma. Disc levels:  No CT evidence of high-grade spinal canal stenosis. Upper  chest: There is a mildly displaced fracture of posterior right second rib (series 4, image 56), right third rib (series 4, image 68) and right fourth rib (series 4, image 70). There is likely also a mildly displaced fracture of the right T4 transverse process (series 4, image 82). There is a displaced fracture of the medial right clavicle with a large surrounding soft tissue hematoma. There is also a mildly displaced fracture of the right scapula. Other: Non IMPRESSION: CT HEAD: 1. Acute 5 mm left cerebral convexity subdural hematoma. No significant midline shift. 2. Small volume subarachnoid hemorrhage along the anterior lateral left temporal lobe. No evidence of intraventricular extension. 3. Soft tissue hematoma and laceration along the right parietal scalp. No evidence of underlying calvarial fracture. CT C SPINE: Assessment of the cervical spine is limited due to the degree of motion artifact. Within this limitation, no evidence of acute fracture or traumatic listhesis. 1. Mildly displaced fractures of the posterior right second, third, and fourth ribs. 2. Mildly displaced fracture of the right T4 transverse process. 3. Displaced fracture of the medial right clavicle with a large surrounding soft tissue hematoma. 4. Mildly displaced fracture of the right scapula. 5. Soft tissue stranding around the lateral right neck with an intramuscular hematoma in the right SCM. See same day CT Chest, Abdomen, Pelvis for additional findings below the thoracic inlet. Electronically Signed   By: Lorenza Cambridge M.D.   On: 01/04/2023 15:11   CT CERVICAL SPINE WO CONTRAST  Result Date: 01/04/2023 CLINICAL DATA:  Head trauma, moderate-severe; Polytrauma, blunt EXAM: CT HEAD WITHOUT CONTRAST CT CERVICAL SPINE WITHOUT CONTRAST TECHNIQUE: Multidetector CT imaging of the head and cervical spine was performed following the standard protocol without intravenous contrast. Multiplanar CT image reconstructions  of the cervical spine were  also generated. RADIATION DOSE REDUCTION: This exam was performed according to the departmental dose-optimization program which includes automated exposure control, adjustment of the mA and/or kV according to patient size and/or use of iterative reconstruction technique. COMPARISON:  None Available. FINDINGS: CT HEAD FINDINGS Brain: There is a 5 mm left cerebral convexity subdural hematoma. There is small volume subarachnoid hemorrhage along the anterior lateral left temporal lobe no significant midline shift. No hydrocephalus. No CT evidence of a parenchymal hematoma. No CT evidence of an acute cortical infarct. 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 Vascular: No hyperdense vessel or unexpected calcification. Skull: Soft tissue hematoma and laceration along the right parietal scalp. No evidence underlying calvarial fracture. Sinuses/Orbits: Mastoid or middle ear. Paranasal sinuses are notable for polypoid mucosal thickening in the right maxillary sinus. There are likely postsurgical changes from bilateral maxillary antrostomies and right inferior ethmoidectomy. Orbits are unremarkable. Other: None. CT CERVICAL SPINE FINDINGS Limitations: Assessment of the cervical spine is limited due to the degree of motion artifact. Alignment: Normal. Skull base and vertebrae: No acute fracture. No primary bone lesion or focal pathologic process. Soft tissues and spinal canal: No prevertebral fluid or swelling. No visible canal hematoma. There is soft tissue stranding around the lateral right neck with asymmetric enlargement of the right sternocleidomastoid muscle body, which could represent and intramuscular hematoma. Disc levels:  No CT evidence of high-grade spinal canal stenosis. Upper chest: There is a mildly displaced fracture of posterior right second rib (series 4, image 56), right third rib (series 4, image 68) and  right fourth rib (series 4, image 70). There is likely also a mildly displaced fracture of the right T4 transverse process (series 4, image 82). There is a displaced fracture of the medial right clavicle with a large surrounding soft tissue hematoma. There is also a mildly displaced fracture of the right scapula. Other: Non IMPRESSION: CT HEAD: 1. Acute 5 mm left cerebral convexity subdural hematoma. No significant midline shift. 2. Small volume subarachnoid hemorrhage along the anterior lateral left temporal lobe. No evidence of intraventricular extension. 3. Soft tissue hematoma and laceration along the right parietal scalp. No evidence of underlying calvarial fracture. CT C SPINE: Assessment of the cervical spine is limited due to the degree of motion artifact. Within this limitation, no evidence of acute fracture or traumatic listhesis. 1. Mildly displaced fractures of the posterior right second, third, and fourth ribs. 2. Mildly displaced fracture of the right T4 transverse process. 3. Displaced fracture of the medial right clavicle with a large surrounding soft tissue hematoma. 4. Mildly displaced fracture of the right scapula. 5. Soft tissue stranding around the lateral right neck with an intramuscular hematoma in the right SCM. See same day CT Chest, Abdomen, Pelvis for additional findings below the thoracic inlet. Electronically Signed   By: Lorenza Cambridge M.D.   On: 01/04/2023 15:11    Anti-infectives: Anti-infectives (From admission, onward)    Start     Dose/Rate Route Frequency Ordered Stop   01/04/23 1530  ceFAZolin (ANCEF) IVPB 2g/100 mL premix        2 g 200 mL/hr over 30 Minutes Intravenous  Once 01/04/23 1526 01/04/23 1634       Assessment/Plan  Found down in an empty swimming pool    R scalp laceration - closed in ED with 9 staples on 6/17 T11 fracture, multiple thoracic and lumbar spine  transverse process fractures - per NS c/s, mobilize in brace SAH/SDH - NS c/s Dr. Yetta Barre,  repeat CT stable 6/18, IV keppra x 7 days R chest wall hematoma  R clavicle fracture - ortho c/s, sling, NWB L scapula fracture - ortho c/s, WBAT R HTX, R Fib FX 5-6 - no PTX on CT. Multimodal pain control, IS, follow up CXR 6/18 without PTX, ?loculated effusion in periphery. On room air. ABL anemia - s/p 1 u RBC 6/17, hgb stable 10.5 C-collar - significant motion on CT, but no fx, unable to participate in clinical exam, likely not going to sit still for MRI if unable to sit still enough for CT. Flex ex performed yesterday and c collar was removed. FEN - Reg,  VTE - SCD's, start LMWH today.  ID - Tdap given in ED Admit - 4NP, PT/OT/SLP, anticipate SNF AM labs IS/flutter valve as able        LOS: 2 days   I reviewed nursing notes, last 24 h vitals and pain scores, last 48 h intake and output, last 24 h labs and trends, and last 24 h imaging results.  This care required moderate level of medical decision making.   Hosie Spangle, PA-C [facility] Please see Amion for pager number during day hours 7:00am-4:30pm

## 2023-01-06 NOTE — Evaluation (Signed)
Speech Language Pathology Evaluation Patient Details Name: Omar Kennedy MRN: [id number] DOB: [birth year] Today's Date: 01/06/2023 Time: 8657-8469 SLP Time Calculation (min) (ACUTE ONLY): 16 min  Problem List:  Patient Active Problem List   Diagnosis Date Noted   Fall from height of greater than 3 feet 01/04/2023   Past Medical History: History reviewed. No pertinent past medical history. Past Surgical History: History reviewed. No pertinent surgical history. HPI:  Omar Kennedy. Schoff is a 77 y/o M with a PMH dementia who presented to the ED as a level 2 trauma after he was found down in an empty swimming pool behind his house. Sustained SAH/SDH, T11 fx, multiple thoracic and lumbar spine transverse process fractures, right clavicle and left scapula fracture, R HTX, R rib fx 5-6   Assessment / Plan / Recommendation Clinical Impression  Spoke with wife via phone who stated pt had word finding and difficulty comprehending prior to admission however "it is worse now" and couldn't understand what he was trying to say last night. He demonstrates cognitive-linguistic impairments and is expressive mostly in short phrases with paraphasias noted. He is drowsy with poor attention and internally distracted by pain and posey belt. He demonstrates difficulty in sustained attention, problem solving, awareness, expression and comprehension. ST will work with pt to assist in cognition, comprehension and making his needs known.    SLP Assessment  SLP Recommendation/Assessment: Patient needs continued Speech Lanaguage Pathology Services SLP Visit Diagnosis: Cognitive communication deficit (R41.841)    Recommendations for follow up therapy are one component of a multi-disciplinary discharge planning process, led by the attending physician.  Recommendations may be updated based on patient status, additional functional criteria and insurance authorization.    Follow Up Recommendations  Skilled nursing-short term  rehab (<3 hours/day)    Assistance Recommended at Discharge  Frequent or constant Supervision/Assistance  Functional Status Assessment Patient has had a recent decline in their functional status and demonstrates the ability to make significant improvements in function in a reasonable and predictable amount of time.  Frequency and Duration min 2x/week  2 weeks      SLP Evaluation Cognition  Overall Cognitive Status: Impaired/Different from baseline (cognitive and communication deficits prior but worse now per spouse) Arousal/Alertness: Awake/alert (drowsy) Orientation Level: Oriented to person;Disoriented to situation;Disoriented to time;Disoriented to place Attention: Sustained Sustained Attention: Impaired Sustained Attention Impairment: Verbal basic Memory:  (deficits at baseline) Awareness: Impaired Awareness Impairment: Intellectual impairment;Emergent impairment;Anticipatory impairment Problem Solving: Impaired Problem Solving Impairment: Verbal basic;Functional basic Safety/Judgment: Impaired       Comprehension  Auditory Comprehension Overall Auditory Comprehension: Impaired Commands: Impaired One Step Basic Commands: 75-100% accurate Interfering Components: Attention Visual Recognition/Discrimination Discrimination: Not tested Reading Comprehension Reading Status: Not tested    Expression Expression Primary Mode of Expression: Verbal Verbal Expression Overall Verbal Expression: Impaired Initiation: No impairment Level of Generative/Spontaneous Verbalization: Phrase Repetition:  (NT) Naming: Impairment Confrontation: Impaired Divergent:  (named 2 animals one minute) Verbal Errors: Phonemic paraphasias;Semantic paraphasias;Language of confusion Pragmatics: Impairment Impairments: Eye contact Interfering Components: Attention Written Expression Written Expression: Not tested   Oral / Motor  Oral Motor/Sensory Function Overall Oral Motor/Sensory Function:   (did not follow commands for labial retraction. No focal weakness) Motor Speech Respiration: Within functional limits Phonation: Normal Resonance: Within functional limits Articulation: Within functional limitis Intelligibility: Intelligible Motor Planning: Witnin functional limits            Royce Macadamia 01/06/2023, 9:56 AM

## 2023-01-06 NOTE — Progress Notes (Signed)
PT Cancellation Note  Patient Details Name: Omar Kennedy MRN: [id number] DOB: 1946/06/13   Cancelled Treatment:    Reason Eval/Treat Not Completed: (P) Patient declined, no reason specified (Attempted bed mobility, but pt stating "no", resisting movement, and becoming agitated with moving bed covers and any position adjustments. Will continue to follow per PT POC.)   Johny Shock 01/06/2023, 2:31 PM

## 2023-01-06 NOTE — Progress Notes (Signed)
Messaged attending about gathering labs and adding fluids on patient.

## 2023-01-06 NOTE — Progress Notes (Signed)
Patient ID: Omar Kennedy, male   DOB: Oct 13, 1945, 77 y.o.   MRN: [id number] Resting quietly this morning.  No change in exam.  Following.

## 2023-01-06 NOTE — Progress Notes (Signed)
Speech Language Pathology Treatment: Dysphagia  Patient Details Name: Omar Kennedy MRN: [id number] DOB: 05-Jun-1946 Today's Date: 01/06/2023 Time: 5956-3875 SLP Time Calculation (min) (ACUTE ONLY): 15 min  Assessment / Plan / Recommendation Clinical Impression  Pt seen for swallow intervention. He was drowsy but slowly became more alert with stimulation. Unlike yesterday today he is demonstrating signs of aspiration with intermittent immediate coughing with liquids with thin liquids. Wife called at home and stated he coughed with her last night with liquids and would cough at home during meals at times. Masticated cracker timely and without oral residue. SLP will modify liquids to nectar thick and continue regular texture and notified wife of change. ST will continue to follow. Presently MBS not recommended but will continue to determine if needed or if able to upgrade at bedside.     HPI HPI: Omar Kennedy. Omar Kennedy is a 77 y/o M with a PMH dementia who presented to the ED as a level 2 trauma after he was found down in an empty swimming pool behind his house. Sustained SAH/SDH, T11 fx, multiple thoracic and lumbar spine transverse process fractures, right clavicle and left scapula fracture, R HTX, R rib fx 5-6      SLP Plan  Continue with current plan of care      Recommendations for follow up therapy are one component of a multi-disciplinary discharge planning process, led by the attending physician.  Recommendations may be updated based on patient status, additional functional criteria and insurance authorization.    Recommendations  Diet recommendations: Regular;Nectar-thick liquid Liquids provided via: Cup;Straw Medication Administration: Crushed with puree Supervision: Staff to assist with self feeding;Full supervision/cueing for compensatory strategies Compensations: Minimize environmental distractions;Slow rate;Small sips/bites Postural Changes and/or Swallow Maneuvers: Out of bed for  meals                  Oral care BID   Frequent or constant Supervision/Assistance Dysphagia, unspecified (R13.10)     Continue with current plan of care     Royce Macadamia  01/06/2023, 9:33 AM

## 2023-01-07 ENCOUNTER — Inpatient Hospital Stay (HOSPITAL_COMMUNITY): Payer: Medicare PPO

## 2023-01-07 DIAGNOSIS — S065XAA Traumatic subdural hemorrhage with loss of consciousness status unknown, initial encounter: Secondary | ICD-10-CM | POA: Diagnosis not present

## 2023-01-07 DIAGNOSIS — F03918 Unspecified dementia, unspecified severity, with other behavioral disturbance: Secondary | ICD-10-CM

## 2023-01-07 DIAGNOSIS — W1789XA Other fall from one level to another, initial encounter: Secondary | ICD-10-CM | POA: Diagnosis not present

## 2023-01-07 LAB — BASIC METABOLIC PANEL
Anion gap: 6 (ref 5–15)
BUN: 31 mg/dL — ABNORMAL HIGH (ref 8–23)
CO2: 24 mmol/L (ref 22–32)
Calcium: 8.4 mg/dL — ABNORMAL LOW (ref 8.9–10.3)
Chloride: 109 mmol/L (ref 98–111)
Creatinine, Ser: 1.25 mg/dL — ABNORMAL HIGH (ref 0.61–1.24)
GFR, Estimated: 59 mL/min — ABNORMAL LOW (ref >60)
Glucose, Bld: 126 mg/dL — ABNORMAL HIGH (ref 70–99)
Potassium: 4 mmol/L (ref 3.5–5.1)
Sodium: 139 mmol/L (ref 135–145)

## 2023-01-07 LAB — CBC
HCT: 25.4 % — ABNORMAL LOW (ref 39.0–52.0)
Hemoglobin: 8.5 g/dL — ABNORMAL LOW (ref 13.0–17.0)
MCH: 30.6 pg (ref 26.0–34.0)
MCHC: 33.5 g/dL (ref 30.0–36.0)
MCV: 91.4 fL (ref 80.0–100.0)
Platelets: 158 10*3/uL (ref 150–400)
RBC: 2.78 MIL/uL — ABNORMAL LOW (ref 4.22–5.81)
RDW: 14.9 % (ref 11.5–15.5)
WBC: 12.4 10*3/uL — ABNORMAL HIGH (ref 4.0–10.5)
nRBC: 0 % (ref 0.0–0.2)

## 2023-01-07 MED ORDER — LEVETIRACETAM 500 MG PO TABS
500.0000 mg | ORAL_TABLET | Freq: Two times a day (BID) | ORAL | Status: AC
  Administered 2023-01-07 – 2023-01-11 (×8): 500 mg via ORAL
  Filled 2023-01-07 (×8): qty 1

## 2023-01-07 MED ORDER — SODIUM CHLORIDE 0.9 % IV BOLUS
500.0000 mL | Freq: Once | INTRAVENOUS | Status: AC
  Administered 2023-01-07: 500 mL via INTRAVENOUS

## 2023-01-07 MED ORDER — LACTATED RINGERS IV SOLN: INTRAVENOUS | Status: DC

## 2023-01-07 MED ORDER — OXYCODONE HCL 5 MG PO TABS
2.5000 mg | ORAL_TABLET | ORAL | Status: DC | PRN
  Administered 2023-01-07 – 2023-01-21 (×11): 5 mg via ORAL
  Filled 2023-01-07 (×13): qty 1

## 2023-01-07 NOTE — Evaluation (Signed)
Occupational Therapy Evaluation Patient Details Name: Omar Kennedy MRN: [id number] DOB: [birth year] Today's Date: 01/07/2023   History of Present Illness Pt is 77 yo male who presents on 01/04/23 after falling into empty pool. Sustained R scalp laceration, T11 fx, multiple T/L TP fxs, SAH/ SDH, R chest wall hematoma, R clavicle fx (NWB, sling), L scapula fx (WBAT), R rib fxs 5-6, R HTX. PMH: dementia   Clinical Impression   Pt admitted for above dx, pt is a poor historian and OT unable to obtain PLOF or home information as no family is available at time of IE. Pt currently limited by pain and agitation, requires Max A-Total A +2 for bed mobility, once EOB pt not following precautions and needs manual facilitation for proper sitting position. Deferred OOB mobility due to pain, cognition, and behavior. Pt would benefit from continued acute skilled OT services in efforts to address deficits and progress with UE ROM. Patient would benefit from post acute skilled rehab facility with <3 hours of therapy and 24/7 support       Recommendations for follow up therapy are one component of a multi-disciplinary discharge planning process, led by the attending physician.  Recommendations may be updated based on patient status, additional functional criteria and insurance authorization.   Assistance Recommended at Discharge    Patient can return home with the following Two people to help with walking and/or transfers;A lot of help with bathing/dressing/bathroom;Assistance with cooking/housework;Direct supervision/assist for financial management;Direct supervision/assist for medications management;Assistance with feeding;Assist for transportation;Help with stairs or ramp for entrance    Functional Status Assessment  Patient has had a recent decline in their functional status and demonstrates the ability to make significant improvements in function in a reasonable and predictable amount of time.  Equipment  Recommendations  None recommended by OT (defer to next level of care)    Recommendations for Other Services       Precautions / Restrictions Precautions Precautions: Back;Fall Precaution Booklet Issued: No Precaution Comments: pt unable to understand or retain precautions, will need cueing every time Required Braces or Orthoses: Sling;Spinal Brace Spinal Brace: Lumbar corset;Applied in sitting position Restrictions Weight Bearing Restrictions: Yes RUE Weight Bearing: Non weight bearing LUE Weight Bearing: Weight bear through elbow only      Mobility Bed Mobility Overal bed mobility: Needs Assistance Bed Mobility: Rolling, Sidelying to Sit, Sit to Sidelying Rolling: Total assist Sidelying to sit: Mod assist, +2 for physical assistance     Sit to sidelying: Total assist, +2 for physical assistance General bed mobility comments: Pt resisting movement, needing more assist with bed mobility    Transfers                   General transfer comment: unable to attempt due to poor sitting balance and pain      Balance Overall balance assessment: Needs assistance Sitting-balance support: Single extremity supported, Feet supported Sitting balance-Leahy Scale: Zero Sitting balance - Comments: Pt needing support to maintain a sitting position, strong anterior lean and L lean. Pt resistive to movement and efforts of tactile cueing and OT facilitated weight shifts Postural control: Left lateral lean, Other (comment) (anterior lean)     Standing balance comment: NT                           ADL either performed or assessed with clinical judgement   ADL   Eating/Feeding: Moderate assistance;Bed level   Grooming: Bed level;Maximal assistance  Upper Body Bathing: Maximal assistance;Bed level   Lower Body Bathing: Bed level;Total assistance   Upper Body Dressing : Maximal assistance;Bed level   Lower Body Dressing: Total assistance;Bed level                        Vision         Perception     Praxis      Pertinent Vitals/Pain Pain Assessment Pain Assessment: Faces Faces Pain Scale: Hurts even more Pain Location: generalized, R shoulder, LLE Pain Descriptors / Indicators: Discomfort, Grimacing, Moaning Pain Intervention(s): Monitored during session, Repositioned     Hand Dominance     Extremity/Trunk Assessment Upper Extremity Assessment Upper Extremity Assessment: RUE deficits/detail;Difficult to assess due to impaired cognition;LUE deficits/detail RUE Deficits / Details: NWB, pt not following commands appropriately, seems to possess the ROM [facility] for elb flexion and ext LUE Deficits / Details: Hard to fully assess due to impaired cog, posess strength to pull with bed mobility and ROM to touch face           Communication Communication Communication: Expressive difficulties;Receptive difficulties   Cognition Arousal/Alertness: Lethargic Behavior During Therapy: Anxious, Agitated Overall Cognitive Status: Impaired/Different from baseline                                 General Comments: Pt agitated throughout session and distracted by pain. Pt making nonsensical statements throughout.     General Comments       Exercises     Shoulder Instructions      Home Living Family/patient expects to be discharged to:: Skilled nursing facility     Type of Home: House                           Additional Comments: pt from home with wife who is currently in a w/c due to an ankle fx per chart. Pt unable to give any history or PLOF  Lives With: Spouse    Prior Functioning/Environment Prior Level of Function : Patient poor historian/Family not available             Mobility Comments: unsure but was ambulatory ADLs Comments: unsure        OT Problem List: Pain;Decreased strength;Impaired balance (sitting and/or standing)      OT Treatment/Interventions: Self-care/ADL  training;Balance training;Therapeutic exercise;Patient/family education;Therapeutic activities    OT Goals(Current goals can be found in the care plan section) Acute Rehab OT Goals Patient Stated Goal: none stated OT Goal Formulation: Patient unable to participate in goal setting Time For Goal Achievement: 01/21/23 Potential to Achieve Goals: Good ADL Goals Pt Will Perform Grooming: sitting;with min assist Additional ADL Goal #1: Pt will demonstrate compliance with NWB RUE with Min cueing Additional ADL Goal #2: Pt will complete log rolling to sitting position with Mod A  OT Frequency: Min 1X/week    Co-evaluation PT/OT/SLP Co-Evaluation/Treatment: Yes Reason for Co-Treatment: Necessary to address cognition/behavior during functional activity;For patient/therapist safety;To address functional/ADL transfers PT goals addressed during session: Mobility/safety with mobility;Balance OT goals addressed during session: Strengthening/ROM      AM-PAC OT "6 Clicks" Daily Activity     Outcome Measure Help from another person eating meals?: A Lot Help from another person taking care of personal grooming?: A Lot Help from another person toileting, which includes using toliet, bedpan, or urinal?: Total Help from another person bathing (  including washing, rinsing, drying)?: A Lot Help from another person to put on and taking off regular upper body clothing?: A Lot Help from another person to put on and taking off regular lower body clothing?: Total 6 Click Score: 10   End of Session Nurse Communication: Need for lift equipment  Activity Tolerance: Patient limited by lethargy;Treatment limited secondary to agitation Patient left: in bed;with call bell/phone within reach;with bed alarm set;Other (comment) (R mitt in place)  OT Visit Diagnosis: Muscle weakness (generalized) (M62.81);Pain;History of falling (Z91.81)                Time: 4098-1191 OT Time Calculation (min): 25 min Charges:  OT  General Charges $OT Visit: 1 Visit OT Evaluation $OT Eval Moderate Complexity: 1 Mod  01/07/2023  AB, OTR/L  Acute Rehabilitation Services  Office: [phone]   [name] 01/07/2023, 11:18 AM

## 2023-01-07 NOTE — Consult Note (Signed)
Neurology Consultation    Reason for Consult:Altered mental status  CC: Scalp laceration, hypotension, fall  HISTORY OF PRESENT ILLNESS   HPI   Chart set to merge with [id number]   Omar Kennedy is a 77 y.o. male with a past medical history of CAD s/p CABG, hypertension, hyperlipidemia, obstructive sleep apnea, mixed dementia who presented as a trauma after being found in an empty swimming pool. On arrival to the ED he was hypotensive with a scalp laceration and a right anterior chest wall contusion. Head CT showed a left subdural hematoma and minor traumatic SAH. NSGY saw him in the ED and started keppra 500mg  BID, plan for standard 1 week course. He currently follows with Dr. 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 Most recent SLUMs 18/30 in 2021. He is currently on seroquel, doxepin, escitalopram and rivastigmine for his dementia, behavior changes, and insomnia which have been restarted.   There has been no witnessed seizure-like activity but he has remained confused and far from his baseline, which family does understand may be secondary to his brain injury but they would like to rule out seizures or any other treatable causes such as medication effects  History is obtained from:Chart review  ROS: A comprehensive review of systems was negative.   PAST MEDICAL HISTORY    Past Medical History:  History reviewed. No pertinent past medical history.  No family history on file. History reviewed. No pertinent family history.  Allergies:  Allergies  Allergen Reactions   Cephalexin Other (See Comments)    vertigo    Social History:   has no history on file for tobacco use, alcohol use, and drug use.    Medications Medications Prior to Admission  Medication Sig Dispense Refill   acetaminophen (TYLENOL) 500 MG tablet Take 1,000 mg by mouth every 6 (six) hours as needed for headache.     amLODipine (NORVASC) 5 MG  tablet Take 5 mg by mouth daily.     aspirin EC 81 MG tablet Take 81 mg by mouth daily. Swallow whole.     calcium carbonate (OS-CAL) 1250 (500 Ca) MG chewable tablet Chew 1 tablet by mouth daily.     carvedilol (COREG) 12.5 MG tablet Take 6.25 mg by mouth daily.     cetirizine (ZYRTEC) 10 MG tablet Take 10 mg by mouth daily.     cholecalciferol (VITAMIN D3) 25 MCG (1000 UNIT) tablet Take 1,000 Units by mouth daily. gummies     escitalopram (LEXAPRO) 5 MG tablet Take 5 mg by mouth daily.     fluticasone (FLONASE) 50 MCG/ACT nasal spray Place 1 spray into both nostrils every evening.     Multiple Vitamins-Minerals (HAIR SKIN AND NAILS FORMULA PO) Take 1 tablet by mouth daily.     pantoprazole (PROTONIX) 40 MG tablet Take 40 mg by mouth at bedtime.     rivastigmine (EXELON) 3 MG capsule Take 3 mg by mouth 2 (two) times daily.     rosuvastatin (CRESTOR) 40 MG tablet Take 40 mg by mouth at bedtime.     vitamin B-12 (CYANOCOBALAMIN) 100 MCG tablet Take 100 mcg by mouth daily. gummies      EXAMINATION    Current vital signs:    01/07/2023    9:23 AM 01/07/2023    5:31 AM 01/06/2023    8:22 PM  Vitals with BMI  Systolic 139 154 782  Diastolic 59 71 65  Pulse 80 90 86  Examination:  GENERAL: Awake, alert in NAD HEENT: - Normocephalic and atraumatic, dry mm, no lymphadenopathy, no Thyromegally LUNGS - Regular, unlabored respirations CV - S1S2 RRR, equal pulses bilaterally. ABDOMEN - Soft, nontender, nondistended with normoactive BS Ext: warm, well perfused, intact peripheral pulses, no pedal edema Integumentary:  Bruising to anterior chest and bilateral arms, laceration to right forehead- dressing CDI  NEURO:  Mental Status: Alert and oriented to self. Disoriented to time, place, and situation. Poor historian.  Language: speech is clear.   Cranial Nerves:  II: PERRL. Blinks to threat bilaterally.  III, IV, VI: Seems to have a left gaze preference  VII: mild facial asymmetry  VIII:  hearing intact to voice IX, X: Palate elevates symmetrically. Phonation is normal.  ZO:XWRU is midline XII: tongue is midline without fasciculations. Motor:  bilateral upper extremities strong with a preference of using his left hand  bilateral lower extremities move relatively equally but he is resistant to movement in his lower extremities  Tone: is normal and bulk is normal DTRs: 3+ and symmetrical throughout   Sensation- Not cooperative to sensory exam  Coordination: Accurate with movement of upper extremities while feeding himself, not cooperative with HKS Gait- deferred    LABS   I have reviewed labs in epic and the results pertinent to this consultation are:   No results found for: "[facility]" Lab Results  Component Value Date   ALT 40 01/04/2023   AST 69 (H) 01/04/2023   ALKPHOS 52 01/04/2023   BILITOT 2.4 (H) 01/04/2023   No results found for: "HGBA1C" Lab Results  Component Value Date   WBC 12.4 (H) 01/07/2023   HGB 8.5 (L) 01/07/2023   HCT 25.4 (L) 01/07/2023   MCV 91.4 01/07/2023   PLT 158 01/07/2023   No results found for: "VITAMINB12" No results found for: "FOLATE" Lab Results  Component Value Date   NA 139 01/07/2023   K 4.0 01/07/2023   CL 109 01/07/2023   CO2 24 01/07/2023     DIAGNOSTIC IMAGING/PROCEDURES   I have reviewed the images obtained:, as below    CT-head 1. Acute 5 mm left cerebral convexity subdural hematoma. No significant midline shift. 2. Small volume subarachnoid hemorrhage along the anterior lateral left temporal lobe. No evidence of intraventricular extension. 3. Soft tissue hematoma and laceration along the right parietal scalp. No evidence of underlying calvarial fracture.  CT- head Mild increase in subdural hematoma along the left cerebral convexity measuring up to 5 mm in thickness. Unchanged subarachnoid hemorrhage especially along the left temporal convexity.  EEG   ASSESSMENT/PLAN    Assessment:  77 y.o. male  with a pertinent medical history of dementia, insomnia who presented as a trauma after being found in an empty swimming pool. On arrival to the ED he was hypotensive with a scalp laceration and a right anterior chest wall contusion. Head CT showed a left subdural hematoma and minor traumatic SAH. NSGY saw him in the ED and started keppra 500mg  BID for 7 day course; discussed with daughter that Keppra may be playing a role in his sleepiness as well, but a 7-day course is standard to protect against posttraumatic seizures.  We discussed that he would be at a longer term risk of seizures but if there is no seizure activity on EEG would recommend stopping Keppra as planned at 7 days.  We will do a prolonged EEG for as long as patient allows (hookup as a long-term EEG), and then plan to do MRI after  EEG is completed to more fully characterize the extent of brain injury  Impression: Late onset mixed dementia  Left subdural hemorrhage Delirium likely secondary to pain, head injury, hospitalization, medication effects Potential for subclinical seizures   Recommendations: - Continue keppra 500mg  BID to complete 7 day course - Continue home medication regimen, would not make changes at this time - LTM EEG, if patient self discontinues EEG we will likely not re-hook overnight unless seizure activity has already been captured - MRI brain after EEG  **This documentation was dictated using Dragon Medical Software and may contain inadvertent errors **  Patient seen and examined by NP/APP with MD. MD to update note as needed.   Elmer Picker, DNP, FNP-BC [facility] Pager: [phone]  Attending Neurologist's note:  I personally saw this patient, gathering history, performing a full neurologic examination, reviewing relevant labs, personally reviewing relevant imaging including head CT, and formulated the assessment and plan, adding the note above for completeness and clarity to accurately  reflect my thoughts.  I personally discussed plan with daughter; tried to reach wife but her phone was switched off at the time due to upgrade  Brooke Dare MD-PhD [facility] [phone]  Available 7 AM to 7 PM, outside these hours please contact Neurologist on call listed on AMION

## 2023-01-07 NOTE — Progress Notes (Signed)
[facility] Progress Note     Subjective: CC:  Alert and chatting with RN this morning. Expressive aphasia.  Expresses concern about sitting up due to pain.   Objective: Vital signs in last 24 hours: Temp:  [97.4 F (36.3 C)-98.4 F (36.9 C)] 97.4 F (36.3 C) (06/20 0531) Pulse Rate:  [72-90] 90 (06/20 0531) Resp:  [16-20] 18 (06/20 0531) BP: (132-154)/(63-71) 154/71 (06/20 0531) SpO2:  [90 %-96 %] 90 % (06/20 0531) Last BM Date : 01/06/23  Intake/Output from previous day: 06/19 0701 - 06/20 0700 In: 310 [P.O.:210; IV Piggyback:100] Out: 700 [Urine:700] Intake/Output this shift: No intake/output data recorded.  PE: Gen: comfortable, no distress Head: R scalp lac c/d/I, no cellulitis or purulence  Neuro: oriented to self only HEENT: PERRL Neck: contusion over R neck. CV: RRR Pulm: chest wall with evolution of hematoma on the right - monitor. unlabored breathing on room air Abd: soft, NT    GU: urine clear and dark yellow, +spontaneous voids Extr: wwp, no edema  Lab Results:  Recent Labs    01/05/23 0307 01/07/23 0022  WBC 15.4* 12.4*  HGB 10.5* 8.5*  HCT 30.7* 25.4*  PLT 136* 158   BMET Recent Labs    01/05/23 0307 01/07/23 0022  NA 141 139  K 4.7 4.0  CL 109 109  CO2 22 24  GLUCOSE 154* 126*  BUN 26* 31*  CREATININE 1.66* 1.25*  CALCIUM 8.2* 8.4*   PT/INR Recent Labs    01/04/23 1412  LABPROT 19.9*  INR 1.7*   CMP     Component Value Date/Time   NA 139 01/07/2023 0022   K 4.0 01/07/2023 0022   CL 109 01/07/2023 0022   CO2 24 01/07/2023 0022   GLUCOSE 126 (H) 01/07/2023 0022   BUN 31 (H) 01/07/2023 0022   CREATININE 1.25 (H) 01/07/2023 0022   CALCIUM 8.4 (L) 01/07/2023 0022   PROT 4.9 (L) 01/04/2023 1412   ALBUMIN 3.2 (L) 01/04/2023 1412   AST 69 (H) 01/04/2023 1412   ALT 40 01/04/2023 1412   ALKPHOS 52 01/04/2023 1412   BILITOT 2.4 (H) 01/04/2023 1412   GFRNONAA 59 (L) 01/07/2023 0022   Lipase  No results found  for: "LIPASE"     Studies/Results: DG Cerv Spine Flex&Ext Only  Result Date: 01/05/2023 CLINICAL DATA:  Trauma, pain EXAM: CERVICAL SPINE - FLEXION AND EXTENSION VIEWS ONLY COMPARISON:  CT done on 01/04/2023 FINDINGS: C7 vertebra is not optimally visualized in the flexion and extension lateral views. No fracture is seen in the visualized bony structures. Alignment of posterior margins of vertebral bodies is unremarkable in the extension lateral view. In the flexion lateral view, there is possible minimal 1 mm anterolisthesis at C4-C5 level. Prevertebral soft tissues are unremarkable. IMPRESSION: C7 vertebrae is not included in the images and not evaluated. No fracture is seen in the visualized portions of the cervical spine. In the flexion lateral view, there is minimal 1 mm anterolisthesis at C4-C5 level. Alignment of posterior vertebral bodies appears normal in the extension lateral view. Electronically Signed   By: Ernie Avena M.D.   On: 01/05/2023 14:52    Anti-infectives: Anti-infectives (From admission, onward)    Start     Dose/Rate Route Frequency Ordered Stop   01/04/23 1530  ceFAZolin (ANCEF) IVPB 2g/100 mL premix        2 g 200 mL/hr over 30 Minutes Intravenous  Once 01/04/23 1526 01/04/23 1634       Assessment/Plan  Found down in an empty swimming pool    R scalp laceration - closed in ED with 9 staples on 6/17 T11 fracture, multiple thoracic and lumbar spine transverse process fractures - per NS c/s, mobilize in brace SAH/SDH - NS c/s Dr. Yetta Barre, repeat CT stable 6/18, IV keppra x 7 days R chest wall hematoma  R clavicle fracture - ortho c/s, sling, NWB L scapula fracture - ortho c/s, WBAT R HTX, R Fib FX 5-6 - no PTX on CT. Multimodal pain control, IS, follow up CXR 6/18 without PTX, ?loculated effusion in periphery. On room air.  ABL anemia - s/p 1 u RBC 6/17, hgb 8.5 from 10.5. HTX noted on CXR 6/18 may explain his drop in hgb. He remains on room air with stable  vitals and has no signs of increased bleeding in his soft tissues. Lovenox was started yesterday, will continue.  C-collar - significant motion on CT, but no fx, unable to participate in clinical exam, likely not going to sit still for MRI if unable to sit still enough for CT. Flex ex performed 6/18 and c collar was removed.  FEN - Reg, give 500 mL bolus today due to poor PO intake reported by family and RN.  VTE - SCD's, lovenox started 6/19. ID - Tdap given in ED Admit - 4NP, PT/OT/SLP, anticipate SNF AM labs IS/flutter valve as able        LOS: 3 days   I reviewed nursing notes, last 24 h vitals and pain scores, last 48 h intake and output, last 24 h labs and trends, and last 24 h imaging results.  This care required moderate level of medical decision making.   Hosie Spangle, PA-C [facility] Please see Amion for pager number during day hours 7:00am-4:30pm

## 2023-01-07 NOTE — NC FL2 (Signed)
[place] MEDICAID FL2 LEVEL OF CARE FORM     IDENTIFICATION  Patient Name: Omar Kennedy Birthdate: 1945/11/16 Sex: male Admission Date (Current Location): 01/04/2023  [facility] and [place] Number:  Producer, television/film/video and Address:  [facility] [place], [address] [place], [place] [address]      Provider Number: [id number]  Attending Physician Name and Address:  Md, Trauma, MD  Relative Name and Phone Number:  Ilhan, Escue   [phone]    Current Level of Care: Hospital Recommended Level of Care: Skilled Nursing Facility Prior Approval Number:    Date Approved/Denied:   PASRR Number: [id number]  Discharge Plan: SNF    Current Diagnoses: Patient Active Problem List   Diagnosis Date Noted   Fall from height of greater than 3 feet 01/04/2023    Orientation RESPIRATION BLADDER Height & Weight     Self, Place  Normal Incontinent, External catheter Weight: 169 lb 15.6 oz (77.1 kg) Height:  5\' 10"  (177.8 cm)  BEHAVIORAL SYMPTOMS/MOOD NEUROLOGICAL BOWEL NUTRITION STATUS      Incontinent Diet (see discharge summary)  AMBULATORY STATUS COMMUNICATION OF NEEDS Skin   Total Care Verbally Skin abrasions, Other (Comment) (hematoma, redness)                       Personal Care Assistance Level of Assistance  Bathing, Feeding, Dressing Bathing Assistance: Maximum assistance Feeding assistance: Limited assistance Dressing Assistance: Maximum assistance     Functional Limitations Info  Sight, Hearing, Speech Sight Info: Adequate Hearing Info: Impaired Speech Info: Adequate    SPECIAL CARE FACTORS FREQUENCY  PT (By licensed PT), OT (By licensed OT)     PT Frequency: 5x week OT Frequency: 5x week            Contractures Contractures Info: Not present    Additional Factors Info  Code Status, Allergies, Psychotropic Code Status Info: full Allergies Info: cephalexin Psychotropic Info: seroquel         Current Medications  (01/07/2023):  This is the current hospital active medication list Current Facility-Administered Medications  Medication Dose Route Frequency Provider Last Rate Last Admin   0.9 %  sodium chloride infusion (Manually program via Guardrails IV Fluids)   Intravenous Once Ernie Avena, MD       acetaminophen (TYLENOL) tablet 1,000 mg  1,000 mg Oral Q6H Simaan, Elizabeth S, PA-C   1,000 mg at 01/07/23 0559   calcium carbonate (OS-CAL - dosed in mg of elemental calcium) tablet 1,250 mg  1,250 mg Oral Q breakfast Diamantina Monks, MD   1,250 mg at 01/07/23 0854   carvedilol (COREG) tablet 6.25 mg  6.25 mg Oral BID WC Diamantina Monks, MD   6.25 mg at 01/07/23 0854   Chlorhexidine Gluconate Cloth 2 % PADS 6 each  6 each Topical Daily Diamantina Monks, MD   6 each at 01/07/23 0856   cholecalciferol (VITAMIN D3) 25 MCG (1000 UNIT) tablet 1,000 Units  1,000 Units Oral Daily Diamantina Monks, MD   1,000 Units at 01/07/23 0854   docusate sodium (COLACE) capsule 100 mg  100 mg Oral BID Adam Phenix, PA-C   100 mg at 01/07/23 0854   enoxaparin (LOVENOX) injection 30 mg  30 mg Subcutaneous Q12H Diamantina Monks, MD   30 mg at 01/06/23 2045   escitalopram (LEXAPRO) tablet 5 mg  5 mg Oral Daily Diamantina Monks, MD   5 mg at 01/07/23 [phone]  fluticasone (FLONASE) 50 MCG/ACT nasal spray 1 spray  1 spray Each Nare QPM Diamantina Monks, MD       hydrALAZINE (APRESOLINE) injection 10 mg  10 mg Intravenous Q2H PRN Adam Phenix, PA-C       levETIRAcetam (KEPPRA) IVPB 500 mg/100 mL premix  500 mg Intravenous Q12H Adam Phenix, PA-C 400 mL/hr at 01/07/23 0914 500 mg at 01/07/23 0914   lidocaine (LIDODERM) 5 % 2 patch  2 patch Transdermal Q24H Adam Phenix, PA-C   2 patch at 01/05/23 1535   loratadine (CLARITIN) tablet 10 mg  10 mg Oral Daily Diamantina Monks, MD   10 mg at 01/07/23 0854   metoprolol tartrate (LOPRESSOR) injection 5 mg  5 mg Intravenous Q6H PRN Adam Phenix, PA-C        multivitamin with minerals tablet 1 tablet  1 tablet Oral Daily Diamantina Monks, MD   1 tablet at 01/07/23 0854   ondansetron (ZOFRAN-ODT) disintegrating tablet 4 mg  4 mg Oral Q6H PRN Adam Phenix, PA-C       Or   ondansetron (ZOFRAN) injection 4 mg  4 mg Intravenous Q6H PRN Adam Phenix, PA-C       Oral care mouth rinse  15 mL Mouth Rinse 4 times per day Diamantina Monks, MD   15 mL at 01/07/23 0857   Oral care mouth rinse  15 mL Mouth Rinse PRN Diamantina Monks, MD       polyethylene glycol (MIRALAX / GLYCOLAX) packet 17 g  17 g Oral Daily PRN Simaan, Elizabeth S, PA-C       QUEtiapine (SEROQUEL) tablet 25 mg  25 mg Oral Sadie Haber, MD   25 mg at 01/05/23 2355   rivastigmine (EXELON) capsule 3 mg  3 mg Oral BID Diamantina Monks, MD   3 mg at 01/07/23 0915   rosuvastatin (CRESTOR) tablet 40 mg  40 mg Oral QHS Diamantina Monks, MD   40 mg at 01/06/23 2055   traMADol (ULTRAM) tablet 50 mg  50 mg Oral Q6H PRN Adam Phenix, PA-C   50 mg at 01/07/23 1610   vitamin B-12 (CYANOCOBALAMIN) tablet 100 mcg  100 mcg Oral Daily Diamantina Monks, MD   100 mcg at 01/07/23 9604     Discharge Medications: Please see discharge summary for a list of discharge medications.  Relevant Imaging Results:  Relevant Lab Results:   Additional Information SSN: [id number]  Lorri Frederick, LCSW

## 2023-01-07 NOTE — Progress Notes (Signed)
Patient currently drinking on his own. No mittens or restraints. Patient is smiling and talking with me. He said his sides were hurting really bad and chest area. Gave him pain medication. Fluids running, bed alarm on, call light in reach.

## 2023-01-07 NOTE — Progress Notes (Signed)
Family at bedside requesting an update from the trauma team. Patient sitting up drinking. Fluids running. Patient calming back down after becoming agitated because he wanted to get out of bed. Bed alarm on. Call light and family at bedside

## 2023-01-07 NOTE — Progress Notes (Deleted)
.  LTM 

## 2023-01-07 NOTE — Care Management Important Message (Signed)
Important Message  Patient Details  Name: BLADEN BEVILACQUA MRN: [id number] Date of Birth: [birth year]   Medicare Important Message Given:  Yes     Sherilyn Banker 01/07/2023, 3:11 PM

## 2023-01-07 NOTE — Progress Notes (Signed)
Physical Therapy Treatment Patient Details Name: Omar Kennedy MRN: [id number] DOB: 10/06/1945 Today's Date: 01/07/2023   History of Present Illness Pt is 77 yo male who presents on 01/04/23 after falling into empty pool. Sustained R scalp laceration, T11 fx, multiple T/L TP fxs, SAH/ SDH, R chest wall hematoma, R clavicle fx (NWB, sling), L scapula fx (WBAT), R rib fxs 5-6, R HTX. PMH: dementia    PT Comments    Pt received in supine and reporting pain limiting mobility. Pt requiring significant assist for all mobility tasks due to pain, agitation, and pt resisting mobility. Pt requiring mod A +2- total A +2 for bed mobility due to inability to follow commands and pain. Pt able to sit EOB for ~8 mins with mod A +2 for balance due to pt leaning forward and to each side despite cues for RUE NWB. Pt unable to maintain an upright posture and resists attempts to achieve it. Pt agitated with staff and not following commands throughout session despite encouragement and redirection attempts. Pt continues to benefit from PT services to progress toward functional mobility goals.    Recommendations for follow up therapy are one component of a multi-disciplinary discharge planning process, led by the attending physician.  Recommendations may be updated based on patient status, additional functional criteria and insurance authorization.     Assistance Recommended at Discharge Frequent or constant Supervision/Assistance  Patient can return home with the following A lot of help with walking and/or transfers;A lot of help with bathing/dressing/bathroom;Assistance with cooking/housework;Assistance with feeding;Direct supervision/assist for medications management;Direct supervision/assist for financial management;Assist for transportation;Help with stairs or ramp for entrance   Equipment Recommendations  Other (comment) (TBD)    Recommendations for Other Services       Precautions / Restrictions  Precautions Precautions: Back;Fall Precaution Booklet Issued: No Precaution Comments: pt unable to understand or retain precautions, will need cueing every time Required Braces or Orthoses: Sling;Spinal Brace Spinal Brace: Lumbar corset;Applied in sitting position Restrictions Weight Bearing Restrictions: Yes RUE Weight Bearing: Non weight bearing LUE Weight Bearing: Weight bear through elbow only     Mobility  Bed Mobility Overal bed mobility: Needs Assistance Bed Mobility: Rolling, Sidelying to Sit, Sit to Sidelying Rolling: Total assist Sidelying to sit: Mod assist, +2 for physical assistance     Sit to sidelying: Total assist, +2 for physical assistance General bed mobility comments: Pt requiring total A +2 for most of bed mobility due to pt resisting movement. Pt able to push up to sit EOB with LLE once initiated by therapist.    Transfers                   General transfer comment: unable to attempt due to poor sitting balance and pain        Balance Overall balance assessment: Needs assistance Sitting-balance support: Single extremity supported, Feet supported Sitting balance-Leahy Scale: Zero Sitting balance - Comments: Pt requiring constant assist to maintain sitting position and prevent forward and R leaning onto RUE. Pt resisting attempts to achieve upright posture due to pain.                                    Cognition Arousal/Alertness: Lethargic, Awake/alert Behavior During Therapy: Anxious, Agitated Overall Cognitive Status: Impaired/Different from baseline  General Comments: Pt agitated throughout session and distracted by pain. Pt making nonsensical statements throughout.        Exercises      General Comments        Pertinent Vitals/Pain Pain Assessment Pain Assessment: Faces Faces Pain Scale: Hurts even more Pain Location: generalized, R shoulder, LLE Pain Descriptors /  Indicators: Discomfort, Grimacing, Moaning Pain Intervention(s): Monitored during session, Repositioned     PT Goals (current goals can now be found in the care plan section) Acute Rehab PT Goals Patient Stated Goal: none stated PT Goal Formulation: Patient unable to participate in goal setting Time For Goal Achievement: 01/19/23 Potential to Achieve Goals: Fair Progress towards PT goals: Progressing toward goals    Frequency    Min 3X/week      PT Plan Current plan remains appropriate    Co-evaluation PT/OT/SLP Co-Evaluation/Treatment: Yes Reason for Co-Treatment: Necessary to address cognition/behavior during functional activity;For patient/therapist safety;To address functional/ADL transfers PT goals addressed during session: Mobility/safety with mobility;Balance OT goals addressed during session: Strengthening/ROM      AM-PAC PT "6 Clicks" Mobility   Outcome Measure  Help needed turning from your back to your side while in a flat bed without using bedrails?: Total Help needed moving from lying on your back to sitting on the side of a flat bed without using bedrails?: Total Help needed moving to and from a bed to a chair (including a wheelchair)?: Total Help needed standing up from a chair using your arms (e.g., wheelchair or bedside chair)?: Total Help needed to walk in hospital room?: Total Help needed climbing 3-5 steps with a railing? : Total 6 Click Score: 6    End of Session   Activity Tolerance: Patient limited by pain;Treatment limited secondary to agitation Patient left: in bed;with call bell/phone within reach;with bed alarm set Nurse Communication: Mobility status PT Visit Diagnosis: Pain;History of falling (Z91.81);Difficulty in walking, not elsewhere classified (R26.2) Pain - Right/Left: Right Pain - part of body: Shoulder     Time: 1610-9604 PT Time Calculation (min) (ACUTE ONLY): 24 min  Charges:  $Therapeutic Activity: 8-22 mins                      Johny Shock, PTA Acute Rehabilitation Services Secure Chat Preferred  [phone]    Johny Shock 01/07/2023, 11:14 AM

## 2023-01-07 NOTE — Progress Notes (Signed)
LTM EEG hooked up and running - no initial skin breakdown - push button tested - [facility] monitoring.  

## 2023-01-07 NOTE — Progress Notes (Signed)
   01/07/23 1610  Provider Notification  Provider Name/Title Sophronia Simas, MD  Date Provider Notified 01/07/23  Time Provider Notified 0410  Method of Notification Page;Call  Notification Reason Other (Comment) (Patient not eating, nor drinking.  suggested IV Fluid. Family requested as well)  Provider response No new orders (not needed, patient on reg diet. will let dayshift MD team  decide.)  Date of Provider Response 01/07/23  Time of Provider Response [phone]

## 2023-01-08 ENCOUNTER — Inpatient Hospital Stay (HOSPITAL_COMMUNITY): Payer: Medicare PPO

## 2023-01-08 DIAGNOSIS — W1789XA Other fall from one level to another, initial encounter: Secondary | ICD-10-CM | POA: Diagnosis not present

## 2023-01-08 DIAGNOSIS — R569 Unspecified convulsions: Secondary | ICD-10-CM | POA: Diagnosis not present

## 2023-01-08 DIAGNOSIS — S065XAA Traumatic subdural hemorrhage with loss of consciousness status unknown, initial encounter: Secondary | ICD-10-CM | POA: Diagnosis not present

## 2023-01-08 LAB — CBC
HCT: 22 % — ABNORMAL LOW (ref 39.0–52.0)
HCT: 24.9 % — ABNORMAL LOW (ref 39.0–52.0)
Hemoglobin: 7.3 g/dL — ABNORMAL LOW (ref 13.0–17.0)
Hemoglobin: 8.4 g/dL — ABNORMAL LOW (ref 13.0–17.0)
MCH: 29.6 pg (ref 26.0–34.0)
MCH: 30.9 pg (ref 26.0–34.0)
MCHC: 33.2 g/dL (ref 30.0–36.0)
MCHC: 33.7 g/dL (ref 30.0–36.0)
MCV: 89.1 fL (ref 80.0–100.0)
MCV: 91.5 fL (ref 80.0–100.0)
Platelets: 140 10*3/uL — ABNORMAL LOW (ref 150–400)
Platelets: 138 10*3/uL — ABNORMAL LOW (ref 150–400)
RBC: 2.47 MIL/uL — ABNORMAL LOW (ref 4.22–5.81)
RBC: 2.72 MIL/uL — ABNORMAL LOW (ref 4.22–5.81)
RDW: 14.9 % (ref 11.5–15.5)
RDW: 14.8 % (ref 11.5–15.5)
WBC: 8 10*3/uL (ref 4.0–10.5)
WBC: 8.6 10*3/uL (ref 4.0–10.5)
nRBC: 0 % (ref 0.0–0.2)
nRBC: 0 % (ref 0.0–0.2)

## 2023-01-08 LAB — BASIC METABOLIC PANEL
Anion gap: 14 (ref 5–15)
BUN: 24 mg/dL — ABNORMAL HIGH (ref 8–23)
CO2: 24 mmol/L (ref 22–32)
Calcium: 8.4 mg/dL — ABNORMAL LOW (ref 8.9–10.3)
Chloride: 104 mmol/L (ref 98–111)
Creatinine, Ser: 1.12 mg/dL (ref 0.61–1.24)
GFR, Estimated: >60 mL/min (ref >60)
Glucose, Bld: 119 mg/dL — ABNORMAL HIGH (ref 70–99)
Potassium: 3.9 mmol/L (ref 3.5–5.1)
Sodium: 142 mmol/L (ref 135–145)

## 2023-01-08 LAB — MAGNESIUM: Magnesium: 2 mg/dL (ref 1.7–2.4)

## 2023-01-08 MED ORDER — LACTATED RINGERS IV BOLUS
500.0000 mL | Freq: Once | INTRAVENOUS | Status: AC
  Administered 2023-01-08: 500 mL via INTRAVENOUS

## 2023-01-08 MED ORDER — LORAZEPAM 2 MG/ML IJ SOLN
0.5000 mg | Freq: Once | INTRAMUSCULAR | Status: AC
  Administered 2023-01-08: 0.5 mg via INTRAVENOUS
  Filled 2023-01-08: qty 1

## 2023-01-08 MED ORDER — ENOXAPARIN SODIUM 30 MG/0.3ML IJ SOSY
30.0000 mg | PREFILLED_SYRINGE | Freq: Two times a day (BID) | INTRAMUSCULAR | Status: DC
  Administered 2023-01-09 – 2023-01-21 (×24): 30 mg via SUBCUTANEOUS
  Filled 2023-01-08 (×26): qty 0.3

## 2023-01-08 NOTE — Progress Notes (Signed)
[facility] Progress Note     Subjective: Expressive aphasia.  Unable to tell me place, or time, able to tell me his name. Spoke with wife and daughter at bedside yesterday. EEG leads being removed this AM. No family present yet this AM  Objective: Vital signs in last 24 hours: Temp:  [97.3 F (36.3 C)-98.4 F (36.9 C)] 97.3 F (36.3 C) (06/21 0742) Pulse Rate:  [72-80] 75 (06/21 0742) Resp:  [17-20] 18 (06/21 0742) BP: (136-144)/(55-67) 144/67 (06/21 0742) SpO2:  [93 %-96 %] 94 % (06/21 0742) Last BM Date : 01/06/23  Intake/Output from previous day: 06/20 0701 - 06/21 0700 In: 821.7 [I.V.:821.7] Out: -  Intake/Output this shift: No intake/output data recorded.  PE: Gen: comfortable, no distress Head: R scalp lac c/d/I, no cellulitis or purulence  Neuro: oriented to self only HEENT: PERRL Neck: contusion over R neck. CV: RRR Pulm: chest wall with evolution of hematoma on the right - monitor. unlabored breathing on room air Abd: soft, NT    GU: urine clear and dark yellow, +spontaneous voids Extr: wwp, no edema  Lab Results:  Recent Labs    01/07/23 0022 01/08/23 0601  WBC 12.4* 8.0  HGB 8.5* 7.3*  HCT 25.4* 22.0*  PLT 158 140*    BMET Recent Labs    01/07/23 0022 01/08/23 0601  NA 139 142  K 4.0 3.9  CL 109 104  CO2 24 24  GLUCOSE 126* 119*  BUN 31* 24*  CREATININE 1.25* 1.12  CALCIUM 8.4* 8.4*    PT/INR No results for input(s): "LABPROT", "INR" in the last 72 hours.  CMP     Component Value Date/Time   NA 142 01/08/2023 0601   K 3.9 01/08/2023 0601   CL 104 01/08/2023 0601   CO2 24 01/08/2023 0601   GLUCOSE 119 (H) 01/08/2023 0601   BUN 24 (H) 01/08/2023 0601   CREATININE 1.12 01/08/2023 0601   CALCIUM 8.4 (L) 01/08/2023 0601   PROT 4.9 (L) 01/04/2023 1412   ALBUMIN 3.2 (L) 01/04/2023 1412   AST 69 (H) 01/04/2023 1412   ALT 40 01/04/2023 1412   ALKPHOS 52 01/04/2023 1412   BILITOT 2.4 (H) 01/04/2023 1412   GFRNONAA >60  01/08/2023 0601   Lipase  No results found for: "LIPASE"     Studies/Results: Overnight EEG with video  Result Date: 01/08/2023 Charlsie Quest, MD     01/08/2023  7:55 AM Patient Name: Omar Kennedy MRN: [id number] Epilepsy Attending: Charlsie Quest Referring Physician/Provider: Elmer Picker, NP Duration: 01/07/2023 1902 to 01/08/2023 0730 Patient history: 77 y.o. male with a pertinent medical history of dementia, insomnia who presented as a trauma after being found in an empty swimming pool. On arrival to the ED he was hypotensive with a scalp laceration and a right anterior chest wall contusion. Head CT showed a left subdural hematoma and minor traumatic SAH. EEG to evaluate for seizure. Level of alertness: lethargic AEDs during EEG study: LEV Technical aspects: This EEG study was done with scalp electrodes positioned according to the 10-20 International system of electrode placement. Electrical activity was reviewed with band pass filter of 1-70Hz , sensitivity of 7 uV/mm, display speed of 47mm/sec with a 60Hz  notched filter applied as appropriate. EEG data were recorded continuously and digitally stored.  Video monitoring was available and reviewed as appropriate. Description: EEG showed continuous generalized polymorphic sharply contoured 3 to 6 Hz theta-delta slowing. Hyperventilation and photic stimulation were not performed.   ABNORMALITY -  Continuous slow, generalized IMPRESSION: This study is suggestive of moderate diffuse encephalopathy, nonspecific etiology. No seizures or epileptiform discharges were seen throughout the recording. Priyanka Annabelle Harman    Anti-infectives: Anti-infectives (From admission, onward)    Start     Dose/Rate Route Frequency Ordered Stop   01/04/23 1530  ceFAZolin (ANCEF) IVPB 2g/100 mL premix        2 g 200 mL/hr over 30 Minutes Intravenous  Once 01/04/23 1526 01/04/23 1634       Assessment/Plan  Found down in an empty swimming pool    R scalp  laceration - closed in ED with 9 staples on 6/17 T11 fracture, multiple thoracic and lumbar spine transverse process fractures - per NS c/s, mobilize in brace SAH/SDH - NS c/s Dr. Yetta Barre, repeat CT stable 6/18, IV keppra x 7 days; Neuro consult to rule out other sources of AMS. EEG without epileptiform changes, MRI today per neuro  R chest wall hematoma  R clavicle fracture - ortho c/s, sling, NWB L scapula fracture - ortho c/s, WBAT R HTX, R Fib FX 5-6 - no PTX on CT. Multimodal pain control, IS, follow up CXR 6/18 without PTX, ?loculated effusion in periphery. On room air.  ABL anemia - s/p 1 u RBC 6/17, hgb 7.3 from 8.5, repeat CBC 1200. CXR without significant change in HTX. C-collar - significant motion on CT, but no fx, unable to participate in clinical exam, likely not going to sit still for MRI if unable to sit still enough for CT. Flex ex performed 6/18 and c collar was removed.  FEN - Reg, MIVF VTE - SCD's, hold LMWH today  ID - Tdap given in ED Admit - 4NP, PT/OT/SLP, anticipate SNF AM labs IS/flutter valve as able        LOS: 4 days   I reviewed nursing notes, last 24 h vitals and pain scores, last 48 h intake and output, last 24 h labs and trends, and last 24 h imaging results.  This care required moderate level of medical decision making.   Juliet Rude, [facility] 01/08/2023, 9:08 AM Please see Amion for pager number during day hours 7:00am-4:30pm

## 2023-01-08 NOTE — TOC Progression Note (Signed)
Transition of Care [facility]) - Progression Note    Patient Details  Name: Omar Kennedy MRN: [id number] Date of Birth: [birth year]  [facility] CM/SW Contact  Astrid Drafts Berna Spare, RN Phone Number: 01/08/2023, 3:54 PM  Clinical Narrative:    Bed offers emailed to patient's daughter, Shanda Bumps, as requested.  Will allow family to review ratings and reviews and ask weekend social worker to follow-up for bed choice.  Patient not medically stable for discharge at this time.  Expected Discharge Plan: Skilled Nursing Facility Barriers to Discharge: Continued Medical Work up  Expected Discharge Plan and Services   Discharge Planning Services: CM Consult   Living arrangements for the past 2 months: Single Family Home                                       Social Determinants of Health (SDOH) Interventions    Readmission Risk Interventions     No data to display         Quintella Baton, RN, BSN  Trauma/Neuro ICU Case Manager [phone]

## 2023-01-08 NOTE — Progress Notes (Signed)
Neurology Progress Note  Subjective: -Reports pain "in his brain" as well as in his right upper extremity  Exam: Vitals:   01/08/23 0557 01/08/23 0742  BP: (!) 136/55 (!) 144/67  Pulse: 75 75  Resp: 18 18  Temp:  (!) 97.3 F (36.3 C)  SpO2: 96% 94%   Gen: In bed, comfortable  Resp: non-labored breathing, no grossly audible wheezing Cardiac: Perfusing extremities well  Abd: soft, nt  Neuro: MS: Awake, alert, remains disoriented, asking to speak to his mom noting that he has not spoken to her in a week.  Intermittently follows some commands, does not answer orientation questions as he perseverates on wanting to speak to his mom CN: Mild right eye ptosis and right-sided facial droop, mild left gaze preference but able to look fully to the right, blinks to threat bilaterally, Motor: Using all 4 extremities spontaneously, clearly using the left side more readily than the right Sensory:Reactive to light touch throughout, less brisk on the right, difficult to distinguish sensory impairment vs. Motor weakness    Pertinent Data:  EEG negative for epileptogenic activity  Assessment: Examination remains consistent with delirium and expected brain dysfunction from his left subdural hemorrhage.  Fortunately EEG negative for subclinical seizures.  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  Left subdural hemorrhage with some aphasia and right-sided weakness Delirium likely secondary to pain, head injury, hospitalization, medication effects (Keppra, opiates)  Recommendations: -DC LTM EEG -Complete 7-day course of prophylactic Keppra -If excessively sedated at night, please reduce Seroquel to 12.5 mg nightly with an additional 12.5 as needed for agitation overnight -Discontinuation of tramadol and replacement with oxycodone, given tramadol does lower the seizure  threshold was discussed with primary team by phone yesterday and implemented -MRI brain has been ordered and I will follow-up this study, otherwise neurology will be available as needed going forward  Brooke Dare MD-PhD [facility] [phone]  Available 7 AM to 7 PM, outside these hours please contact Neurologist on call listed on AMION

## 2023-01-08 NOTE — Progress Notes (Signed)
Speech Language Pathology Treatment: Dysphagia;Cognitive-Linquistic  Patient Details Name: Omar Kennedy MRN: [id number] DOB: November 11, 1945 Today's Date: 01/08/2023 Time: 4098-1191 SLP Time Calculation (min) (ACUTE ONLY): 16 min  Assessment / Plan / Recommendation Clinical Impression  Omar Kennedy was restless this am, requiring max multimodal cues for gentle redirection.  Demonstrates persisting difficulty following commands with cues needed for shared focus then repetition to achieve success. Max cues needed for word-retrieval at confrontation-naming level for body parts/functional items in room.  Expression notable for semantic and phonemic paraphasias.    Intermittent coughing remains present with thin liquids. Recommend continuing nectar-thick liquids for now. Family friend present; recommended talking about shared memories from past rather than current situation, which appeared to create more anxiety for Omar Kennedy. SLP will continue to follow.   HPI HPI: Omar Kennedy. Omar Kennedy is a 77 y/o M with a PMH dementia who presented to the ED as a level 2 trauma after he was found down in an empty swimming pool behind his house. Sustained SAH/SDH, T11 fx, multiple thoracic and lumbar spine transverse process fractures, right clavicle and left scapula fracture, R HTX, R rib fx 5-6      SLP Plan  Continue with current plan of care      Recommendations for follow up therapy are one component of a multi-disciplinary discharge planning process, led by the attending physician.  Recommendations may be updated based on patient status, additional functional criteria and insurance authorization.    Recommendations  Diet recommendations: Regular;Nectar-thick liquid Liquids provided via: Cup;Straw Medication Administration: Crushed with puree Supervision: Staff to assist with self feeding;Full supervision/cueing for compensatory strategies Compensations: Minimize environmental distractions;Slow rate;Small  sips/bites                  Oral care BID   Frequent or constant Supervision/Assistance Dysphagia, unspecified (R13.10)     Continue with current plan of care    Laporsha Grealish L. Samson Frederic, MA CCC/SLP Clinical Specialist - Acute Care SLP Acute Rehabilitation Services Office number [phone]  Blenda Mounts Laurice  01/08/2023, 10:55 AM

## 2023-01-08 NOTE — Progress Notes (Signed)
LTM DC at[facility] notified no skin breakdown noted at all skin cites

## 2023-01-08 NOTE — Progress Notes (Signed)
Goals of care discussion with the patient's daughter who is a physician in [place] and the patient's wife. Detailed discussion regarding neurology interventions and recommendations. MRI/EEG results provided. Discussed challenges with PO feeds due to somnolence and concern for aspiration. Provided options of cortrak placement +/- PEG. Family agreeable to cortrak, not in agreement with PEG. I recommended a time-limited trial to monitor progression. They would like to give him some additional time for completion of keppra course to see if mentation improves. Will re-assess next week and place cortrak if no improvement by Monday. Counseled that I would also recommend family be specific about defining a timeline for improvement. Also discussed code status. Wife is very clear that he would not want chest compressions, defibrillation, vasopressors/anti-arrhythmics, or mechanical ventilation. They are in agreement with fluids, abx, and temporary feeding access/nutrition. Code status changed electronically.   Critical care time:  Diamantina Monks, MD General and Trauma Surgery [facility] Surgery

## 2023-01-08 NOTE — TOC Progression Note (Signed)
Transition of Care [facility]) - Progression Note    Patient Details  Name: Omar Kennedy MRN: [id number] Date of Birth: 27-Sep-1945  [facility]) CM/SW Contact  Astrid Drafts Berna Spare, RN Phone Number: 01/08/2023, 10:00am Clinical Narrative:    Spoke with patient's spouse, [name], regarding discharge planning: Wife states that patient has dementia, which has been worsening over the last few months.  She has recently had ankle surgery, and is unable to take care of patient at this time.  She is concerned that she may not be able to take care of him in the home anymore.  We discussed options for discharge and recommendations for SNF for rehab.  She is agreeable to this and would like to proceed with a bed search will initiate FL-2 and fax out for SNF bed.  Will provide bed offers when available to family.  Addendum: 1500pm Met with patient, wife and daughter in the room; we again reviewed discharge plan.  Currently awaiting bed offers for SNF.  Daughter would prefer bed offers be emailed to her at [email or web address].  Provided brochure and contact information for [facility], who may be able to assist wife and daughter with transition from SNF to assisted living/memory care.  They are appreciative of assistance.  Will follow-up with bed offers when available.  Expected Discharge Plan: Skilled Nursing Facility Barriers to Discharge: Continued Medical Work up  Expected Discharge Plan and Services   Discharge Planning Services: CM Consult   Living arrangements for the past 2 months: Single Family Home                                       Social Determinants of Health (SDOH) Interventions    Readmission Risk Interventions     No data to display         Quintella Baton, RN, BSN  Trauma/Neuro ICU Case Manager [phone]

## 2023-01-08 NOTE — Procedures (Addendum)
Patient Name: Omar Kennedy  MRN: [id number]  Epilepsy Attending: Charlsie Quest  Referring Physician/Provider: Elmer Picker, NP  Duration: 01/07/2023 1902 to 01/08/2023 0847  Patient history: 77 y.o. male with a pertinent medical history of dementia, insomnia who presented as a trauma after being found in an empty swimming pool. On arrival to the ED he was hypotensive with a scalp laceration and a right anterior chest wall contusion. Head CT showed a left subdural hematoma and minor traumatic SAH. EEG to evaluate for seizure.  Level of alertness: lethargic   AEDs during EEG study: LEV  Technical aspects: This EEG study was done with scalp electrodes positioned according to the 10-20 International system of electrode placement. Electrical activity was reviewed with band pass filter of 1-70Hz , sensitivity of 7 uV/mm, display speed of 73mm/sec with a 60Hz  notched filter applied as appropriate. EEG data were recorded continuously and digitally stored.  Video monitoring was available and reviewed as appropriate.  Description: EEG showed continuous generalized polymorphic sharply contoured 3 to 6 Hz theta-delta slowing. Hyperventilation and photic stimulation were not performed.     ABNORMALITY - Continuous slow, generalized  IMPRESSION: This study is suggestive of moderate diffuse encephalopathy, nonspecific etiology. No seizures or epileptiform discharges were seen throughout the recording.  Greysin Medlen Annabelle Harman

## 2023-01-09 NOTE — Progress Notes (Signed)
Physical Therapy Treatment Patient Details Name: Omar Kennedy MRN: [id number] DOB: 07-29-1945 Today's Date: 01/09/2023   History of Present Illness Pt is 77 yo male who presents on 01/04/23 after falling into empty pool. Sustained R scalp laceration, T11 fx, multiple T/L TP fxs, SAH/ SDH, R chest wall hematoma, R clavicle fx (NWB, sling), L scapula fx (WBAT), R rib fxs 5-6, R HTX. PMH: dementia    PT Comments    Pt calling out to staff in halls.  Upon entry he is clearly confused but has managed to position himself lower in bed.  He required multiple bouts of scooting and rolling to reposition and clean patient of urinary incontinence.  He continues to benefit from skilled rehab to maximize functional gains and he recovers from his injuries.  Rolling with PTA necessary to assist staff and maintain skin integrity as he had been combative with staff attempt but responded well to PTA.     Recommendations for follow up therapy are one component of a multi-disciplinary discharge planning process, led by the attending physician.  Recommendations may be updated based on patient status, additional functional criteria and insurance authorization.  Follow Up Recommendations  Can patient physically be transported by private vehicle: No    Assistance Recommended at Discharge Frequent or constant Supervision/Assistance  Patient can return home with the following A lot of help with walking and/or transfers;A lot of help with bathing/dressing/bathroom;Assistance with cooking/housework;Assistance with feeding;Direct supervision/assist for medications management;Direct supervision/assist for financial management;Assist for transportation;Help with stairs or ramp for entrance   Equipment Recommendations  Other (comment) (TBD)    Recommendations for Other Services       Precautions / Restrictions Precautions Precautions: Back;Fall Precaution Booklet Issued: No Precaution Comments: pt unable to  understand or retain precautions, will need cueing every time Required Braces or Orthoses: Sling;Spinal Brace Spinal Brace: Lumbar corset;Applied in sitting position Restrictions Weight Bearing Restrictions: Yes RUE Weight Bearing: Non weight bearing LUE Weight Bearing: Weight bear through elbow only     Mobility  Bed Mobility Overal bed mobility: Needs Assistance Bed Mobility: Rolling Rolling: Total assist Sidelying to sit: +2 for physical assistance       General bed mobility comments: Pt required rolling to R and L as well as boosting to [facility].  He followed commands for rolling to R and L side without agitation but noticeable pain. He lacks ability to sit upright post session of reposition so left in modified supine position.    Transfers                        Ambulation/Gait                   Stairs             Wheelchair Mobility    Modified Rankin (Stroke Patients Only)       Balance                                            Cognition Arousal/Alertness: Awake/alert Behavior During Therapy: Anxious (but redirectable.) Overall Cognitive Status: Impaired/Different from baseline                                 General Comments: Pt with nonsensical conversation but able to redirect to  reposition in bed and clean patient of urinary incontinence.        Exercises      General Comments        Pertinent Vitals/Pain Pain Assessment Pain Assessment: Faces Faces Pain Scale: Hurts whole lot Pain Location: generalized, R shoulder, LLE Pain Descriptors / Indicators: Discomfort, Grimacing, Moaning Pain Intervention(s): Monitored during session    Home Living                          Prior Function            PT Goals (current goals can now be found in the care plan section) Acute Rehab PT Goals Patient Stated Goal: none stated Potential to Achieve Goals: Fair Progress towards PT goals:  Progressing toward goals    Frequency    Min 3X/week      PT Plan Current plan remains appropriate    Co-evaluation              AM-PAC PT "6 Clicks" Mobility   Outcome Measure  Help needed turning from your back to your side while in a flat bed without using bedrails?: Total Help needed moving from lying on your back to sitting on the side of a flat bed without using bedrails?: Total Help needed moving to and from a bed to a chair (including a wheelchair)?: Total Help needed standing up from a chair using your arms (e.g., wheelchair or bedside chair)?: Total Help needed to walk in hospital room?: Total Help needed climbing 3-5 steps with a railing? : Total 6 Click Score: 6    End of Session   Activity Tolerance: Patient limited by pain Patient left: in bed;with call bell/phone within reach;with bed alarm set Nurse Communication: Mobility status PT Visit Diagnosis: Pain;History of falling (Z91.81);Difficulty in walking, not elsewhere classified (R26.2) Pain - Right/Left: Right Pain - part of body: Shoulder     Time: 1610-9604 PT Time Calculation (min) (ACUTE ONLY): 17 min  Charges:  $Therapeutic Activity: 8-22 mins                     Bonney Leitz , PTA Acute Rehabilitation Services Office [phone]    Florestine Avers 01/09/2023, 5:44 PM

## 2023-01-09 NOTE — TOC Progression Note (Signed)
Transition of Care [facility]) - Progression Note    Patient Details  Name: LAIRD RUNNION MRN: [id number] Date of Birth: [birth year]  Transition [facility] Care [facility]) CM/SW Contact  Carmina Miller, [place] Phone Number: 01/09/2023, 1:38 PM  Clinical Narrative:     CSW emailed bed offers to pt's daughter at her request, at this time only two offers are in, will continue to communicate additional offers as they come in.   Expected Discharge Plan: Skilled Nursing Facility Barriers to Discharge: Continued Medical Work up  Expected Discharge Plan and Services   Discharge Planning Services: CM Consult   Living arrangements for the past 2 months: Single Family Home                                       Social Determinants of Health (SDOH) Interventions    Readmission Risk Interventions     No data to display

## 2023-01-09 NOTE — Progress Notes (Signed)
Assessment & Plan: HD#6 - Found down in an empty swimming pool    R scalp laceration - closed in ED with 9 staples on 6/17 T11 fracture, multiple thoracic and lumbar spine transverse process fractures - per NS c/s, mobilize in brace SAH/SDH - NS c/s Dr. Yetta Barre, repeat CT stable 6/18, IV keppra x 7 days; Neuro consult to rule out other sources of AMS. EEG without epileptiform changes. MRI stable with bilateral subdural hematoma R chest wall hematoma  R clavicle fracture - ortho c/s, sling, NWB L scapula fracture - ortho c/s, WBAT R HTX, R Fib FX 5-6 - no PTX on CT. Multimodal pain control, IS, follow up CXR 6/18 without PTX, ?loculated effusion in periphery. On room air.  ABL anemia - s/p 1 u RBC 6/17, hgb 7.3 from 8.5, repeat CBC 1200. CXR without significant change in HTX. C-collar - significant motion on CT, but no fx, unable to participate in clinical exam, likely not going to sit still for MRI if unable to sit still enough for CT. Flex ex performed 6/18 and c collar was removed.   FEN - Reg, MIVF VTE - SCD's, hold LMWH today  ID - Tdap given in ED Dispo - 6N, PT/OT/SLP, anticipate SNF        [name], MD [facility] practice Office: [phone]        Chief Complaint: Fall into empty swimming pool, TBI  Subjective: Patient bed, comfortable; wants water to drink.  Objective: Vital signs in last 24 hours: Temp:  [98.1 F (36.7 C)-98.6 F (37 C)] 98.4 F (36.9 C) (06/22 0618) Pulse Rate:  [81-97] 97 (06/22 0618) Resp:  [20] 20 (06/21 1700) BP: (144-164)/(65-80) 164/76 (06/22 0618) SpO2:  [93 %-95 %] 95 % (06/22 0618) Last BM Date : 01/06/23  Intake/Output from previous day: 06/21 0701 - 06/22 0700 In: -  Out: 1450 [Urine:1450] Intake/Output this shift: No intake/output data recorded.  Physical Exam: HEENT - sclerae clear, mucous membranes moist Neck - soft Chest - clear bilaterally; ecchymosis left chest wall Abdomen - soft,  non-tender Ext - no edema, non-tender  Lab Results:  Recent Labs    01/08/23 0601 01/08/23 1236  WBC 8.0 8.6  HGB 7.3* 8.4*  HCT 22.0* 24.9*  PLT 140* 138*   BMET Recent Labs    01/07/23 0022 01/08/23 0601  NA 139 142  K 4.0 3.9  CL 109 104  CO2 24 24  GLUCOSE 126* 119*  BUN 31* 24*  CREATININE 1.25* 1.12  CALCIUM 8.4* 8.4*   PT/INR No results for input(s): "LABPROT", "INR" in the last 72 hours. Comprehensive Metabolic Panel:    Component Value Date/Time   NA 142 01/08/2023 0601   NA 139 01/07/2023 0022   K 3.9 01/08/2023 0601   K 4.0 01/07/2023 0022   CL 104 01/08/2023 0601   CL 109 01/07/2023 0022   CO2 24 01/08/2023 0601   CO2 24 01/07/2023 0022   BUN 24 (H) 01/08/2023 0601   BUN 31 (H) 01/07/2023 0022   CREATININE 1.12 01/08/2023 0601   CREATININE 1.25 (H) 01/07/2023 0022   GLUCOSE 119 (H) 01/08/2023 0601   GLUCOSE 126 (H) 01/07/2023 0022   CALCIUM 8.4 (L) 01/08/2023 0601   CALCIUM 8.4 (L) 01/07/2023 0022   AST 69 (H) 01/04/2023 1412   ALT 40 01/04/2023 1412   ALKPHOS 52 01/04/2023 1412   BILITOT 2.4 (H) 01/04/2023 1412   PROT 4.9 (L) 01/04/2023 1412  ALBUMIN 3.2 (L) 01/04/2023 1412    Studies/Results: MR BRAIN WO CONTRAST  Result Date: 01/08/2023 CLINICAL DATA:  Delirium. EXAM: MRI HEAD WITHOUT CONTRAST TECHNIQUE: Multiplanar, multiecho pulse sequences of the brain and surrounding structures were obtained without intravenous contrast. COMPARISON:  Head CT 01/05/2023.  Brain MRI 04/02/2022. FINDINGS: Brain: Unchanged small bilateral holohemispheric subdural hematomas, measuring up to 7 mm on the left and 6 mm on the right. Unchanged scattered areas of subarachnoid hemorrhage, greatest along the left temporal convexity. Trace layering hemorrhage in the occipital horns. No evidence of acute hydrocephalus. No acute infarct. No significant mass effect or midline shift. Vascular: Normal flow voids. Skull and upper cervical spine: Normal marrow signal.  Sinuses/Orbits: Unremarkable. Other: None. IMPRESSION: 1. Unchanged small bilateral holohemispheric subdural hematomas, measuring up to 7 mm on the left and 6 mm on the right. 2. Unchanged scattered areas of subarachnoid hemorrhage, greatest along the left temporal convexity. 3. Trace layering hemorrhage in the occipital horns. No evidence of acute hydrocephalus. Electronically Signed   By: Orvan Falconer M.D.   On: 01/08/2023 15:58   CT CHEST WO CONTRAST  Result Date: 01/08/2023 CLINICAL DATA:  Blunt chest trauma, hemothorax EXAM: CT CHEST WITHOUT CONTRAST TECHNIQUE: Multidetector CT imaging of the chest was performed following the standard protocol without IV contrast. RADIATION DOSE REDUCTION: This exam was performed according to the departmental dose-optimization program which includes automated exposure control, adjustment of the mA and/or kV according to patient size and/or use of iterative reconstruction technique. COMPARISON:  01/04/2023 FINDINGS: Cardiovascular: Unenhanced imaging of the heart is unremarkable without pericardial effusion. Normal caliber of the thoracic aorta. Stable atherosclerosis of the aorta and native coronary vasculature. Previous CABG. Evaluation of the vascular lumen is limited without IV contrast. Mediastinum/Nodes: No enlarged mediastinal or axillary lymph nodes. Thyroid gland, trachea, and esophagus demonstrate no significant findings. Lungs/Pleura: There is a trace anterior right pneumothorax, volume estimated far less than 5%. Small right hemothorax grossly unchanged since prior exam. There is progressive consolidation within the dependent right lung, compatible with either contusion or atelectasis. There is a trace left pleural effusion that has developed in the interim. Minimal dependent hypoventilatory changes are seen within the left lower lobe. No left-sided pneumothorax. The central airways are patent. Upper Abdomen: Limited imaging through the upper abdomen  demonstrates no acute findings. Musculoskeletal: Comminuted displaced intra-articular fracture of the right clavicular head again noted, with 1.5 cm inferior displacement of the lateral fracture fragment. Minimally displaced fracture through the superomedial aspect of the right scapula is now appreciated, with near anatomic alignment. The multiple right-sided rib fractures and right-sided thoracic transverse process fractures are again noted. Previous median sternotomy. Continued but decreased enlargement of the right chest wall and right paraspinal musculature consistent with trauma and likely intramuscular hematoma. Reconstructed images demonstrate no additional findings. IMPRESSION: 1. Interval development of trace anterior right pneumothorax, volume estimated far less than 5%. 2. Stable small right hemothorax. 3. New trace left pleural effusion. 4. Bilateral dependent lung consolidation, right greater than left, favor atelectasis. 5. Fractures of the right clavicular head, right scapula, multiple right-sided ribs, and right-sided thoracic transverse processes as above. 6. Persistent but slightly decreased enlargement of the right-sided chest wall musculature consistent with trauma and intramuscular hematoma. These results will be called to the ordering clinician or representative by the Radiologist Assistant, and communication documented in the PACS or [employer]. Electronically Signed   By: Sharlet Salina M.D.   On: 01/08/2023 14:20   DG CHEST PORT 1  VIEW  Result Date: 01/08/2023 CLINICAL DATA:  Right hemothorax EXAM: PORTABLE CHEST 1 VIEW COMPARISON:  01/05/2023 FINDINGS: Cardiac shadow is enlarged but stable. Tortuous thoracic aorta is noted. Postsurgical changes are seen. Lungs are hypoinflated but linear or. Previously seen pleural based density on the right has resolved. Considerable extrinsic artifact is noted. IMPRESSION: Resolution of previously seen pleural based density. No new focal  abnormality is noted. Electronically Signed   By: Alcide Clever M.D.   On: 01/08/2023 09:55   Overnight EEG with video  Result Date: 01/08/2023 Charlsie Quest, MD     01/08/2023  9:38 AM Patient Name: KLYDE BANKA MRN: [id number] Epilepsy Attending: Charlsie Quest Referring Physician/Provider: Elmer Picker, NP Duration: 01/07/2023 1902 to 01/08/2023 0847 Patient history: 77 y.o. male with a pertinent medical history of dementia, insomnia who presented as a trauma after being found in an empty swimming pool. On arrival to the ED he was hypotensive with a scalp laceration and a right anterior chest wall contusion. Head CT showed a left subdural hematoma and minor traumatic SAH. EEG to evaluate for seizure. Level of alertness: lethargic AEDs during EEG study: LEV Technical aspects: This EEG study was done with scalp electrodes positioned according to the 10-20 International system of electrode placement. Electrical activity was reviewed with band pass filter of 1-70Hz , sensitivity of 7 uV/mm, display speed of 27mm/sec with a 60Hz  notched filter applied as appropriate. EEG data were recorded continuously and digitally stored.  Video monitoring was available and reviewed as appropriate. Description: EEG showed continuous generalized polymorphic sharply contoured 3 to 6 Hz theta-delta slowing. Hyperventilation and photic stimulation were not performed.   ABNORMALITY - Continuous slow, generalized IMPRESSION: This study is suggestive of moderate diffuse encephalopathy, nonspecific etiology. No seizures or epileptiform discharges were seen throughout the recording. Priyanka Salley Slaughter Sharada Albornoz 01/09/2023  Patient ID: Cheri Guppy, male   DOB: 10/16/1945, 77 y.o.   MRN: [id number]

## 2023-01-10 MED ORDER — SODIUM CHLORIDE 0.9 % IV SOLN: INTRAVENOUS | Status: DC

## 2023-01-10 NOTE — Progress Notes (Signed)
Assessment & Plan: HD#7 - Found down in an empty swimming pool    R scalp laceration - closed in ED with 9 staples on 6/17; dry and intact T11 fracture, multiple thoracic and lumbar spine transverse process fractures - per NS c/s, mobilize in brace SAH/SDH - NS c/s Dr. Yetta Barre, repeat CT stable 6/18, IV keppra x 7 days; Neuro consult to rule out other sources of AMS. EEG without epileptiform changes. MRI stable with bilateral subdural hematoma R chest wall hematoma - stable R clavicle fracture - ortho c/s, sling, NWB L scapula fracture - ortho c/s, WBAT R HTX, R Fib FX 5-6 - no PTX on CT. Multimodal pain control, IS, follow up CXR 6/18 without PTX, ?loculated effusion in periphery. On room air.  ABL anemia - s/p 1 u RBC 6/17, hgb 7.3 from 8.5, repeat CBC 1200. CXR without significant change in HTX. C-collar - significant motion on CT, but no fx, unable to participate in clinical exam, likely not going to sit still for MRI if unable to sit still enough for CT. Flex ex performed 6/18 and c collar was removed.   FEN - Reg, MIVF VTE - SCD's, hold LMWH today  ID - Tdap given in ED Dispo - 6N, PT/OT/SLP, anticipate SNF        Omar Level, MD [facility] practice Office: [phone]        Chief Complaint: Fall  Subjective: Patient up in bed, pleasant, responsive.  No complaints.  Objective: Vital signs in last 24 hours: Temp:  [97.4 F (36.3 C)-98.2 F (36.8 C)] 97.4 F (36.3 C) (06/23 0700) Pulse Rate:  [88-90] 89 (06/23 0700) Resp:  [19] 19 (06/22 1624) BP: (143-165)/(56-77) 158/64 (06/23 0700) SpO2:  [96 %-100 %] 99 % (06/23 0515) Last BM Date : 01/06/23  Intake/Output from previous day: 06/22 0701 - 06/23 0700 In: 240 [P.O.:240] Out: 1050 [Urine:1050] Intake/Output this shift: No intake/output data recorded.  Physical Exam: HEENT - scalp lac dry and remains closed Chest - hematoma right medial clavicle stable Abdomen - soft,  non-tender  Lab Results:  Recent Labs    01/08/23 0601 01/08/23 1236  WBC 8.0 8.6  HGB 7.3* 8.4*  HCT 22.0* 24.9*  PLT 140* 138*   BMET Recent Labs    01/08/23 0601  NA 142  K 3.9  CL 104  CO2 24  GLUCOSE 119*  BUN 24*  CREATININE 1.12  CALCIUM 8.4*   PT/INR No results for input(s): "LABPROT", "INR" in the last 72 hours. Comprehensive Metabolic Panel:    Component Value Date/Time   NA 142 01/08/2023 0601   NA 139 01/07/2023 0022   K 3.9 01/08/2023 0601   K 4.0 01/07/2023 0022   CL 104 01/08/2023 0601   CL 109 01/07/2023 0022   CO2 24 01/08/2023 0601   CO2 24 01/07/2023 0022   BUN 24 (H) 01/08/2023 0601   BUN 31 (H) 01/07/2023 0022   CREATININE 1.12 01/08/2023 0601   CREATININE 1.25 (H) 01/07/2023 0022   GLUCOSE 119 (H) 01/08/2023 0601   GLUCOSE 126 (H) 01/07/2023 0022   CALCIUM 8.4 (L) 01/08/2023 0601   CALCIUM 8.4 (L) 01/07/2023 0022   AST 69 (H) 01/04/2023 1412   ALT 40 01/04/2023 1412   ALKPHOS 52 01/04/2023 1412   BILITOT 2.4 (H) 01/04/2023 1412   PROT 4.9 (L) 01/04/2023 1412   ALBUMIN 3.2 (L) 01/04/2023 1412    Studies/Results: MR BRAIN WO CONTRAST  Result Date: 01/08/2023  CLINICAL DATA:  Delirium. EXAM: MRI HEAD WITHOUT CONTRAST TECHNIQUE: Multiplanar, multiecho pulse sequences of the brain and surrounding structures were obtained without intravenous contrast. COMPARISON:  Head CT 01/05/2023.  Brain MRI 04/02/2022. FINDINGS: Brain: Unchanged small bilateral holohemispheric subdural hematomas, measuring up to 7 mm on the left and 6 mm on the right. Unchanged scattered areas of subarachnoid hemorrhage, greatest along the left temporal convexity. Trace layering hemorrhage in the occipital horns. No evidence of acute hydrocephalus. No acute infarct. No significant mass effect or midline shift. Vascular: Normal flow voids. Skull and upper cervical spine: Normal marrow signal. Sinuses/Orbits: Unremarkable. Other: None. IMPRESSION: 1. Unchanged small bilateral  holohemispheric subdural hematomas, measuring up to 7 mm on the left and 6 mm on the right. 2. Unchanged scattered areas of subarachnoid hemorrhage, greatest along the left temporal convexity. 3. Trace layering hemorrhage in the occipital horns. No evidence of acute hydrocephalus. Electronically Signed   By: Omar Kennedy M.D.   On: 01/08/2023 15:58   CT CHEST WO CONTRAST  Result Date: 01/08/2023 CLINICAL DATA:  Blunt chest trauma, hemothorax EXAM: CT CHEST WITHOUT CONTRAST TECHNIQUE: Multidetector CT imaging of the chest was performed following the standard protocol without IV contrast. RADIATION DOSE REDUCTION: This exam was performed according to the departmental dose-optimization program which includes automated exposure control, adjustment of the mA and/or kV according to patient size and/or use of iterative reconstruction technique. COMPARISON:  01/04/2023 FINDINGS: Cardiovascular: Unenhanced imaging of the heart is unremarkable without pericardial effusion. Normal caliber of the thoracic aorta. Stable atherosclerosis of the aorta and native coronary vasculature. Previous CABG. Evaluation of the vascular lumen is limited without IV contrast. Mediastinum/Nodes: No enlarged mediastinal or axillary lymph nodes. Thyroid gland, trachea, and esophagus demonstrate no significant findings. Lungs/Pleura: There is a trace anterior right pneumothorax, volume estimated far less than 5%. Small right hemothorax grossly unchanged since prior exam. There is progressive consolidation within the dependent right lung, compatible with either contusion or atelectasis. There is a trace left pleural effusion that has developed in the interim. Minimal dependent hypoventilatory changes are seen within the left lower lobe. No left-sided pneumothorax. The central airways are patent. Upper Abdomen: Limited imaging through the upper abdomen demonstrates no acute findings. Musculoskeletal: Comminuted displaced intra-articular  fracture of the right clavicular head again noted, with 1.5 cm inferior displacement of the lateral fracture fragment. Minimally displaced fracture through the superomedial aspect of the right scapula is now appreciated, with near anatomic alignment. The multiple right-sided rib fractures and right-sided thoracic transverse process fractures are again noted. Previous median sternotomy. Continued but decreased enlargement of the right chest wall and right paraspinal musculature consistent with trauma and likely intramuscular hematoma. Reconstructed images demonstrate no additional findings. IMPRESSION: 1. Interval development of trace anterior right pneumothorax, volume estimated far less than 5%. 2. Stable small right hemothorax. 3. New trace left pleural effusion. 4. Bilateral dependent lung consolidation, right greater than left, favor atelectasis. 5. Fractures of the right clavicular head, right scapula, multiple right-sided ribs, and right-sided thoracic transverse processes as above. 6. Persistent but slightly decreased enlargement of the right-sided chest wall musculature consistent with trauma and intramuscular hematoma. These results will be called to the ordering clinician or representative by the Radiologist Assistant, and communication documented in the PACS or [employer]. Electronically Signed   By: Sharlet Salina M.D.   On: 01/08/2023 14:20      Omar Kennedy 01/10/2023  Patient ID: Omar Kennedy, male   DOB: 25-May-1946, 77 y.o.  MRN: [id number]

## 2023-01-10 NOTE — Progress Notes (Signed)
Patient's RN, Alexia Freestone, came to me stating that the patient had pulled his IV, pulled off his external  urinary cath, and was being combative. I paged [facility] and Dr. Feliciana Rossetti returned my call. He gave me a verbal order for non violent soft wrist restraints which I placed. Then Patty, Melissa (NT), and I placed the wrist restraints on patient. New external urinary cath placed, new IV placed, and patient offered fluids. Patient now resting comfortably.

## 2023-01-10 NOTE — TOC Progression Note (Signed)
Transition of Care [facility]) - Progression Note    Patient Details  Name: Omar Kennedy MRN: [id number] Date of Birth: Mar 21, 1946  [facility]) CM/SW Contact  Carmina Miller, [place] Phone Number: 01/10/2023, 12:24 PM  Clinical Narrative:     CSW spoke with pt's daughter Shanda Bumps, confirmed she received the emailed bed offers sent yesterday. Shanda Bumps states they were hoping for [facility], sent a text to [facility] admissions rep Whitney to review referral but unsure if this will happen today, RNCM can follow up tomorrow with Whitney. Shanda Bumps has concerns about pt not eating and next steps should he continue to refuse, states she will follow up with MD tomorrow.    Expected Discharge Plan: Skilled Nursing Facility Barriers to Discharge: Continued Medical Work up  Expected Discharge Plan and Services   Discharge Planning Services: CM Consult   Living arrangements for the past 2 months: Single Family Home                                       Social Determinants of Health (SDOH) Interventions    Readmission Risk Interventions     No data to display

## 2023-01-10 NOTE — Progress Notes (Signed)
PA Trixie Deis paged in regards to patient's lack of oral intake. New orders placed for maintenance IV fluids. Will continue to encourage food and water.

## 2023-01-10 NOTE — Plan of Care (Signed)

## 2023-01-11 ENCOUNTER — Inpatient Hospital Stay (HOSPITAL_COMMUNITY): Payer: Medicare PPO

## 2023-01-11 LAB — BASIC METABOLIC PANEL
Anion gap: 11 (ref 5–15)
BUN: 18 mg/dL (ref 8–23)
CO2: 24 mmol/L (ref 22–32)
Calcium: 8.4 mg/dL — ABNORMAL LOW (ref 8.9–10.3)
Chloride: 106 mmol/L (ref 98–111)
Creatinine, Ser: 0.82 mg/dL (ref 0.61–1.24)
GFR, Estimated: >60 mL/min (ref >60)
Glucose, Bld: 105 mg/dL — ABNORMAL HIGH (ref 70–99)
Potassium: 3.7 mmol/L (ref 3.5–5.1)
Sodium: 141 mmol/L (ref 135–145)

## 2023-01-11 LAB — PHOSPHORUS: Phosphorus: 3 mg/dL (ref 2.5–4.6)

## 2023-01-11 LAB — GLUCOSE, CAPILLARY
Glucose-Capillary: 127 mg/dL — ABNORMAL HIGH (ref 70–99)
Glucose-Capillary: 131 mg/dL — ABNORMAL HIGH (ref 70–99)
Glucose-Capillary: 139 mg/dL — ABNORMAL HIGH (ref 70–99)

## 2023-01-11 MED ORDER — PROSOURCE PLUS PO LIQD
30.0000 mL | Freq: Two times a day (BID) | ORAL | Status: DC
  Administered 2023-01-11 – 2023-01-14 (×6): 30 mL via ORAL
  Filled 2023-01-11 (×7): qty 30

## 2023-01-11 MED ORDER — THIAMINE MONONITRATE 100 MG PO TABS
100.0000 mg | ORAL_TABLET | Freq: Every day | ORAL | Status: AC
  Administered 2023-01-11 – 2023-01-14 (×4): 100 mg
  Filled 2023-01-11 (×4): qty 1

## 2023-01-11 MED ORDER — FREE WATER
150.0000 mL | Status: DC
  Administered 2023-01-11 – 2023-01-14 (×18): 150 mL

## 2023-01-11 MED ORDER — THIAMINE MONONITRATE 100 MG PO TABS: 100.0000 mg | ORAL_TABLET | Freq: Every day | ORAL | Status: DC

## 2023-01-11 MED ORDER — OSMOLITE 1.5 CAL PO LIQD
1000.0000 mL | ORAL | Status: DC
  Administered 2023-01-11 – 2023-01-13 (×3): 1000 mL

## 2023-01-11 MED ORDER — OSMOLITE 1.2 CAL PO LIQD
1000.0000 mL | ORAL | Status: DC
  Administered 2023-01-11: 1000 mL
  Filled 2023-01-11: qty 1000

## 2023-01-11 MED ORDER — OSMOLITE 1.5 CAL PO LIQD: 1000.0000 mL | ORAL | Status: DC

## 2023-01-11 MED ORDER — HALOPERIDOL LACTATE 5 MG/ML IJ SOLN
2.0000 mg | Freq: Four times a day (QID) | INTRAMUSCULAR | Status: DC | PRN
  Administered 2023-01-15 – 2023-01-22 (×6): 2 mg via INTRAVENOUS
  Filled 2023-01-11 (×6): qty 1

## 2023-01-11 NOTE — Progress Notes (Signed)
Per wife [name], please call her first as primary contact and daughter Shanda Bumps as secondary.

## 2023-01-11 NOTE — Progress Notes (Signed)
Physical Therapy Treatment Patient Details Name: Omar Kennedy MRN: [id number] DOB: 17-May-1946 Today's Date: 01/11/2023   History of Present Illness Pt is 77 yo male who presents on 01/04/23 after falling into empty pool. Sustained R scalp laceration, T11 fx, multiple T/L TP fxs, SAH/ SDH, R chest wall hematoma, R clavicle fx (NWB, sling), L scapula fx (WBAT), R rib fxs 5-6, R HTX. PMH: dementia    PT Comments    Pt supine in bed on arrival this session.  He was in soft wrist restraints but removed secondary to UE fxs.  Press photographer and RN aware.  He required total assistance to donn sling to RUE and LSO to back.  Pt able to move into long sitting with bed tilted to donn brace.  He then performed sitting edge of bed and partial sit to stands x2.  Continue to recommend rehab in a post acute settign.     Recommendations for follow up therapy are one component of a multi-disciplinary discharge planning process, led by the attending physician.  Recommendations may be updated based on patient status, additional functional criteria and insurance authorization.  Follow Up Recommendations  Can patient physically be transported by private vehicle: No    Assistance Recommended at Discharge Frequent or constant Supervision/Assistance  Patient can return home with the following A lot of help with walking and/or transfers;A lot of help with bathing/dressing/bathroom;Assistance with cooking/housework;Assistance with feeding;Direct supervision/assist for medications management;Direct supervision/assist for financial management;Assist for transportation;Help with stairs or ramp for entrance   Equipment Recommendations  Other (comment) (TBD)    Recommendations for Other Services       Precautions / Restrictions Precautions Precautions: Back;Fall Precaution Booklet Issued: No Precaution Comments: pt unable to understand or retain precautions, will need cueing every time Required Braces or Orthoses:  Sling;Spinal Brace Spinal Brace: Lumbar corset;Applied in sitting position Restrictions Weight Bearing Restrictions: Yes RUE Weight Bearing: Non weight bearing LUE Weight Bearing: Weight bear through elbow only     Mobility  Bed Mobility Overal bed mobility: Needs Assistance Bed Mobility: Supine to Sit, Rolling, Sit to Sidelying Rolling: Mod assist   Supine to sit: HOB elevated, Max assist   Sit to sidelying: Max assist, Total assist General bed mobility comments: Pt required supine to sit with bed in trended down position to improve ease and work with gravity.  Able to apply lumbar corsett in bed in long sitting.  To move to edge of bed he continues to use LUE and required cues to only weight bear through L elbow.  Sling donned on RUE.  In sitting he reports pain in back and requested to return to bed.  He was educated to attempt transfers. After transfers he returned to supine with max to total assistance due to pain.    Transfers Overall transfer level: Needs assistance Equipment used: None Transfers: Sit to/from Stand Sit to Stand: Mod assist           General transfer comment: Pt performed partial sit to stand x 2 but able to clear bottom during both trial just lacks ability to stand upright.  Trials are very short lived and he quickly returned back to bed.    Ambulation/Gait                   Stairs             Wheelchair Mobility    Modified Rankin (Stroke Patients Only)       Balance  Sitting balance-Leahy Scale: Poor       Standing balance-Leahy Scale: Poor                              Cognition Arousal/Alertness: Awake/alert Behavior During Therapy: Anxious Overall Cognitive Status: Impaired/Different from baseline                                 General Comments: Pt following commands this session and easier to redirect.        Exercises General Exercises - Lower Extremity Ankle Circles/Pumps:  AROM, Both, 10 reps Heel Slides: AROM, Both, 10 reps, Supine    General Comments        Pertinent Vitals/Pain Pain Assessment Pain Assessment: Faces Faces Pain Scale: Hurts whole lot Pain Location: generalized appears back pain in the worst. Pain Descriptors / Indicators: Discomfort, Grimacing, Moaning Pain Intervention(s): Monitored during session, Repositioned    Home Living                          Prior Function            PT Goals (current goals can now be found in the care plan section) Acute Rehab PT Goals Patient Stated Goal: none stated Potential to Achieve Goals: Fair Progress towards PT goals: Progressing toward goals    Frequency    Min 3X/week      PT Plan Current plan remains appropriate    Co-evaluation              AM-PAC PT "6 Clicks" Mobility   Outcome Measure  Help needed turning from your back to your side while in a flat bed without using bedrails?: Total Help needed moving from lying on your back to sitting on the side of a flat bed without using bedrails?: Total Help needed moving to and from a bed to a chair (including a wheelchair)?: A Lot Help needed standing up from a chair using your arms (e.g., wheelchair or bedside chair)?: A Lot Help needed to walk in hospital room?: Total Help needed climbing 3-5 steps with a railing? : Total 6 Click Score: 8    End of Session   Activity Tolerance: Patient limited by pain Patient left: in bed;with call bell/phone within reach;with bed alarm set Nurse Communication: Mobility status PT Visit Diagnosis: Pain;History of falling (Z91.81);Difficulty in walking, not elsewhere classified (R26.2) Pain - Right/Left: Right Pain - part of body: Shoulder     Time: 9833-8250 PT Time Calculation (min) (ACUTE ONLY): 23 min  Charges:  $Therapeutic Exercise: 8-22 mins $Therapeutic Activity: 8-22 mins                     Bonney Leitz , PTA Acute Rehabilitation Services Office  [phone]    Chester Romero Artis Delay 01/11/2023, 12:52 PM

## 2023-01-11 NOTE — Procedures (Signed)
Cortrak  Person Inserting Tube:  Aiyah Scarpelli L, RD Tube Type:  Cortrak - 43 inches Tube Size:  10 Tube Location:  Left nare Secured by: Bridle Technique Used to Measure Tube Placement:  Marking at nare/corner of mouth Cortrak Secured At:  65 cm   Cortrak Tube Team Note:  Consult received to place a Cortrak feeding tube.   X-ray is required, abdominal x-ray has been ordered by the Cortrak team. Please confirm tube placement before using the Cortrak tube.   If the tube becomes dislodged please keep the tube and contact the Cortrak team at [email or web address] for replacement.  If after hours and replacement cannot be delayed, place a NG tube and confirm placement with an abdominal x-ray.    Omar Kennedy RD, LDN Clinical Dietitian See AMiON for contact information.    

## 2023-01-11 NOTE — TOC Progression Note (Signed)
Transition of Care [facility]) - Progression Note    Patient Details  Name: IZRAEL PEAK MRN: [id number] Date of Birth: [birth year]  Transition of Care [facility]) CM/SW Contact  Astrid Drafts Berna Spare, RN Phone Number: 01/11/2023, 3:00pm  Clinical Narrative:    PA and TOC Case Manager met with patient, wife and sister in law, per request.  Cortrak placed and in use; family would like 48 hours with cortrak and are hopeful for improved appetite.  Bed offers reviewed with wife and sister in law; they prefer [facility], but they have no bed availability. They are aware that SNF will not accept pt with Cortrak.  Will follow/provide continued assistance with disposition.     Expected Discharge Plan: Skilled Nursing Facility Barriers to Discharge: Continued Medical Work up  Expected Discharge Plan and Services   Discharge Planning Services: CM Consult   Living arrangements for the past 2 months: Single Family Home                                       Social Determinants of Health (SDOH) Interventions    Readmission Risk Interventions     No data to display         Quintella Baton, RN, BSN  Trauma/Neuro ICU Case Manager [phone]

## 2023-01-11 NOTE — Progress Notes (Signed)
Initial Nutrition Assessment  DOCUMENTATION CODES:   Severe malnutrition in context of chronic illness  INTERVENTION:   Tube Feeding via Cortrak:  Osmolite 1.5 at 55 ml/hr Begin TF at rate of 20 ml/hr, titrate by 10 mL q 8 hours until goal rate of 55 ml/hr Pro-Source TF20 60 mL BID TF regimen at goal provides 123 g of protein, 2140 kcals and 1003 mL of free water  Add free water flush of 150 mL q 4 hours; total free water with TF at goal rate:1903 mL  If no BM post initiation of TF, recommend increasing bowel regimen given no BM in 5 days  Obtain new measured weight  Monitor magnesium, potassium, and phosphorus BID for at least 3 days, MD to replete as needed, as pt is at risk for refeeding syndrome given minimal oral intake since admitted (x 7 days) with malnutrition present. Phosphorus added to AM lab draw and current value 3.0 (wdl)  Magic cup TID with meals, each supplement provides 290 kcal and 9 grams of protein  Feeding assistance with meal tray set up and encouragement; family reports at home, when offered food and it is prepared for him and placed in from of him, he eats very well.   Add thiamine 100 mg daily for at least 5 days  NUTRITION DIAGNOSIS:   Severe Malnutrition related to chronic illness as evidenced by severe fat depletion, severe muscle depletion.  GOAL:   Patient will meet greater than or equal to 90% of their needs  MONITOR:   TF tolerance, PO intake, Supplement acceptance, Labs, Weight trends  REASON FOR ASSESSMENT:   Consult Enteral/tube feeding initiation and management  ASSESSMENT:   77 yo male admitted after being found down in empty swimming pool with multiple injuries including T11 fracture with multiple thoracic and lumbar spine transverse process fractures, SAH/SDH, R clavicle fx, L scapular fx, R fib Fx, R chest wall hematoma. PMH includes dementia  Pt is confused, oriented to self only, agitated at times. Pt slept through most of  assessment; pt's wife and pt's sister-in-law present and provided nutrition history. Pt is arousable but falls back asleep. Per report, pt had a rough night  Cortrak placed today with plans to initiate EN. Noted per Surgery, pt would not want PEG tube. Wife confirms this is pt's wishes.  Currently on Regular diet with Nectar Thick Liquids. Eating mostly 0% of meals, although noted 25% of dinner last night which was 100% of 1 yogurt. Pt with untouched meal tray at bedside.  Family reports he was eating at home; pt would not seek out food or meals but when offered food and when food placed in front of him, he would eat well with good appetite. Wife indicates pt was not good at drinking fluids however and she was always worried that he was dehydrated. Pt had Ensure at home but did not drink often.   Family indicates that pt has experienced weight loss, gradual over long time frame.  No current wt; admit wt 6/17 77.1 kg. No other weight encounters  Last BMP from 6/21; noted labs pending for this AM. Plan to add on phosphorus with plans to supplement if low prior to initiation of TF  +constipation; no BM in 5 days  At baseline, family indicates pt is very active, always doing something around house, works in yard for several hours a day  Labs: Creatinine wdl, BUN 24, no CBGs, serum glucose 119 Meds: calcium carbonate, cholecalciferol, colace, miralax prn, MVI, B12  NUTRITION - FOCUSED PHYSICAL EXAM:  Flowsheet Row Most Recent Value  Orbital Region Severe depletion  Upper Arm Region Severe depletion  Thoracic and Lumbar Region Unable to assess  Buccal Region Severe depletion  Temple Region Severe depletion  Clavicle Bone Region Severe depletion  Clavicle and Acromion Bone Region Severe depletion  Scapular Bone Region Severe depletion  Dorsal Hand Severe depletion  Patellar Region Unable to assess  Anterior Thigh Region Unable to assess  Posterior Calf Region Severe depletion        Diet Order:   Diet Order             Diet regular Room service appropriate? No; Fluid consistency: Nectar Thick  Diet effective now                   EDUCATION NEEDS:   Education needs have been addressed (pt's wife and sister-in-law at bedside)  Skin:  Skin Assessment: Skin Integrity Issues: Skin Integrity Issues:: Other (Comment) Other: facial laceration  Last BM:  6/19 type 6 large  Height:   Ht Readings from Last 1 Encounters:  01/04/23 5\' 10"  (1.778 m)    Weight:   Wt Readings from Last 1 Encounters:  01/11/23 69.4 kg     BMI:  Body mass index is 21.95 kg/m.  Estimated Nutritional Needs:   Kcal:  2000-2200 kcals  Protein:  115-130 g  Fluid:  >/= 2L   Romelle Starcher MS, RDN, LDN, CNSC Registered Dietitian 3 Clinical Nutrition RD Pager and On-Call Pager Number Located in [place]

## 2023-01-11 NOTE — Progress Notes (Signed)
Pt has been very agitated this shift.  Dropping his legs off the side of bed, pulled IV out and external cath off and tele monitor, totally undressed himself. He was kicking and swinging at staff that was trying to help him.  Hollering loudly for everyone in the room to get out and not come back.  Charge nurse Carollee Herter RN) notified Dr. Sheliah Hatch of the above events. New order for restraints. Applied on pt at 2200.

## 2023-01-11 NOTE — Progress Notes (Signed)
Attempted to get patient to eat supper. Took 3 bites of magic cup and began to adamantly refuse.

## 2023-01-11 NOTE — Plan of Care (Signed)
  Problem: Education: Goal: Knowledge of General Education information will improve Description: Including pain rating scale, medication(s)/side effects and non-pharmacologic comfort measures Outcome: Not Progressing   Problem: Health Behavior/Discharge Planning: Goal: Ability to manage health-related needs will improve Outcome: Not Progressing   Problem: Coping: Goal: Level of anxiety will decrease Outcome: Not Progressing   

## 2023-01-11 NOTE — Plan of Care (Signed)
  Problem: Clinical Measurements: Goal: Ability to maintain clinical measurements within normal limits will improve Outcome: Progressing Goal: Will remain free from infection Outcome: Progressing Goal: Diagnostic test results will improve Outcome: Progressing Goal: Respiratory complications will improve Outcome: Progressing Goal: Cardiovascular complication will be avoided Outcome: Progressing   Problem: Nutrition: Goal: Adequate nutrition will be maintained Outcome: Progressing   Problem: Elimination: Goal: Will not experience complications related to bowel motility Outcome: Progressing Goal: Will not experience complications related to urinary retention Outcome: Progressing   Problem: Pain Managment: Goal: General experience of comfort will improve Outcome: Progressing   Problem: Safety: Goal: Ability to remain free from injury will improve Outcome: Progressing   Problem: Skin Integrity: Goal: Risk for impaired skin integrity will decrease Outcome: Progressing   Problem: Safety: Goal: Non-violent Restraint(s) Outcome: Progressing   

## 2023-01-11 NOTE — Progress Notes (Signed)
Progress Note     Subjective: Pt oriented to self only. Appears comfortable.   Objective: Vital signs in last 24 hours: Temp:  [98.1 F (36.7 C)-98.9 F (37.2 C)] 98.4 F (36.9 C) (06/24 0812) Pulse Rate:  [70-86] 70 (06/24 0812) Resp:  [16-18] 16 (06/24 0812) BP: (136-160)/(58-78) 156/66 (06/24 0812) SpO2:  [97 %-99 %] 99 % (06/24 0812) Last BM Date : 01/06/23  Intake/Output from previous day: 06/23 0701 - 06/24 0700 In: 514.7 [P.O.:410; I.V.:104.7] Out: 1150 [Urine:1150] Intake/Output this shift: No intake/output data recorded.  PE: Gen: comfortable, no distress Head: R scalp lac c/d/I, no cellulitis or purulence  Neuro: oriented to self only HEENT: PERRL Neck: contusion over R neck. CV: RRR Pulm: right chest wall hematoma  Abd: soft, NT    Extr: wwp, no edema   Lab Results:  Recent Labs    01/08/23 1236  WBC 8.6  HGB 8.4*  HCT 24.9*  PLT 138*   BMET No results for input(s): "NA", "K", "CL", "CO2", "GLUCOSE", "BUN", "CREATININE", "CALCIUM" in the last 72 hours. PT/INR No results for input(s): "LABPROT", "INR" in the last 72 hours. CMP     Component Value Date/Time   NA 142 01/08/2023 0601   K 3.9 01/08/2023 0601   CL 104 01/08/2023 0601   CO2 24 01/08/2023 0601   GLUCOSE 119 (H) 01/08/2023 0601   BUN 24 (H) 01/08/2023 0601   CREATININE 1.12 01/08/2023 0601   CALCIUM 8.4 (L) 01/08/2023 0601   PROT 4.9 (L) 01/04/2023 1412   ALBUMIN 3.2 (L) 01/04/2023 1412   AST 69 (H) 01/04/2023 1412   ALT 40 01/04/2023 1412   ALKPHOS 52 01/04/2023 1412   BILITOT 2.4 (H) 01/04/2023 1412   GFRNONAA >60 01/08/2023 0601   Lipase  No results found for: "LIPASE"     Studies/Results: No results found.  Anti-infectives: Anti-infectives (From admission, onward)    Start     Dose/Rate Route Frequency Ordered Stop   01/04/23 1530  ceFAZolin (ANCEF) IVPB 2g/100 mL premix        2 g 200 mL/hr over 30 Minutes Intravenous  Once 01/04/23 1526 01/04/23 1634         Assessment/Plan  Found down in an empty swimming pool    R scalp laceration - closed in ED with 9 staples on 6/17; remove staples 6/27 T11 fracture, multiple thoracic and lumbar spine transverse process fractures - per NS c/s, mobilize in brace SAH/SDH - NS c/s Dr. Yetta Barre, repeat CT stable 6/18, IV keppra x 7 days; Neuro consult to rule out other sources of AMS. EEG without epileptiform changes. MRI stable with bilateral subdural hematoma. Neuro signed off.  R chest wall hematoma - stable R clavicle fracture - ortho c/s, sling, NWB L scapula fracture - ortho c/s, WBAT R HTX, R Fib FX 5-6 - no PTX on CT. Multimodal pain control, IS, follow up CXR 6/18 without PTX, Follow up CT without increase in HTX and small (<5%) PTX ABL anemia - s/p 1 u RBC 6/17, hgb 8.4, stable  C-collar - significant motion on CT, but no fx, unable to participate in clinical exam, likely not going to sit still for MRI if unable to sit still enough for CT. Flex ex performed 6/18 and c collar was removed.   FEN - Reg as tolerated, Cortrak and TF today - will need to clarify with family how long we plan on doing that since PEG not desired  VTE - SCD's, LMWH ID -  Tdap given in ED Dispo - 6N, PT/OT/SLP, anticipate SNF   LOS: 7 days   I reviewed last 24 h vitals and pain scores, last 48 h intake and output, and last 24 h labs and trends.    Juliet Rude, [facility] 01/11/2023, 8:58 AM Please see Amion for pager number during day hours 7:00am-4:30pm

## 2023-01-12 LAB — BASIC METABOLIC PANEL
Anion gap: 7 (ref 5–15)
BUN: 25 mg/dL — ABNORMAL HIGH (ref 8–23)
CO2: 25 mmol/L (ref 22–32)
Calcium: 8.1 mg/dL — ABNORMAL LOW (ref 8.9–10.3)
Chloride: 106 mmol/L (ref 98–111)
Creatinine, Ser: 0.88 mg/dL (ref 0.61–1.24)
GFR, Estimated: >60 mL/min (ref >60)
Glucose, Bld: 130 mg/dL — ABNORMAL HIGH (ref 70–99)
Potassium: 3.6 mmol/L (ref 3.5–5.1)
Sodium: 138 mmol/L (ref 135–145)

## 2023-01-12 LAB — GLUCOSE, CAPILLARY
Glucose-Capillary: 141 mg/dL — ABNORMAL HIGH (ref 70–99)
Glucose-Capillary: 144 mg/dL — ABNORMAL HIGH (ref 70–99)
Glucose-Capillary: 156 mg/dL — ABNORMAL HIGH (ref 70–99)
Glucose-Capillary: 115 mg/dL — ABNORMAL HIGH (ref 70–99)

## 2023-01-12 LAB — PHOSPHORUS
Phosphorus: 3.2 mg/dL (ref 2.5–4.6)
Phosphorus: 3 mg/dL (ref 2.5–4.6)

## 2023-01-12 LAB — MAGNESIUM
Magnesium: 1.9 mg/dL (ref 1.7–2.4)
Magnesium: 1.9 mg/dL (ref 1.7–2.4)

## 2023-01-12 MED ORDER — POLYETHYLENE GLYCOL 3350 17 G PO PACK
17.0000 g | PACK | Freq: Two times a day (BID) | ORAL | Status: DC
  Administered 2023-01-12 – 2023-01-21 (×14): 17 g
  Filled 2023-01-12 (×16): qty 1

## 2023-01-12 NOTE — Progress Notes (Signed)
Progress Note     Subjective: No acute changes. Oriented to self only. Having some expressive aphasia. Denies wanting any of his breakfast but did drink water while I was in the room - cup almost empty.   Objective: Vital signs in last 24 hours: Temp:  [97.5 F (36.4 C)-98.5 F (36.9 C)] 98.5 F (36.9 C) (06/25 0516) Pulse Rate:  [64-72] 64 (06/25 0516) Resp:  [14-18] 14 (06/25 0516) BP: (135-156)/(54-66) 140/54 (06/25 0516) SpO2:  [95 %-99 %] 97 % (06/25 0516) Weight:  [69.4 kg-71.5 kg] 71.5 kg (06/25 0500) Last BM Date : 01/06/23  Intake/Output from previous day: 06/24 0701 - 06/25 0700 In: 614.9 [NG/GT:614.9] Out: 450 [Urine:450] Intake/Output this shift: No intake/output data recorded.  PE: Gen: comfortable, no distress Head: R scalp lac c/d/I, no cellulitis or purulence  Neuro: oriented to self only HEENT: PERRL Neck: contusion over R neck. CV: RRR Pulm: right chest wall hematoma  Abd: soft, NT    Extr: wwp, no edema   Lab Results:  No results for input(s): "WBC", "HGB", "HCT", "PLT" in the last 72 hours.  BMET Recent Labs    01/11/23 0940 01/12/23 0425  NA 141 138  K 3.7 3.6  CL 106 106  CO2 24 25  GLUCOSE 105* 130*  BUN 18 25*  CREATININE 0.82 0.88  CALCIUM 8.4* 8.1*   PT/INR No results for input(s): "LABPROT", "INR" in the last 72 hours. CMP     Component Value Date/Time   NA 138 01/12/2023 0425   K 3.6 01/12/2023 0425   CL 106 01/12/2023 0425   CO2 25 01/12/2023 0425   GLUCOSE 130 (H) 01/12/2023 0425   BUN 25 (H) 01/12/2023 0425   CREATININE 0.88 01/12/2023 0425   CALCIUM 8.1 (L) 01/12/2023 0425   PROT 4.9 (L) 01/04/2023 1412   ALBUMIN 3.2 (L) 01/04/2023 1412   AST 69 (H) 01/04/2023 1412   ALT 40 01/04/2023 1412   ALKPHOS 52 01/04/2023 1412   BILITOT 2.4 (H) 01/04/2023 1412   GFRNONAA >60 01/12/2023 0425   Lipase  No results found for: "LIPASE"     Studies/Results: DG Abd Portable 1V  Result Date: 01/11/2023 CLINICAL  DATA:  Check gastric catheter placement EXAM: PORTABLE ABDOMEN - 1 VIEW COMPARISON:  None Available. FINDINGS: Weighted feeding catheter is noted in the distal stomach. Scattered large and small bowel gas is noted. IMPRESSION: Weighted feeding catheter in the distal stomach. Electronically Signed   By: Alcide Clever M.D.   On: 01/11/2023 10:40    Anti-infectives: Anti-infectives (From admission, onward)    Start     Dose/Rate Route Frequency Ordered Stop   01/04/23 1530  ceFAZolin (ANCEF) IVPB 2g/100 mL premix        2 g 200 mL/hr over 30 Minutes Intravenous  Once 01/04/23 1526 01/04/23 1634        Assessment/Plan  Found down in an empty swimming pool    R scalp laceration - closed in ED with 9 staples on 6/17; remove staples 6/27 T11 fracture, multiple thoracic and lumbar spine transverse process fractures - per NS c/s, mobilize in brace SAH/SDH - NS c/s Dr. Yetta Barre, repeat CT stable 6/18, IV keppra x 7 days; Neuro consult to rule out other sources of AMS. EEG without epileptiform changes. MRI stable with bilateral subdural hematoma. Neuro signed off.  R chest wall hematoma - stable R clavicle fracture - ortho c/s, sling, NWB L scapula fracture - ortho c/s, WBAT R HTX, R Fib  FX 5-6, small PTX- Multimodal pain control, IS, follow up CXR 6/18 without PTX, Follow up CT without increase in HTX and small (<5%) PTX ABL anemia - s/p 1 u RBC 6/17, hgb 8.4, stable  C-collar - significant motion on CT, but no fx, unable to participate in clinical exam, likely not going to sit still for MRI if unable to sit still enough for CT. Flex ex performed 6/18 and c collar was removed.   FEN - Reg as tolerated, Cortrak and TF started 6/24 - will need to clarify with family how long we plan on doing that since PEG not desired VTE - SCD's, LMWH ID - Tdap given in ED Dispo - 6N, PT/OT/SLP, anticipate SNF   LOS: 8 days   I reviewed last 24 h vitals and pain scores, last 48 h intake and output, and last 24 h  labs and trends.    Adam Phenix, [facility] Surgery 01/12/2023, 7:45 AM Please see Amion for pager number during day hours 7:00am-4:30pm

## 2023-01-12 NOTE — Progress Notes (Addendum)
Speech Language Pathology Treatment: Dysphagia;Cognitive-Linquistic  Patient Details Name: Omar Kennedy MRN: [id number] DOB: 1946-01-11 Today's Date: 01/12/2023 Time: 2130-8657 SLP Time Calculation (min) (ACUTE ONLY): 12 min  Assessment / Plan / Recommendation Clinical Impression  Improved functional ability to make needs known than last few sessions with increased mean length of utterance. He was calling out when therapist arrived and stating "need to use the bathroom" and when told he has a Purewick stated "I need to go number 2." He was unable to accurately express that he was on the bedpan to this therapist. Pt was internally distracted by pain, positioning and bathroom for most of session although able to redirect with repetition.  Attempted to have him name objects and describe a picture scene although he could not sustain attention to this activity and was distracted.   He took 3 sips of juice via straw with an immediate cough on the 4th indicative of likely continued airway intrusion with thin. He politely declined a solid texture (graham cracker). Recommend pt continue with nectar thick liquids. Pt may need instrumental study to fully assess oropharyngeal phase of swallow if he would be able to participate.    HPI HPI: Omar Kennedy. Gear is a 77 y/o M with a PMH dementia who presented to the ED as a level 2 trauma after he was found down in an empty swimming pool behind his house. Sustained SAH/SDH, T11 fx, multiple thoracic and lumbar spine transverse process fractures, right clavicle and left scapula fracture, R HTX, R rib fx 5-6      SLP Plan  Continue with current plan of care      Recommendations for follow up therapy are one component of a multi-disciplinary discharge planning process, led by the attending physician.  Recommendations may be updated based on patient status, additional functional criteria and insurance authorization.    Recommendations  Diet recommendations:  Nectar-thick liquid;Regular Liquids provided via: Cup;Straw Medication Administration: Crushed with puree Supervision: Staff to assist with self feeding;Full supervision/cueing for compensatory strategies Compensations: Minimize environmental distractions;Slow rate;Small sips/bites Postural Changes and/or Swallow Maneuvers: Seated upright 90 degrees                  Oral care BID   Frequent or constant Supervision/Assistance Dysphagia, unspecified (R13.10);Aphasia (R47.01);Cognitive communication deficit (Q46.962)     Continue with current plan of care     Omar Kennedy  01/12/2023, 11:02 AM

## 2023-01-12 NOTE — Plan of Care (Signed)

## 2023-01-12 NOTE — Progress Notes (Signed)
Occupational Therapy Treatment Patient Details Name: Omar Kennedy MRN: [id number] DOB: 03/10/1946 Today's Date: 01/12/2023   History of present illness Pt is 77 yo male who presents on 01/04/23 after falling into empty pool. Sustained R scalp laceration, T11 fx, multiple T/L TP fxs, SAH/ SDH, R chest wall hematoma, R clavicle fx (NWB, sling), L scapula fx (WBAT), R rib fxs 5-6, R HTX. PMH: dementia   OT comments  Pt heard yelling out from inside room. Upon therapy arrival pt requested for washcloth to be able to wipe his nose. Pt verbalized that bedding was wet. Noted that purewick was disconnected. Session focused on direction following, safety awareness, bed mobility, and functional transfers while assisting pt with basic self care task. Pain limiting pt's progress and participation during session. Nursing provided pain meds during session. NT arrived mid session to assist OT. Recommend seeing pt for therapy when able to pre-medicate to decrease pain level. Pt continues to be appropriate for continued inpatient follow up therapy, <3 hours/day. OT will continue to follow patient acutely.     Recommendations for follow up therapy are one component of a multi-disciplinary discharge planning process, led by the attending physician.  Recommendations may be updated based on patient status, additional functional criteria and insurance authorization.    Assistance Recommended at Discharge Frequent or constant Supervision/Assistance  Patient can return home with the following  Two people to help with walking and/or transfers;Assistance with cooking/housework;Direct supervision/assist for financial management;Direct supervision/assist for medications management;Assistance with feeding;Assist for transportation;Help with stairs or ramp for entrance;Two people to help with bathing/dressing/bathroom   Equipment Recommendations  Other (comment) (defer to next level of care)       Precautions / Restrictions  Precautions Precautions: Back;Fall Precaution Booklet Issued: No Precaution Comments: pt unable to understand or retain precautions, will need cueing every time Required Braces or Orthoses: Sling;Spinal Brace Spinal Brace: Lumbar corset;Applied in sitting position Restrictions Weight Bearing Restrictions: Yes RUE Weight Bearing: Non weight bearing LUE Weight Bearing: Weight bearing as tolerated       Mobility Bed Mobility Overal bed mobility: Needs Assistance Bed Mobility: Supine to Sit, Sit to Supine Rolling: Mod assist, +2 for physical assistance (towards left side to place bedpan)   Supine to sit: HOB elevated, Max assist Sit to supine: Max assist, +2 for physical assistance   General bed mobility comments: Pain effecting pt's ability to participate and required greater physical assist to transition from supine to sitting EOB. VC provided on WB restrictions and back precautions although unable to verbalize understanding or demonstrate carry over. While sitting EOB, pt was educated on back precautions and to sit upright although he stated he couldn't and bent over resting all weight into BUE regardless of RUE WB restrictions. Patient Response: Anxious, Cooperative  Transfers Overall transfer level: Needs assistance Equipment used: 2 person hand held assist Transfers: Sit to/from Stand, Bed to chair/wheelchair/BSC Sit to Stand: Mod assist, From elevated surface     Step pivot transfers: Mod assist, +2 physical assistance, +2 safety/equipment, From elevated surface     General transfer comment: Performed partial sit to stand when initially standing from bed prior to transfer towards BSC. When transferring from [facility] back to bed, pt was able to demonstrate a full upright standing posture briefly although unable to tolerate and immediately wanted to sit. Was able to guide pt's hips towards bed to safely sit down.     Balance Overall balance assessment: Needs  assistance Sitting-balance support: Bilateral upper extremity supported, Feet  supported Sitting balance-Leahy Scale: Poor Sitting balance - Comments: sitting EOB. Pain limiting ability to maintain sitting balance.   Standing balance support: Single extremity supported, During functional activity Standing balance-Leahy Scale: Zero Standing balance comment: Reliant on therapist to maintain balance.       ADL either performed or assessed with clinical judgement   ADL Overall ADL's : Needs assistance/impaired Eating/Feeding: Set up;Sitting;Bed level Eating/Feeding Details (indicate cue type and reason): provided nectar thick water and pt able to hold onto cup and drink through straw successfully.     Upper Body Bathing: Total assistance;Sitting Upper Body Bathing Details (indicate cue type and reason): EOB. Pain limiting ability to participate Lower Body Bathing: Total assistance;Sit to/from stand;Cueing for safety;Cueing for back precautions   Upper Body Dressing : Total assistance;Sitting Upper Body Dressing Details (indicate cue type and reason): EOB. Pain limiting pt's ability to participate in task     Toilet Transfer: +2 for physical assistance;+2 for safety/equipment;Moderate assistance;BSC/3in1;Stand-pivot   Toileting- Clothing Manipulation and Hygiene: Total assistance;Sit to/from stand;Cueing for safety;Cueing for sequencing;Cueing for back precautions              Extremity/Trunk Assessment Upper Extremity Assessment RUE Deficits / Details: Pt placing weight into RUE numerous times during session even though educated not to. Sling re-adjusted for proper positioning although pt continues to attempt to use UE to brace self in sitting, hold onto bed rail, etc. LUE Deficits / Details: Demonstrated functional elbow flexion when provided dry washcloth to wipe his nose.             Cognition Arousal/Alertness: Awake/alert Behavior During Therapy: Anxious Overall  Cognitive Status: History of cognitive impairments - at baseline              General Comments: History of dementia. Able to follow 1 step commands with physical assist to initiate. Resistant to mobility d/t pain. Does not follow back precautions or WB restrictions on RUE even when educated multiple times during session.              General Comments Pt able to verbalize that the bed was wet. Male purwick tubing had been disconnected and bed was soiled with urine.  NT arrived mid session to provide assistance to OT and pt during clean up and toileting.    Pertinent Vitals/ Pain       Pain Assessment Pain Assessment: Faces Faces Pain Scale: Hurts whole lot Pain Location: reports pain in back during bed mobility and while seated EOB Pain Descriptors / Indicators: Discomfort, Grimacing, Moaning, Guarding Pain Intervention(s): Monitored during session, Repositioned, Patient requesting pain meds-RN notified, RN gave pain meds during session         Frequency  Min 1X/week        Progress Toward Goals  OT Goals(current goals can now be found in the care plan section)  Progress towards OT goals: Not progressing toward goals - comment (pain and cognition limiting pt's ability to participate fully and progress in therapy)     Plan Discharge plan remains appropriate;Frequency remains appropriate       AM-PAC OT "6 Clicks" Daily Activity     Outcome Measure   Help from another person eating meals?: A Lot Help from another person taking care of personal grooming?: A Lot Help from another person toileting, which includes using toliet, bedpan, or urinal?: Total Help from another person bathing (including washing, rinsing, drying)?: Total Help from another person to put on and taking off regular upper body clothing?:  Total Help from another person to put on and taking off regular lower body clothing?: Total 6 Click Score: 8    End of Session Equipment Utilized During Treatment:  Back brace;Other (comment) (sling)  OT Visit Diagnosis: Muscle weakness (generalized) (M62.81);Pain;History of falling (Z91.81) Pain - part of body:  (back, right arm)   Activity Tolerance Patient limited by pain   Patient Left in bed;with call bell/phone within reach;with bed alarm set;with restraints reapplied   Nurse Communication Mobility status;Patient requests pain meds        Time: 9147-8295 OT Time Calculation (min): 51 min  Charges: OT Treatments $Self Care/Home Management : 38-52 mins  Limmie Patricia, OTR/L,CBIS  Supplemental OT - [facility] and [facility] Secure Chat Preferred    Catlyn Shipton, Charisse March 01/12/2023, 10:22 AM

## 2023-01-13 LAB — GLUCOSE, CAPILLARY
Glucose-Capillary: 125 mg/dL — ABNORMAL HIGH (ref 70–99)
Glucose-Capillary: 125 mg/dL — ABNORMAL HIGH (ref 70–99)
Glucose-Capillary: 125 mg/dL — ABNORMAL HIGH (ref 70–99)
Glucose-Capillary: 128 mg/dL — ABNORMAL HIGH (ref 70–99)
Glucose-Capillary: 144 mg/dL — ABNORMAL HIGH (ref 70–99)
Glucose-Capillary: 146 mg/dL — ABNORMAL HIGH (ref 70–99)

## 2023-01-13 LAB — PHOSPHORUS
Phosphorus: 3 mg/dL (ref 2.5–4.6)
Phosphorus: 2.9 mg/dL (ref 2.5–4.6)

## 2023-01-13 LAB — MAGNESIUM
Magnesium: 1.9 mg/dL (ref 1.7–2.4)
Magnesium: 1.9 mg/dL (ref 1.7–2.4)

## 2023-01-13 NOTE — Progress Notes (Signed)
Progress Note     Subjective: No acute changes. Oriented to self only. Interval improvement in his speech and ability to articulate his needs/feelings.   He tells me that he does not want to eat. He denies pain. I told him I was going to update his wife and he said "that would be fine" followed by "tell her I'm not doing so well" and "thank you"   Objective: Vital signs in last 24 hours: Temp:  [97.4 F (36.3 C)-98.1 F (36.7 C)] 98 F (36.7 C) (06/26 0727) Pulse Rate:  [67-77] 77 (06/26 0727) Resp:  [16-18] 16 (06/26 0727) BP: (137-167)/(63-74) 167/74 (06/26 0727) SpO2:  [95 %-98 %] 98 % (06/26 0727) Weight:  [72 kg] 72 kg (06/26 0427) Last BM Date : 01/06/23  Intake/Output from previous day: 06/25 0701 - 06/26 0700 In: 2315.3 [P.O.:560; NG/GT:1755.3] Out: 1900 [Urine:1900] Intake/Output this shift: No intake/output data recorded.  PE: Gen: comfortable, no distress Head: R scalp lac c/d/I, no cellulitis or purulence  Neuro: oriented to self only HEENT: PERRL Neck: contusion over R neck. CV: RRR Pulm: right chest wall hematoma  Abd: soft, NT    Extr: wwp, no edema   Lab Results:  No results for input(s): "WBC", "HGB", "HCT", "PLT" in the last 72 hours.  BMET Recent Labs    01/11/23 0940 01/12/23 0425  NA 141 138  K 3.7 3.6  CL 106 106  CO2 24 25  GLUCOSE 105* 130*  BUN 18 25*  CREATININE 0.82 0.88  CALCIUM 8.4* 8.1*   PT/INR No results for input(s): "LABPROT", "INR" in the last 72 hours. CMP     Component Value Date/Time   NA 138 01/12/2023 0425   K 3.6 01/12/2023 0425   CL 106 01/12/2023 0425   CO2 25 01/12/2023 0425   GLUCOSE 130 (H) 01/12/2023 0425   BUN 25 (H) 01/12/2023 0425   CREATININE 0.88 01/12/2023 0425   CALCIUM 8.1 (L) 01/12/2023 0425   PROT 4.9 (L) 01/04/2023 1412   ALBUMIN 3.2 (L) 01/04/2023 1412   AST 69 (H) 01/04/2023 1412   ALT 40 01/04/2023 1412   ALKPHOS 52 01/04/2023 1412   BILITOT 2.4 (H) 01/04/2023 1412   GFRNONAA  >60 01/12/2023 0425   Lipase  No results found for: "LIPASE"     Studies/Results: DG Abd Portable 1V  Result Date: 01/11/2023 CLINICAL DATA:  Check gastric catheter placement EXAM: PORTABLE ABDOMEN - 1 VIEW COMPARISON:  None Available. FINDINGS: Weighted feeding catheter is noted in the distal stomach. Scattered large and small bowel gas is noted. IMPRESSION: Weighted feeding catheter in the distal stomach. Electronically Signed   By: Alcide Clever M.D.   On: 01/11/2023 10:40    Anti-infectives: Anti-infectives (From admission, onward)    Start     Dose/Rate Route Frequency Ordered Stop   01/04/23 1530  ceFAZolin (ANCEF) IVPB 2g/100 mL premix        2 g 200 mL/hr over 30 Minutes Intravenous  Once 01/04/23 1526 01/04/23 1634        Assessment/Plan  Found down in an empty swimming pool    R scalp laceration - closed in ED with 9 staples on 6/17; remove staples 6/27 T11 fracture, multiple thoracic and lumbar spine transverse process fractures - per NS c/s, mobilize in brace SAH/SDH - NS c/s Dr. Yetta Barre, repeat CT stable 6/18, IV keppra x 7 days; Neuro consult to rule out other sources of AMS. EEG without epileptiform changes. MRI stable with bilateral  subdural hematoma. Neuro signed off.  R chest wall hematoma - stable R clavicle fracture - ortho c/s, sling, NWB L scapula fracture - ortho c/s, WBAT R HTX, R Fib FX 5-6, small PTX- Multimodal pain control, IS, follow up CXR 6/18 without PTX, Follow up CT without increase in HTX and small (<5%) PTX ABL anemia - s/p 1 u RBC 6/17, hgb 8.4, stable  C-collar - significant motion on CT, but no fx, unable to participate in clinical exam, likely not going to sit still for MRI if unable to sit still enough for CT. Flex ex performed 6/18 and c collar was removed.   FEN - Reg diet/nectar thick as tolerated, Cortrak and TF started 6/24 - I had a discussion with the patients wife, [name], on the phone. She confirms that the patient would not want a  PEG tube. She expresses understanding that without adequate PO nutrition and without tube feedings he will not get enough calories to sustain life. She agrees with the plan to discontinue to the tube feeds tomorrow and monitor the patients PO intake while we work on placement in a SNF or memory care facility. VTE - SCD's, LMWH ID - Tdap given in ED Dispo - 6N, PT/OT/SLP, anticipate SNF  Patients wife expresses interest in buying a home in [place] where her daughter lives. There is a memory care center there that she has already toured and she would ultimately like for her husband to stay there. Patient will likely need SNF or memory care center in [place] acutely, followed by transfer to [place] with his family once that can be arranged. I will have our case manager, Raynelle Fanning, discuss the logistics of this plan with family.    LOS: 9 days   I reviewed last 24 h vitals and pain scores, last 48 h intake and output, and last 24 h labs and trends.    Adam Phenix, [facility] 01/13/2023, 8:09 AM Please see Amion for pager number during day hours 7:00am-4:30pm

## 2023-01-13 NOTE — Progress Notes (Signed)
Speech Language Pathology Treatment: Dysphagia;Cognitive-Linquistic  Patient Details Name: DAXX TIGGS MRN: [id number] DOB: 1945-12-21 Today's Date: 01/13/2023 Time: 7829-5621 SLP Time Calculation (min) (ACUTE ONLY): 22 min  Assessment / Plan / Recommendation Clinical Impression  Pt did not want to work with SLP and needed max encouragement. He is internally distracted by his thoughts of wanting to leave, on his bowel movements etc. Per chart and RN he is refusing most po's/meals. He did self feed 2 sips of trials of thin grape juice from cup without signs of aspiration. Pt's wife stated he coughed with liquids prior to admission and may have periods of coughing although do no feel he would be able to participate in MBS. Wife in agreement to returning to thin liquids, sitting upright drinking from cup, no straws, crush pills.  In regards to his comprehension and expressive language difficulties, this was occurring prior to this admission and friend stated "doesn't have anything to do with the fall." There are periods when it may be exacerbated by his situation. The wife was updated on swallow and language/cognition status up to this point. Spoke with pt and encouraged him to try to eat as much as possible and he stated "I just can't".  Asked the wife to bring in foods pt likes and may eat.   HPI HPI: Cobain Morici. Karlen is a 77 y/o M with a PMH dementia who presented to the ED as a level 2 trauma after he was found down in an empty swimming pool behind his house. Sustained SAH/SDH, T11 fx, multiple thoracic and lumbar spine transverse process fractures, right clavicle and left scapula fracture, R HTX, R rib fx 5-6      SLP Plan  Continue with current plan of care      Recommendations for follow up therapy are one component of a multi-disciplinary discharge planning process, led by the attending physician.  Recommendations may be updated based on patient status, additional functional criteria and  insurance authorization.    Recommendations  Diet recommendations: Regular;Thin liquid Liquids provided via: Cup;No straw Medication Administration: Crushed with puree Supervision: Staff to assist with self feeding;Full supervision/cueing for compensatory strategies Compensations: Minimize environmental distractions;Slow rate;Small sips/bites Postural Changes and/or Swallow Maneuvers: Seated upright 90 degrees                  Oral care BID   Frequent or constant Supervision/Assistance Dysphagia, unspecified (R13.10);Aphasia (R47.01);Cognitive communication deficit (H08.657)     Continue with current plan of care     Royce Macadamia  01/13/2023, 3:13 PM

## 2023-01-13 NOTE — Progress Notes (Signed)
PT Cancellation Note  Patient Details Name: Omar Kennedy MRN: [id number] DOB: 29-May-1946   Cancelled Treatment:    Reason Eval/Treat Not Completed: (P) Patient declined, no reason specified (Pt presents with confusion but more agitated and very worked up about a women being in his room who was supposed to come back but could not tell me why or what she was there for.  He declined OOB despite education and encouragement.)   Jana Swartzlander Artis Delay 01/13/2023, 4:59 PM  Bonney Leitz , PTA Acute Rehabilitation Services Office [phone]

## 2023-01-13 NOTE — TOC Progression Note (Signed)
Transition of Care [facility]) - Progression Note    Patient Details  Name: IKAIKA SHOWERS MRN: [id number] Date of Birth: 1945-11-24  Transition of Care [facility]) CM/SW Contact  Astrid Drafts Berna Spare, RN Phone Number: 01/13/2023, 2:05 PM  Clinical Narrative:    Spoke with patient's wife, [name], regarding discharge planning.  She inquired about [facility] and [facility], and they have both declined SNF admission.  Whitney at [facility] requests that referral be resent for review; referral sent as requested. Plan for Cortrak to be dc next 24h or so; will continue discussions with family on disposition.    Expected Discharge Plan: Skilled Nursing Facility Barriers to Discharge: English as a second language teacher, Other (must enter comment) (snf choice)  Expected Discharge Plan and Services   Discharge Planning Services: CM Consult   Living arrangements for the past 2 months: Single Family Home                                       Social Determinants of Health (SDOH) Interventions    Readmission Risk Interventions     No data to display         Quintella Baton, RN, BSN  Trauma/Neuro ICU Case Manager [phone]

## 2023-01-14 DIAGNOSIS — Z7189 Other specified counseling: Secondary | ICD-10-CM

## 2023-01-14 DIAGNOSIS — W19XXXA Unspecified fall, initial encounter: Secondary | ICD-10-CM

## 2023-01-14 DIAGNOSIS — W1789XA Other fall from one level to another, initial encounter: Secondary | ICD-10-CM

## 2023-01-14 DIAGNOSIS — S069XAA Unspecified intracranial injury with loss of consciousness status unknown, initial encounter: Secondary | ICD-10-CM

## 2023-01-14 DIAGNOSIS — S0101XA Laceration without foreign body of scalp, initial encounter: Secondary | ICD-10-CM

## 2023-01-14 DIAGNOSIS — S2249XA Multiple fractures of ribs, unspecified side, initial encounter for closed fracture: Secondary | ICD-10-CM

## 2023-01-14 DIAGNOSIS — Z711 Person with feared health complaint in whom no diagnosis is made: Secondary | ICD-10-CM

## 2023-01-14 DIAGNOSIS — S42109A Fracture of unspecified part of scapula, unspecified shoulder, initial encounter for closed fracture: Secondary | ICD-10-CM | POA: Diagnosis not present

## 2023-01-14 DIAGNOSIS — Z66 Do not resuscitate: Secondary | ICD-10-CM

## 2023-01-14 DIAGNOSIS — Z789 Other specified health status: Secondary | ICD-10-CM

## 2023-01-14 DIAGNOSIS — Z515 Encounter for palliative care: Secondary | ICD-10-CM

## 2023-01-14 DIAGNOSIS — T1490XA Injury, unspecified, initial encounter: Secondary | ICD-10-CM

## 2023-01-14 LAB — BASIC METABOLIC PANEL
Anion gap: 13 (ref 5–15)
BUN: 18 mg/dL (ref 8–23)
CO2: 20 mmol/L — ABNORMAL LOW (ref 22–32)
Calcium: 8.3 mg/dL — ABNORMAL LOW (ref 8.9–10.3)
Chloride: 96 mmol/L — ABNORMAL LOW (ref 98–111)
Creatinine, Ser: 0.69 mg/dL (ref 0.61–1.24)
GFR, Estimated: >60 mL/min (ref >60)
Glucose, Bld: 152 mg/dL — ABNORMAL HIGH (ref 70–99)
Potassium: 4.3 mmol/L (ref 3.5–5.1)
Sodium: 131 mmol/L — ABNORMAL LOW (ref 135–145)

## 2023-01-14 LAB — GLUCOSE, CAPILLARY
Glucose-Capillary: 111 mg/dL — ABNORMAL HIGH (ref 70–99)
Glucose-Capillary: 133 mg/dL — ABNORMAL HIGH (ref 70–99)
Glucose-Capillary: 134 mg/dL — ABNORMAL HIGH (ref 70–99)
Glucose-Capillary: 149 mg/dL — ABNORMAL HIGH (ref 70–99)
Glucose-Capillary: 163 mg/dL — ABNORMAL HIGH (ref 70–99)

## 2023-01-14 LAB — MAGNESIUM
Magnesium: 1.9 mg/dL (ref 1.7–2.4)
Magnesium: 1.9 mg/dL (ref 1.7–2.4)

## 2023-01-14 LAB — PHOSPHORUS
Phosphorus: 3.2 mg/dL (ref 2.5–4.6)
Phosphorus: 3.2 mg/dL (ref 2.5–4.6)

## 2023-01-14 NOTE — Progress Notes (Signed)
Pt given zofran vomited two times this evening.

## 2023-01-14 NOTE — Progress Notes (Signed)
Nutrition Follow-up  DOCUMENTATION CODES:   Severe malnutrition in context of chronic illness  INTERVENTION:  Tube Feeds via Cortrak: Osmolite 1.5 at 55 mL/hr  60 mL ProSource TF20 - BID Free water flush: 150 mL q4h Tube feeds at goal provides 2140 kcal, 123 gm protein, and 1903 mL total free water daily. Discontinue Magic Cup Feeding assist with all meals Continue Multivitamin w/ minerals daily  NUTRITION DIAGNOSIS:  Severe Malnutrition related to chronic illness as evidenced by severe fat depletion, severe muscle depletion. - Ongoing, being addressed via TF  GOAL:  Patient will meet greater than or equal to 90% of their needs - Being met via TF  MONITOR:  TF tolerance, PO intake, Supplement acceptance, Labs, Weight trends  REASON FOR ASSESSMENT:  Consult Enteral/tube feeding initiation and management  ASSESSMENT:  77 yo male admitted after being found down in empty swimming pool with multiple injuries including T11 fracture with multiple thoracic and lumbar spine transverse process fractures, SAH/SDH, R clavicle fx, L scapular fx, R fib Fx, R chest wall hematoma. PMH includes dementia  6/17 - Admitted 6/24 - Cortrak placed   Pt laying in bed, unable to provide any information to RD. Spoke with RN outside of room, reports that pt has said that he does not want to eat. RN reports that he did ask for applesauce and when she tried to give it he no longer wanted it.   Per Trauma PA's note, plan to continue Cortrak and TF until Palliative care can meet with pt wife and have GOC conversations.   Meal Intake  6/22-6/25: 0-25% x 7 meals   Medications reviewed and include: Calcium Carbonate, Vitamin D3, MVI, Miralax, Thiamine, Vitamin B12 Labs reviewed: Sodium 131, Potassium 4.3, Phosphorus 3.2, Magnesium 1.9 CBG: 125-163 x 24 hrs   Diet Order:   Diet Order             Diet regular Room service appropriate? No; Fluid consistency: Thin  Diet effective now                    EDUCATION NEEDS:   Education needs have been addressed (pt's wife and sister-in-law at bedside)  Skin:  Skin Assessment: Skin Integrity Issues: Skin Integrity Issues:: Other (Comment) Other: facial laceration  Last BM:  6/26  Height:   Ht Readings from Last 1 Encounters:  01/04/23 5\' 10"  (1.778 m)    Weight:   Wt Readings from Last 1 Encounters:  01/14/23 70.1 kg   BMI:  Body mass index is 22.17 kg/m.  Estimated Nutritional Needs:  Kcal:  2000-2200 kcals Protein:  115-130 g Fluid:  >/= 2L   Kirby Crigler RD, LDN Clinical Dietitian See [facility] for contact information.

## 2023-01-14 NOTE — Consult Note (Signed)
Consultation Note Date: 01/14/2023   Patient Name: Omar Kennedy  DOB: 1945/10/16  MRN: [id number]  Age / Sex: 77 y.o., male  PCP: [facility] Referring Physician: Md, Trauma, MD  Reason for Consultation: Establishing goals of care, "goals of care - dementia, TBI, not eating."  HPI/Patient Profile: 77 y.o. male  with past medical history of Alzheimers and vascular dementia presented to the ED from home after being found down in an empty swimming pool. Patient was admitted on 01/04/2023 with right scalp fracture, multiple thoracic and lumbar spine transverse process fractures, SAH/SDH, right chest wall hematoma, right clavical fracture, left scapula fracture. Coretrak was place on 01/11/23 due to severe malnutrition and poor oral intake.   Clinical Assessment and Goals of Care: I have reviewed medical records including EPIC notes, labs, and imaging. Coretrak to be removed today or tomorrow. Received report from primary RN - no acute concerns.    Went to visit patient at bedside - no family/visitors present. Patient was lying in bed awake, alert, oriented to self only, and able to participate in simple conversation though thoughts are sporadic. He is not able to make complex medical decisions. No signs or non-verbal gestures of pain or discomfort noted. No respiratory distress, increased work of breathing, or secretions noted.   12:45 - 1:10 PM Met with spouse/[name] via phone  to discuss diagnosis, prognosis, GOC, EOL wishes, disposition, and options.  I introduced Palliative Medicine as specialized medical care for people living with serious illness. It focuses on providing relief from the symptoms and stress of a serious illness. The goal is to improve quality of life for both the patient and the family.  We discussed a brief life review of the patient as well as functional and nutritional status.   Patient and his wife have three daughters. Prior to hospitalization, patient was [place] He was able to ambulate, though [name] states "his dementia was getting bad." For the last several months patient was having a hard time understanding simple commands or recognizing objects. Therapeutic listening provided as [name] reflects on patient's "horrible accident" that lead to his admission.   We discussed patient's current illness and what it means in the larger context of patient's on-going co-morbidities. [name] understands that dementia is a progressive, non-curable disease underlying the patient's current acute medical conditions. [name] has a clear understanding of patient's current acute medical situation. Natural disease trajectory and expectations at EOL were discussed. I attempted to elicit values and goals of care important to the patient. The difference between aggressive medical intervention and comfort care was considered in light of the patient's goals of care. [name] confirms goals are to remove coretrak and let nature take it's course. Family are not interested in pursuing PEG tube. She shares that patient has told her recently, "I am not happy," "this is not life." She "does not want to keep him alive just to exist." We discussed the anticipated trajectory of his decreased oral intake and how poor intake is a poor prognostic indicator.  Discussed that the goal of rehab is improvement/stabalization of functional status,  which can be a difficult goal to meet for patients with advanced illness and multiple medical conditions. Reviewed what is needed for someone to have a positive rehabilitation experience to include adequate nutritional intake as well as willingness/ability to participate.  Provided education and counseling at length on the philosophy and benefits of hospice care. Discussed that it  offers a holistic approach to care in the setting of  end-stage illness, and is about supporting the patient where they are allowing nature to take it's course. Discussed the hospice team includes RNs, physicians, social workers, and chaplains. They can provide personal care, support for the family, and help keep patient out of the hospital as well as assist with DME needs for home hospice. Education provided on the difference between home vs residential hospice.   [name] reiterates pain control would be important to him and to family.  Hospice and Palliative Care services outpatient were explained and offered.  We discussed discharge options to include rehab with outpatient Palliative Care vs LTC +/- hospice vs home hospice with private duty caregivers vs residential hospice.  [name] would like her two daughters to be involved in decisions regarding hospice. Offered conference call but daughters are not available at this time due to work. [name] plans on coming to the hospital at 3:30p and will see if they are available at that time. If not, she would like to scheduled a meeting tomorrow.   3:30 - 4:30 PM Met patient's wife/[name] and her sister/[name] at bedside - moved to [facility] conference room where daughter/[name] and her fiance/[name] as well as daughter/[name] joined meeting via speakerphone.   Reviewed all information as outlined above in detail. Family confirm patient's quality of life was poor prior to his accident.   We also talked about transition to comfort measures in house and what that would entail inclusive of medications to control pain, dyspnea, agitation, nausea, and itching. We discussed stopping all unnecessary measures such as blood draws, needle sticks, oxygen, antibiotics, CBGs/insulin, cardiac monitoring, IVF, and frequent vital signs. Education provided that other non-pharmacological interventions would be utilized for holistic support and comfort such as spiritual support if requested, repositioning, music therapy, offering comfort feeds,  and/or therapeutic listening. All care would focus on how the patient is looking and feeling.   Allowed space and time for family to discuss information. After discussion, family would like coretrak pulled today and allow 48 hours of watchful waiting. If patient's appetite does not improve by Saturday, they will be ready for his transition to full comfort measures and would like residential hospice referral, requesting [facility]. Family are interested in touring [facility] Sunday if they make the final decision for hospice.   Visit also consisted of discussions dealing with the complex and emotionally intense issues of symptom management and palliative care in the setting of serious and potentially life-threatening illness.   Discussed with family the importance of continued conversation with each other and the medical providers regarding overall plan of care and treatment options, ensuring decisions are within the context of the patient's values and GOCs.    Questions and concerns were addressed. The family was encouraged to call with questions and/or concerns. PMT card was provided.   Primary Decision Maker: NEXT OF KIN - wife/[name] Dahm    SUMMARY OF RECOMMENDATIONS   Goal is to pull coretrak today and allow 48 hours of watchful waiting. If patient's appetite does not improve by Saturday 6/29, family will be open for his transition to full comfort care and residential hospice referral at that time, requesting [facility] No PEG Continue DNR/DNI as previously documented - durable DNR form completed and placed in shadow chart. Copy was made and will be scanned into Vynca/ACP tab Continue scheduled tylenol and PRN oxycodone for pain PMT will continue to follow and support holistically   Code Status/Advance Care  Planning: DNR  Palliative Prophylaxis:  Aspiration, Delirium Protocol, Frequent Pain Assessment, Oral Care, and Turn Reposition  Additional Recommendations (Limitations,  Scope, Preferences): Full Scope Treatment, No Artificial Feeding, and No Tracheostomy  Psycho-social/Spiritual:  Desire for further Chaplaincy support:no Created space and opportunity for patient and family to express thoughts and feelings regarding patient's current medical situation.  Emotional support and therapeutic listening provided.  Prognosis:  Unable to determine  Discharge Planning: To Be Determined      Primary Diagnoses: Present on Admission: **None**   I have reviewed the medical record, interviewed the patient and family, and examined the patient. The following aspects are pertinent.  History reviewed. No pertinent past medical history. Social History   Socioeconomic History   Marital status: Married    Spouse name: Not on file   Number of children: Not on file   Years of education: Not on file   Highest education level: Not on file  Occupational History   Not on file  Tobacco Use   Smoking status: Not on file   Smokeless tobacco: Not on file  Substance and Sexual Activity   Alcohol use: Not on file   Drug use: Not on file   Sexual activity: Not on file  Other Topics Concern   Not on file  Social History Narrative   Not on file   Social Determinants of Health   Financial Resource Strain: Not on file  Food Insecurity: Not on file  Transportation Needs: Not on file  Physical Activity: Not on file  Stress: Not on file  Social Connections: Not on file   History reviewed. No pertinent family history. Scheduled Meds:  (feeding supplement) PROSource Plus  30 mL Oral BID BM   sodium chloride   Intravenous Once   acetaminophen  1,000 mg Oral Q6H   calcium carbonate  1,250 mg Oral Q breakfast   carvedilol  6.25 mg Oral BID WC   cholecalciferol  1,000 Units Oral Daily   docusate sodium  100 mg Oral BID   enoxaparin (LOVENOX) injection  30 mg Subcutaneous Q12H   escitalopram  5 mg Oral Daily   fluticasone  1 spray Each Nare QPM   free water  150 mL  Per Tube Q4H   lidocaine  2 patch Transdermal Q24H   loratadine  10 mg Oral Daily   multivitamin with minerals  1 tablet Oral Daily   mouth rinse  15 mL Mouth Rinse 4 times per day   polyethylene glycol  17 g Per Tube BID   QUEtiapine  25 mg Oral QHS   rivastigmine  3 mg Oral BID   rosuvastatin  40 mg Oral QHS   thiamine  100 mg Per Tube Daily   vitamin B-12  100 mcg Oral Daily   Continuous Infusions:  feeding supplement (OSMOLITE 1.5 CAL) 1,000 mL (01/13/23 1320)   PRN Meds:.haloperidol lactate, hydrALAZINE, metoprolol tartrate, ondansetron **OR** ondansetron (ZOFRAN) IV, mouth rinse, oxyCODONE Medications Prior to Admission:  Prior to Admission medications   Medication Sig Start Date End Date Taking? Authorizing Provider  acetaminophen (TYLENOL) 500 MG tablet Take 1,000 mg by mouth every 6 (six) hours as needed for headache.   Yes [provider]  amLODipine (NORVASC) 5 MG tablet Take 5 mg by mouth daily. 12/22/22  Yes [provider]  aspirin EC 81 MG tablet Take 81 mg by mouth daily. Swallow whole.   Yes [provider]  calcium carbonate (OS-CAL) 1250 (500 Ca) MG chewable tablet Chew  1 tablet by mouth daily.   Yes [provider]  carvedilol (COREG) 12.5 MG tablet Take 6.25 mg by mouth daily. 07/29/22  Yes [provider]  cetirizine (ZYRTEC) 10 MG tablet Take 10 mg by mouth daily.   Yes [provider]  cholecalciferol (VITAMIN D3) 25 MCG (1000 UNIT) tablet Take 1,000 Units by mouth daily. gummies   Yes [provider]  escitalopram (LEXAPRO) 5 MG tablet Take 5 mg by mouth daily. 12/22/22  Yes [provider]  fluticasone (FLONASE) 50 MCG/ACT nasal spray Place 1 spray into both nostrils every evening.   Yes [provider]  Multiple Vitamins-Minerals (HAIR SKIN AND NAILS FORMULA PO) Take 1 tablet by mouth daily.   Yes [provider]  pantoprazole (PROTONIX) 40 MG tablet Take 40 mg by mouth at  bedtime. 12/29/22  Yes [provider]  rivastigmine (EXELON) 3 MG capsule Take 3 mg by mouth 2 (two) times daily. 11/30/22  Yes [provider]  rosuvastatin (CRESTOR) 40 MG tablet Take 40 mg by mouth at bedtime. 10/13/22  Yes [provider]  vitamin B-12 (CYANOCOBALAMIN) 100 MCG tablet Take 100 mcg by mouth daily. gummies   Yes [provider]   Allergies  Allergen Reactions   Cephalexin Other (See Comments)    vertigo   Review of Systems  Unable to perform ROS: Dementia    Physical Exam Vitals and nursing note reviewed.  Constitutional:      General: He is not in acute distress. Pulmonary:     Effort: No respiratory distress.  Skin:    General: Skin is warm and dry.     Comments: yellow  Neurological:     Mental Status: He is alert. He is disoriented and confused.     Motor: Weakness present.  Psychiatric:        Attention and Perception: He is inattentive.        Behavior: Behavior is cooperative.        Judgment: Judgment is impulsive.     Vital Signs: BP (!) 184/53 (BP Location: Right Leg)   Pulse 73   Temp 98.2 F (36.8 C)   Resp 18   Ht 5\' 10"  (1.778 m)   Wt 70.1 kg   SpO2 97%   BMI 22.17 kg/m  Pain Scale: 0-10 POSS *See Group Information*: S-Acceptable,Sleep, easy to arouse Pain Score: 6    SpO2: SpO2: 97 % O2 Device:SpO2: 97 % O2 Flow Rate: .O2 Flow Rate (L/min): 2 L/min  IO: Intake/output summary:  Intake/Output Summary (Last 24 hours) at 01/14/2023 1211 Last data filed at 01/14/2023 0358 Gross per 24 hour  Intake --  Output 850 ml  Net -850 ml    LBM: Last BM Date : 01/13/23 Baseline Weight: Weight: 77.1 kg Most recent weight: Weight: 70.1 kg     Palliative Assessment/Data: PPS 30%     Time In-Out: 1210-1310/1530-1630 Time Total: 120 minutes  Greater than 50%  of this time was spent counseling and coordinating care related to the above assessment and plan.  Signed by: Haskel Khan, NP   Please  contact Palliative Medicine Team phone at [phone] for questions and concerns.  For individual provider: See Amion  *Portions of this note are a verbal dictation therefore any spelling and/or grammatical errors are due to the "[employer]agon [employer] One" system interpretation.  z

## 2023-01-14 NOTE — Progress Notes (Signed)
Physical Therapy Treatment Patient Details Name: BRODY BONNEAU MRN: [id number] DOB: 1946/06/23 Today's Date: 01/14/2023   History of Present Illness Pt is 77 yo male who presents on 01/04/23 after falling into empty pool. Sustained R scalp laceration, T11 fx, multiple T/L TP fxs, SAH/ SDH, R chest wall hematoma, R clavicle fx (NWB, sling), L scapula fx (WBAT), R rib fxs 5-6, R HTX. PMH: dementia    PT Comments    Pt was seen initially agreeing to get OOB and then declines, eventually describing a fullness in abd area that hinders movement.  Pt is assisted to do ROM and strengthening to BLE's with cues for direction and safety of movement.  Pt is limited mainly by his lumbar fractures, but remained in bed and was stable.  Pt is lifting up his R arm off the bed voluntarily, requires reminders for all precautions.  Will follow along for transfers OOB to chair and to work on LE control for standing and balance, to take steps as able.  Follow acutely for goals of PT and encourage OOB to chair.   Recommendations for follow up therapy are one component of a multi-disciplinary discharge planning process, led by the attending physician.  Recommendations may be updated based on patient status, additional functional criteria and insurance authorization.  Follow Up Recommendations  Can patient physically be transported by private vehicle: No    Assistance Recommended at Discharge Frequent or constant Supervision/Assistance  Patient can return home with the following A lot of help with walking and/or transfers;A lot of help with bathing/dressing/bathroom;Assistance with cooking/housework;Assistance with feeding;Direct supervision/assist for medications management;Direct supervision/assist for financial management;Assist for transportation;Help with stairs or ramp for entrance   Equipment Recommendations  Other (comment) (TBD)    Recommendations for Other Services       Precautions / Restrictions  Precautions Precautions: Back;Fall Precaution Booklet Issued: No Precaution Comments: pt unable to understand or retain precautions, will need cueing every time Required Braces or Orthoses: Sling;Spinal Brace Spinal Brace: Lumbar corset;Applied in sitting position Restrictions Weight Bearing Restrictions: Yes RUE Weight Bearing: Non weight bearing LUE Weight Bearing: Weight bearing as tolerated     Mobility  Bed Mobility Overal bed mobility: Needs Assistance             General bed mobility comments: declining up to side of bed    Transfers Overall transfer level: Needs assistance                 General transfer comment: unable to get pt OOB    Ambulation/Gait                   Stairs             Wheelchair Mobility    Modified Rankin (Stroke Patients Only)       Balance                                            Cognition Arousal/Alertness: Awake/alert Behavior During Therapy: Restless, Anxious Overall Cognitive Status: History of cognitive impairments - at baseline                                 General Comments: History of dementia. Able to follow 1 step commands with physical assist to initiate. Resistant to mobility d/t pain. Does  not follow back precautions or WB restrictions on RUE even when educated multiple times during session.        Exercises General Exercises - Lower Extremity Ankle Circles/Pumps: AAROM, 10 reps Quad Sets: AROM, 10 reps Heel Slides: AAROM, 10 reps Hip ABduction/ADduction: AAROM, 10 reps Straight Leg Raises: AAROM, 10 reps    General Comments General comments (skin integrity, edema, etc.): Pt was assisted to get through exercises to BLE's with help to sequence and with direction, declines OOB due to feeling full in abd but is on TPN and has a tray in front of him      Pertinent Vitals/Pain Pain Assessment Pain Assessment: Faces Faces Pain Scale: Hurts little  more Pain Location: abd and R shoulder Pain Descriptors / Indicators: Guarding, Discomfort Pain Intervention(s): Limited activity within patient's tolerance, Monitored during session, Premedicated before session, Repositioned    Home Living                          Prior Function            PT Goals (current goals can now be found in the care plan section) Acute Rehab PT Goals Patient Stated Goal: none stated    Frequency    Min 3X/week      PT Plan Current plan remains appropriate    Co-evaluation              AM-PAC PT "6 Clicks" Mobility   Outcome Measure  Help needed turning from your back to your side while in a flat bed without using bedrails?: Total Help needed moving from lying on your back to sitting on the side of a flat bed without using bedrails?: Total Help needed moving to and from a bed to a chair (including a wheelchair)?: Total Help needed standing up from a chair using your arms (e.g., wheelchair or bedside chair)?: Total Help needed to walk in hospital room?: Total Help needed climbing 3-5 steps with a railing? : Total 6 Click Score: 6    End of Session   Activity Tolerance: Treatment limited secondary to medical complications (Comment) Patient left: in bed;with call bell/phone within reach;with bed alarm set Nurse Communication: Mobility status PT Visit Diagnosis: Pain;History of falling (Z91.81);Difficulty in walking, not elsewhere classified (R26.2) Pain - Right/Left: Right Pain - part of body: Shoulder     Time: 4098-1191 PT Time Calculation (min) (ACUTE ONLY): 28 min  Charges:  $Therapeutic Exercise: 8-22 mins $Therapeutic Activity: 8-22 mins                    Ivar Drape 01/14/2023, 3:58 PM  Samul Dada, PT PhD Acute Rehab Dept. Number: [facility] [id number] and [facility] [phone]

## 2023-01-14 NOTE — Plan of Care (Signed)
°  Problem: Pain Managment: °Goal: General experience of comfort will improve °Outcome: Progressing °  °Problem: Safety: °Goal: Ability to remain free from injury will improve °Outcome: Progressing °  °Problem: Clinical Measurements: °Goal: Ability to maintain clinical measurements within normal limits will improve °Outcome: Progressing °  °

## 2023-01-14 NOTE — Progress Notes (Addendum)
Progress Note     Subjective: No acute changes. Oriented to self only. Interval improvement in his speech and ability to articulate his needs/feelings.  Denies pain. States he feels tired. Denies nausea.   Objective: Vital signs in last 24 hours: Temp:  [97.7 F (36.5 C)-98.2 F (36.8 C)] 98.2 F (36.8 C) (06/27 0840) Pulse Rate:  [60-73] 73 (06/27 0840) Resp:  [16-18] 18 (06/27 0840) BP: (116-184)/(53-98) 184/53 (06/27 0840) SpO2:  [95 %-97 %] 97 % (06/27 0840) Weight:  [70.1 kg] 70.1 kg (06/27 0358) Last BM Date : 01/13/23  Intake/Output from previous day: 06/26 0701 - 06/27 0700 In: -  Out: 850 [Urine:850] Intake/Output this shift: No intake/output data recorded.  PE: Gen: comfortable, no distress Head: R scalp lac c/d/I, no cellulitis or purulence  Neuro: oriented to self only HEENT: PERRL Neck: contusion over R neck. CV: RRR Pulm: right chest wall hematoma  Abd: soft, NT, nondistended. Extr: wwp, no edema   Lab Results:  No results for input(s): "WBC", "HGB", "HCT", "PLT" in the last 72 hours.  BMET Recent Labs    01/11/23 0940 01/12/23 0425  NA 141 138  K 3.7 3.6  CL 106 106  CO2 24 25  GLUCOSE 105* 130*  BUN 18 25*  CREATININE 0.82 0.88  CALCIUM 8.4* 8.1*   PT/INR No results for input(s): "LABPROT", "INR" in the last 72 hours. CMP     Component Value Date/Time   NA 138 01/12/2023 0425   K 3.6 01/12/2023 0425   CL 106 01/12/2023 0425   CO2 25 01/12/2023 0425   GLUCOSE 130 (H) 01/12/2023 0425   BUN 25 (H) 01/12/2023 0425   CREATININE 0.88 01/12/2023 0425   CALCIUM 8.1 (L) 01/12/2023 0425   PROT 4.9 (L) 01/04/2023 1412   ALBUMIN 3.2 (L) 01/04/2023 1412   AST 69 (H) 01/04/2023 1412   ALT 40 01/04/2023 1412   ALKPHOS 52 01/04/2023 1412   BILITOT 2.4 (H) 01/04/2023 1412   GFRNONAA >60 01/12/2023 0425   Lipase  No results found for: "LIPASE"     Studies/Results: No results found.  Anti-infectives: Anti-infectives (From  admission, onward)    Start     Dose/Rate Route Frequency Ordered Stop   01/04/23 1530  ceFAZolin (ANCEF) IVPB 2g/100 mL premix        2 g 200 mL/hr over 30 Minutes Intravenous  Once 01/04/23 1526 01/04/23 1634        Assessment/Plan  Found down in an empty swimming pool    R scalp laceration - closed in ED with 9 staples on 6/17; remove staples 6/27 T11 fracture, multiple thoracic and lumbar spine transverse process fractures - per NS c/s, mobilize in brace SAH/SDH - NS c/s Dr. Yetta Barre, repeat CT stable 6/18, IV keppra x 7 days; Neuro consult to rule out other sources of AMS. EEG without epileptiform changes. MRI stable with bilateral subdural hematoma. Neuro signed off.  R chest wall hematoma - stable R clavicle fracture - ortho c/s, sling, NWB L scapula fracture - ortho c/s, WBAT R HTX, R Fib FX 5-6, small PTX- Multimodal pain control, IS, follow up CXR 6/18 without PTX, Follow up CT without increase in HTX and small (<5%) PTX ABL anemia - s/p 1 u RBC 6/17, hgb 8.4, stable  C-collar - significant motion on CT, but no fx, unable to participate in clinical exam, likely not going to sit still for MRI if unable to sit still enough for CT. Flex ex performed  6/18 and c collar was removed.   FEN - Reg diet/nectar thick as tolerated, Cortrak and TF started 6/24 -continue today until patient seen by palliative to discuss GOC VTE - SCD's, LMWH ID - Tdap given in ED Dispo - 6N, PT/OT/SLP, palliative consult. I spoke to his wife [name] over the phone and she is agreeable to this consult. She was visiting yesterday afternoon and she says he seemed depressed. I also had a long conversation with the patients daughter, Dr. Faythe Dingwall, who also agrees with palliative consult.   Noted emesis overnight. He has a benign abd exam, has had a BM in the last 48 hours and appears well this morning. Will monitor. Continue tube feeedings   LOS: 10 days   I reviewed last 24 h vitals and pain scores, last  48 h intake and output, and last 24 h labs and trends.    Adam Phenix, [facility] Surgery 01/14/2023, 9:13 AM Please see Amion for pager number during day hours 7:00am-4:30pm

## 2023-01-15 DIAGNOSIS — R638 Other symptoms and signs concerning food and fluid intake: Secondary | ICD-10-CM

## 2023-01-15 DIAGNOSIS — W1789XA Other fall from one level to another, initial encounter: Secondary | ICD-10-CM | POA: Diagnosis not present

## 2023-01-15 DIAGNOSIS — S42109A Fracture of unspecified part of scapula, unspecified shoulder, initial encounter for closed fracture: Secondary | ICD-10-CM | POA: Diagnosis not present

## 2023-01-15 DIAGNOSIS — W19XXXA Unspecified fall, initial encounter: Secondary | ICD-10-CM | POA: Diagnosis not present

## 2023-01-15 DIAGNOSIS — S2249XA Multiple fractures of ribs, unspecified side, initial encounter for closed fracture: Secondary | ICD-10-CM | POA: Diagnosis not present

## 2023-01-15 LAB — GLUCOSE, CAPILLARY
Glucose-Capillary: 95 mg/dL (ref 70–99)
Glucose-Capillary: 108 mg/dL — ABNORMAL HIGH (ref 70–99)
Glucose-Capillary: 122 mg/dL — ABNORMAL HIGH (ref 70–99)
Glucose-Capillary: 100 mg/dL — ABNORMAL HIGH (ref 70–99)
Glucose-Capillary: 108 mg/dL — ABNORMAL HIGH (ref 70–99)

## 2023-01-15 NOTE — Progress Notes (Addendum)
Progress Note     Subjective: Resting comfortably. No acute distress.    Objective: Vital signs in last 24 hours: Temp:  [97.8 F (36.6 C)-98.3 F (36.8 C)] 97.8 F (36.6 C) (06/28 0734) Pulse Rate:  [70-85] 76 (06/28 0734) Resp:  [16-19] 16 (06/28 0734) BP: (148-178)/(57-67) 148/57 (06/28 0734) SpO2:  [95 %-98 %] 97 % (06/28 0734) Weight:  [67.3 kg] 67.3 kg (06/28 0417) Last BM Date : 01/13/23  Intake/Output from previous day: 06/27 0701 - 06/28 0700 In: 40 [P.O.:40] Out: 1000 [Urine:1000] Intake/Output this shift: No intake/output data recorded.  PE: Gen: comfortable, no distress Neuro: oriented to self only HEENT: PERRL Neck: contusion over R neck. CV: RRR Pulm: right chest wall hematoma stable Abd: soft, NT, nondistended. Extr: wwp, no edema   Lab Results:  No results for input(s): "WBC", "HGB", "HCT", "PLT" in the last 72 hours.  BMET Recent Labs    01/14/23 0905  NA 131*  K 4.3  CL 96*  CO2 20*  GLUCOSE 152*  BUN 18  CREATININE 0.69  CALCIUM 8.3*   PT/INR No results for input(s): "LABPROT", "INR" in the last 72 hours. CMP     Component Value Date/Time   NA 131 (L) 01/14/2023 0905   K 4.3 01/14/2023 0905   CL 96 (L) 01/14/2023 0905   CO2 20 (L) 01/14/2023 0905   GLUCOSE 152 (H) 01/14/2023 0905   BUN 18 01/14/2023 0905   CREATININE 0.69 01/14/2023 0905   CALCIUM 8.3 (L) 01/14/2023 0905   PROT 4.9 (L) 01/04/2023 1412   ALBUMIN 3.2 (L) 01/04/2023 1412   AST 69 (H) 01/04/2023 1412   ALT 40 01/04/2023 1412   ALKPHOS 52 01/04/2023 1412   BILITOT 2.4 (H) 01/04/2023 1412   GFRNONAA >60 01/14/2023 0905   Lipase  No results found for: "LIPASE"     Studies/Results: No results found.  Anti-infectives: Anti-infectives (From admission, onward)    Start     Dose/Rate Route Frequency Ordered Stop   01/04/23 1530  ceFAZolin (ANCEF) IVPB 2g/100 mL premix        2 g 200 mL/hr over 30 Minutes Intravenous  Once 01/04/23 1526 01/04/23 1634         Assessment/Plan  Found down in an empty swimming pool    R scalp laceration - closed in ED with 9 staples on 6/17; removed staples 6/27 T11 fracture, multiple thoracic and lumbar spine transverse process fractures - per NS c/s, mobilize in brace SAH/SDH - NS c/s Dr. Yetta Barre, repeat CT stable 6/18, IV keppra x 7 days; Neuro consult to rule out other sources of AMS. EEG without epileptiform changes. MRI stable with bilateral subdural hematoma. Neuro signed off.  R chest wall hematoma - stable R clavicle fracture - ortho c/s, sling, NWB L scapula fracture - ortho c/s, WBAT R HTX, R Fib FX 5-6, small PTX- Multimodal pain control, IS, follow up CXR 6/18 without PTX, Follow up CT without increase in HTX and small (<5%) PTX ABL anemia - s/p 1 u RBC 6/17, hgb 8.4, stable  C-collar - significant motion on CT, but no fx, unable to participate in clinical exam, likely not going to sit still for MRI if unable to sit still enough for CT. Flex ex performed 6/18 and c collar was removed.   FEN - Reg diet/nectar thick as tolerated, Cortrak removed 6/27 VTE - SCD's, LMWH ID - Tdap given in ED Dispo - 6N,  monitoring appetite over the weekend, possible transition  to hospice care/full comfort in 24-48h if his appetite does not improve. Appreciate palliative care team.    LOS: 11 days   I reviewed last 24 h vitals and pain scores, last 48 h intake and output, and last 24 h labs and trends.    Adam Phenix, [facility] Surgery 01/15/2023, 8:57 AM Please see Amion for pager number during day hours 7:00am-4:30pm

## 2023-01-15 NOTE — Progress Notes (Addendum)
Daily Progress Note   Patient Name: Omar Kennedy       Date: 01/15/2023 DOB: 11/08/1945  Age: 77 y.o. MRN#: [id number] Attending Physician: Roslynn Amble, MD Primary Care Physician: [facility] Admit Date: 01/04/2023  Reason for Consultation/Follow-up: Establishing goals of care  Subjective: I have reviewed medical records including EPIC notes, MAR, and labs. Patient's last BM was 6/26 per chart. Received report from primary RN - no acute concerns. RN reports patient continues to be confused and refuses most oral intake - he took one sip of water. RN states patient was able to take medications crushed with applesauce requiring lots of encouragement.   Went to visit patient at bedside - no family/visiors present. Patient was lying in bed asleep - I did not attempt to wake him to preserve comfort. No signs or non-verbal gestures of pain or discomfort noted. No respiratory distress, increased work of breathing, or secretions noted.   Called wife/[name]. Emotional support provided. Provided updates per my and RN assessment as noted above. [name] is not surprised to hear this. Therapeutic listening provided as she reflects on how her daughter/[name] is having a difficult time with not continuing tube feeds. Prognosis reviewed. Gerarda Gunther is flying in tonight and Brayton Caves will be arriving tomorrow. Offered another family meeting tomorrow 6/29 for decision making regarding next steps pending patient's clinical course - [name] will reach out to her daughters to see what time works best for them. [name] will call PMT back with a time they would like to meet. [name] is also struggling with whether to bring patient home vs residential hospice - she still prefers residential hospice placement but feels guilty not  taking him home. Emotional support provided.   All questions and concerns addressed. Encouraged to call with questions and/or concerns. PMT card previously provided.  Length of Stay: 11  Current Medications: Scheduled Meds:   sodium chloride   Intravenous Once   acetaminophen  1,000 mg Oral Q6H   calcium carbonate  1,250 mg Oral Q breakfast   carvedilol  6.25 mg Oral BID WC   cholecalciferol  1,000 Units Oral Daily   docusate sodium  100 mg Oral BID   enoxaparin (LOVENOX) injection  30 mg Subcutaneous Q12H   escitalopram  5 mg Oral Daily   fluticasone  1 spray Each Nare QPM  lidocaine  2 patch Transdermal Q24H   loratadine  10 mg Oral Daily   multivitamin with minerals  1 tablet Oral Daily   mouth rinse  15 mL Mouth Rinse 4 times per day   polyethylene glycol  17 g Per Tube BID   QUEtiapine  25 mg Oral QHS   rivastigmine  3 mg Oral BID   rosuvastatin  40 mg Oral QHS   thiamine  100 mg Per Tube Daily   vitamin B-12  100 mcg Oral Daily    Continuous Infusions:   PRN Meds: haloperidol lactate, hydrALAZINE, metoprolol tartrate, ondansetron **OR** ondansetron (ZOFRAN) IV, mouth rinse, oxyCODONE  Physical Exam Vitals and nursing note reviewed.  Constitutional:      General: He is not in acute distress.    Appearance: He is ill-appearing.  Pulmonary:     Effort: No respiratory distress.  Skin:    General: Skin is warm and dry.  Neurological:     Motor: Weakness present.     Comments: asleep             Vital Signs: BP (!) 148/57 (BP Location: Right Arm)   Pulse 76   Temp 97.8 F (36.6 C)   Resp 16   Ht 5\' 10"  (1.778 m)   Wt 67.3 kg   SpO2 97%   BMI 21.29 kg/m  SpO2: SpO2: 97 % O2 Device: O2 Device: Room Air O2 Flow Rate: O2 Flow Rate (L/min): 2 L/min  Intake/output summary:  Intake/Output Summary (Last 24 hours) at 01/15/2023 1014 Last data filed at 01/15/2023 0411 Gross per 24 hour  Intake 40 ml  Output 1000 ml  Net -960 ml   LBM: Last BM Date :  01/13/23 Baseline Weight: Weight: 77.1 kg Most recent weight: Weight: 67.3 kg       Palliative Assessment/Data: PPS 10-20%      Patient Active Problem List   Diagnosis Date Noted   Fall from height of greater than 3 feet 01/04/2023    Palliative Care Assessment & Plan   Patient Profile: 77 y.o. male  with past medical history of Alzheimers and vascular dementia presented to the ED from home after being found down in an empty swimming pool. Patient was admitted on 01/04/2023 with right scalp fracture, multiple thoracic and lumbar spine transverse process fractures, SAH/SDH, right chest wall hematoma, right clavical fracture, left scapula fracture. Coretrak was place on 01/11/23 due to severe malnutrition and poor oral intake.   Assessment: Principal Problem:   Fall from height of greater than 3 feet   Concern about end of life    Decreased oral intake  Recommendations/Plan: Continue another 24 hours of watchful waiting, for a total of 48 hours. If patient's appetite does not improve by Saturday 6/29, family will be open for his transition to full comfort care and residential hospice referral at that time, requesting [facility]  Wife to call PMT with time family can meet tomorrow 6/29 to discuss final decisions pending patient's clinical course No PEG Continue DNR/DNI as previously documented Continue scheduled tylenol and PRN oxycodone for pain PMT will continue to follow and support holistically  Goals of Care and Additional Recommendations: Limitations on Scope of Treatment: Full Scope Treatment, No Artificial Feeding, and No Tracheostomy  Code Status:    Code Status Orders  (From admission, onward)           Start     Ordered   01/08/23 1625  Do not attempt resuscitation (DNR)  Continuous       Question Answer Comment  If patient has no pulse and is not breathing Do Not Attempt Resuscitation   If patient has a pulse and/or is breathing: Medical Treatment Goals  LIMITED ADDITIONAL INTERVENTIONS: Use medication/IV fluids and cardiac monitoring as indicated; Do not use intubation or mechanical ventilation (DNI), also provide comfort medications.  Transfer to Progressive/Stepdown as indicated, avoid Intensive Care.   Consent: Discussion documented in EHR or advanced directives reviewed      01/08/23 1625           Code Status History     Date Active Date Inactive Code Status Order ID Comments User Context   01/04/2023 1527 01/08/2023 1625 Full Code [id number]  Adam Phenix, PA-C ED       Prognosis:  < 2 weeks if continues with minimal oral intake  Discharge Planning: To Be Determined, likely [facility] plan was discussed with primary RN, patient's wife  Thank you for allowing the Palliative Medicine Team to assist in the care of this patient.   Haskel Khan, NP  Please contact Palliative Medicine Team phone at [phone] for questions and concerns.   *Portions of this note are a verbal dictation therefore any spelling and/or grammatical errors are due to the "[employer]agon [employer] One" system interpretation.

## 2023-01-15 NOTE — Progress Notes (Signed)
Physical Therapy Treatment Patient Details Name: Omar Kennedy MRN: [id number] DOB: 12/25/1945 Today's Date: 01/15/2023   History of Present Illness Pt is 77 yo male who presents on 01/04/23 after falling into empty pool. Sustained R scalp laceration, T11 fx, multiple T/L TP fxs, SAH/ SDH, R chest wall hematoma, R clavicle fx (NWB, sling), L scapula fx (WBAT), R rib fxs 5-6, R HTX. PMH: dementia    PT Comments    Pt was seen for bracing and bedside mobility, became upset and anxious about plna to try to move to stand or scoot on bed or even try a small push up.  Pt is progressing toward a more limited movement tolerance, and m ay do better if intake can be increased as well as working on managing TBI with reduction of stimulus in room but more attempts to move.  Follow acutely for goals of PT and will remain in contact with tx team to see what can be done for pt to increase his success.   Recommendations for follow up therapy are one component of a multi-disciplinary discharge planning process, led by the attending physician.  Recommendations may be updated based on patient status, additional functional criteria and insurance authorization.  Follow Up Recommendations  Can patient physically be transported by private vehicle: No    Assistance Recommended at Discharge Frequent or constant Supervision/Assistance  Patient can return home with the following A lot of help with walking and/or transfers;A lot of help with bathing/dressing/bathroom;Assistance with cooking/housework;Assistance with feeding;Direct supervision/assist for medications management;Direct supervision/assist for financial management;Assist for transportation;Help with stairs or ramp for entrance   Equipment Recommendations   (TBD)    Recommendations for Other Services OT consult     Precautions / Restrictions Precautions Precautions: Back;Fall Required Braces or Orthoses: Sling;Spinal Brace Spinal Brace: Lumbar  corset;Applied in sitting position Restrictions RUE Weight Bearing: Non weight bearing LUE Weight Bearing: Weight bearing as tolerated     Mobility  Bed Mobility Overal bed mobility: Needs Assistance Bed Mobility: Supine to Sit, Sit to Supine Rolling: Mod assist Sidelying to sit: Max assist     Sit to sidelying: Total assist      Transfers Overall transfer level: Needs assistance                 General transfer comment: pt is getting up to side of bed and despite attempting to try to stand or even do sitting push up pt refused to try    Ambulation/Gait               General Gait Details: declines   Stairs             Wheelchair Mobility    Modified Rankin (Stroke Patients Only)       Balance                                            Cognition Arousal/Alertness: Lethargic Behavior During Therapy: Flat affect, Restless, Anxious Overall Cognitive Status: History of cognitive impairments - at baseline                                 General Comments: 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, poor retention of safety information        Exercises      General Comments General comments (  skin integrity, edema, etc.): Pt is up to side of bed with brace and sling, very uncomfortable and unable to take in the infomration about progression, not eating      Pertinent Vitals/Pain Pain Assessment Pain Assessment: Faces Faces Pain Scale: Hurts whole lot Pain Location: generally L hip and  back as well as R shoulder Pain Descriptors / Indicators: Guarding, Grimacing, Aching    Home Living                          Prior Function            PT Goals (current goals can now be found in the care plan section) Progress towards PT goals: Not progressing toward goals - comment    Frequency    Min 3X/week      PT Plan Current plan remains appropriate    Co-evaluation              AM-PAC PT "6 Clicks"  Mobility   Outcome Measure  Help needed turning from your back to your side while in a flat bed without using bedrails?: Total Help needed moving from lying on your back to sitting on the side of a flat bed without using bedrails?: Total Help needed moving to and from a bed to a chair (including a wheelchair)?: Total Help needed standing up from a chair using your arms (e.g., wheelchair or bedside chair)?: Total Help needed to walk in hospital room?: Total Help needed climbing 3-5 steps with a railing? : Total 6 Click Score: 6    End of Session Equipment Utilized During Treatment: Other (comment) Activity Tolerance: Treatment limited secondary to medical complications (Comment) Patient left: in bed;with call bell/phone within reach;with bed alarm set Nurse Communication: Mobility status PT Visit Diagnosis: Pain;History of falling (Z91.81);Difficulty in walking, not elsewhere classified (R26.2) Pain - Right/Left: Right Pain - part of body: Shoulder (back and L hip)     Time: 1050-1110 PT Time Calculation (min) (ACUTE ONLY): 20 min  Charges:  $Therapeutic Activity: 8-22 mins                   Ivar Drape 01/15/2023, 5:45 PM  Samul Dada, PT PhD Acute Rehab Dept. Number: [facility] [id number] and [facility] [phone]

## 2023-01-15 NOTE — TOC Progression Note (Signed)
Transition of Care [facility]) - Progression Note    Patient Details  Name: Omar Kennedy MRN: [id number] Date of Birth: Oct 02, 1945  [facility] CM/SW Contact  Astrid Drafts Berna Spare, RN Phone Number: 01/15/2023, 4:24 PM  Clinical Narrative:    Palliative Medicine Team spoke with wife this morning; they plan to meet with additional family members tomorrow to discuss final decisions for patient's clinical course. Patient to remain DNR/DNI; family may consider transition to full comfort care and possible residential hospice referral if patient's appetite does not improve.  TOC Case Manager will continue to follow progress.  Please consult for any needs.    Expected Discharge Plan: Skilled Nursing Facility Barriers to Discharge: English as a second language teacher, Other (must enter comment) (snf choice)  Expected Discharge Plan and Services   Discharge Planning Services: CM Consult   Living arrangements for the past 2 months: Single Family Home                                       Social Determinants of Health (SDOH) Interventions    Readmission Risk Interventions     No data to display         Quintella Baton, RN, BSN  Trauma/Neuro ICU Case Manager [phone]

## 2023-01-15 NOTE — Progress Notes (Signed)
Occupational Therapy Treatment Patient Details Name: Omar Kennedy MRN: [id number] DOB: 1946/01/25 Today's Date: 01/15/2023   History of present illness Pt is 77 yo male who presents on 01/04/23 after falling into empty pool. Sustained R scalp laceration, T11 fx, multiple T/L TP fxs, SAH/ SDH, R chest wall hematoma, R clavicle fx (NWB, sling), L scapula fx (WBAT), R rib fxs 5-6, R HTX. PMH: dementia   OT comments  Pt. Seen for skilled OT treatment session.  Attempts for self feeding with beverage and bite of food.  Pt. Moving head away and closing lips continuing to say " no please, no please thank you".  Offered different options that were available on his tray and he cont. To politely decline. Attempted bed level grooming task.  Required max hand over hand assistance to bring wash cloth to face.  When therapist asst. Removed support pt. Did not continue the task.  Note possible poc/d/c changes pending weekend progress with po intake.    Recommendations for follow up therapy are one component of a multi-disciplinary discharge planning process, led by the attending physician.  Recommendations may be updated based on patient status, additional functional criteria and insurance authorization.    Assistance Recommended at Discharge Frequent or constant Supervision/Assistance  Patient can return home with the following  Two people to help with walking and/or transfers;Assistance with cooking/housework;Direct supervision/assist for financial management;Direct supervision/assist for medications management;Assistance with feeding;Assist for transportation;Help with stairs or ramp for entrance;Two people to help with bathing/dressing/bathroom   Equipment Recommendations       Recommendations for Other Services      Precautions / Restrictions Precautions Precautions: Back;Fall Required Braces or Orthoses: Sling;Spinal Brace Spinal Brace: Lumbar corset;Applied in sitting position Restrictions RUE  Weight Bearing: Non weight bearing LUE Weight Bearing: Weight bearing as tolerated       Mobility Bed Mobility                    Transfers                         Balance                                           ADL either performed or assessed with clinical judgement   ADL Overall ADL's : Needs assistance/impaired Eating/Feeding: Bed level Eating/Feeding Details (indicate cue type and reason): attempted sip of juice, pt. pushed lips together and liquid spilled from lips. pt. stating over and over "no please".  offered milk instead and pt. states "no please no, thank you".  attempted a bite of potato pt. closed lips and pushed the potato away stating "no please". Grooming: Bed level;Maximal assistance;Wash/dry face Grooming Details (indicate cue type and reason): pt. states "yes please go ahead" washcloth placed in patients hand and encouraged hand over hand guidance to wash face.  when assistance removed pt. did not continue task on his own                               General ADL Comments: pt. declining beverage and food attempts.  eyes closed but answering some questions.  max hand over hand a for attempts at grooming task.  repositioned in bed for comfort and better positioning for feeding attempts.  pt. with head turned to  left and would not tolerate attempts and turning head/neck towards center, more neutral positioning    Extremity/Trunk Assessment              Vision       Perception     Praxis      Cognition Arousal/Alertness: Lethargic                                              Exercises      Shoulder Instructions       General Comments      Pertinent Vitals/ Pain       Pain Assessment Pain Assessment: Faces Faces Pain Scale: Hurts even more Pain Location: moaning but not verbalizing or able to indicate where the pain is Pain Descriptors / Indicators: Moaning, Grimacing Pain  Intervention(s): Limited activity within patient's tolerance, Repositioned, Monitored during session  Home Living                                          Prior Functioning/Environment              Frequency           Progress Toward Goals  OT Goals(current goals can now be found in the care plan section)        Plan Discharge plan remains appropriate;Frequency remains appropriate    Co-evaluation                 AM-PAC OT "6 Clicks" Daily Activity     Outcome Measure   Help from another person eating meals?: A Lot Help from another person taking care of personal grooming?: A Lot Help from another person toileting, which includes using toliet, bedpan, or urinal?: Total Help from another person bathing (including washing, rinsing, drying)?: Total Help from another person to put on and taking off regular upper body clothing?: Total Help from another person to put on and taking off regular lower body clothing?: Total 6 Click Score: 8    End of Session    OT Visit Diagnosis: Muscle weakness (generalized) (M62.81);Pain;History of falling (Z91.81)   Activity Tolerance Patient limited by lethargy   Patient Left in bed;with call bell/phone within reach;with bed alarm set   Nurse Communication Other (comment) (rn states ok to attempt skilled therapy session with pt. today)        Time: 4098-1191 OT Time Calculation (min): 10 min  Charges: OT General Charges $OT Visit: 1 Visit OT Treatments $Self Care/Home Management : 8-22 mins  Boneta Lucks, COTA/L Acute Rehabilitation [phone]   Alessandra Bevels Lorraine-COTA/L 01/15/2023, 10:30 AM

## 2023-01-16 DIAGNOSIS — T1490XA Injury, unspecified, initial encounter: Secondary | ICD-10-CM | POA: Diagnosis not present

## 2023-01-16 DIAGNOSIS — S2249XA Multiple fractures of ribs, unspecified side, initial encounter for closed fracture: Secondary | ICD-10-CM | POA: Diagnosis not present

## 2023-01-16 DIAGNOSIS — W19XXXA Unspecified fall, initial encounter: Secondary | ICD-10-CM | POA: Diagnosis not present

## 2023-01-16 DIAGNOSIS — W1789XA Other fall from one level to another, initial encounter: Secondary | ICD-10-CM | POA: Diagnosis not present

## 2023-01-16 LAB — GLUCOSE, CAPILLARY
Glucose-Capillary: 105 mg/dL — ABNORMAL HIGH (ref 70–99)
Glucose-Capillary: 109 mg/dL — ABNORMAL HIGH (ref 70–99)
Glucose-Capillary: 117 mg/dL — ABNORMAL HIGH (ref 70–99)
Glucose-Capillary: 145 mg/dL — ABNORMAL HIGH (ref 70–99)
Glucose-Capillary: 99 mg/dL (ref 70–99)

## 2023-01-16 MED ORDER — DEXAMETHASONE 4 MG PO TABS: 4.0000 mg | ORAL_TABLET | Freq: Every day | ORAL | Status: DC

## 2023-01-16 MED ORDER — KETOROLAC TROMETHAMINE 15 MG/ML IJ SOLN
7.5000 mg | Freq: Three times a day (TID) | INTRAMUSCULAR | Status: AC
  Administered 2023-01-16 – 2023-01-19 (×9): 7.5 mg via INTRAVENOUS
  Filled 2023-01-16 (×9): qty 1

## 2023-01-16 MED ORDER — ONDANSETRON 4 MG PO TBDP
4.0000 mg | ORAL_TABLET | Freq: Three times a day (TID) | ORAL | Status: DC
  Administered 2023-01-17 – 2023-01-22 (×15): 4 mg via ORAL
  Filled 2023-01-16 (×17): qty 1

## 2023-01-16 MED ORDER — DEXAMETHASONE 4 MG PO TABS
4.0000 mg | ORAL_TABLET | Freq: Every day | ORAL | Status: DC
  Filled 2023-01-16: qty 1

## 2023-01-16 MED ORDER — DEXAMETHASONE 4 MG PO TABS
4.0000 mg | ORAL_TABLET | Freq: Every day | ORAL | Status: DC
  Administered 2023-01-17 – 2023-01-22 (×6): 4 mg via ORAL
  Filled 2023-01-16 (×7): qty 1

## 2023-01-16 NOTE — Progress Notes (Signed)
Daily Progress Note   Patient Name: Omar Kennedy       Date: 01/16/2023 DOB: [birth year]  Age: 77 y.o. MRN#: [id number] Attending Physician: Roslynn Amble, MD Primary Care Physician: [facility] Admit Date: 01/04/2023  Reason for Consultation/Follow-up: Establishing goals of care  Subjective: I have reviewed medical records including EPIC notes, MAR, and labs. Received report from primary RN - no acute concerns. RN reports patient continues with minimal oral intake - only sips of water and bites of applesauce with meds.  Went to visit patient at bedside - no family/visitors present. Patient was lying in bed awake, alert, confused, able to participate in simple conversation. No signs or non-verbal gestures of pain or discomfort noted. No respiratory distress, increased work of breathing, or secretions noted. RN at bedside providing morning medications - he requires lots of encouragement.  Called wife/[name] - emotional support provided. She was able to visit with patient last night - he only accpepted several bites of food at that time from family. Patient told [name] "I'm sorry, I love you, but I'm ready to die." Daughter/[name] is flying into [place] today - family meeting scheduled for 3:30p to discuss next steps. Family wish to bring patient some of his favorite foods to see how he does prior to meeting with PMT.   3:40 PM ***  All questions and concerns addressed. Encouraged to call with questions and/or concerns. PMT card provided.  Length of Stay: 12  Current Medications: Scheduled Meds:   sodium chloride   Intravenous Once   acetaminophen  1,000 mg Oral Q6H   calcium carbonate  1,250 mg Oral Q breakfast   carvedilol  6.25 mg Oral BID WC   cholecalciferol  1,000 Units Oral Daily    docusate sodium  100 mg Oral BID   enoxaparin (LOVENOX) injection  30 mg Subcutaneous Q12H   escitalopram  5 mg Oral Daily   fluticasone  1 spray Each Nare QPM   lidocaine  2 patch Transdermal Q24H   loratadine  10 mg Oral Daily   multivitamin with minerals  1 tablet Oral Daily   mouth rinse  15 mL Mouth Rinse 4 times per day   polyethylene glycol  17 g Per Tube BID   QUEtiapine  25 mg Oral QHS   rivastigmine  3 mg Oral BID  rosuvastatin  40 mg Oral QHS   vitamin B-12  100 mcg Oral Daily    Continuous Infusions:   PRN Meds: haloperidol lactate, hydrALAZINE, metoprolol tartrate, ondansetron **OR** ondansetron (ZOFRAN) IV, mouth rinse, oxyCODONE  Physical Exam          Vital Signs: BP (!) 167/69 (BP Location: Left Arm)   Pulse 76   Temp 97.9 F (36.6 C)   Resp 16   Ht 5\' 10"  (1.778 m)   Wt 67.3 kg   SpO2 97%   BMI 21.29 kg/m  SpO2: SpO2: 97 % O2 Device: O2 Device: Room Air O2 Flow Rate: O2 Flow Rate (L/min): 2 L/min  Intake/output summary:  Intake/Output Summary (Last 24 hours) at 01/16/2023 1030 Last data filed at 01/15/2023 2011 Gross per 24 hour  Intake --  Output 1400 ml  Net -1400 ml   LBM: Last BM Date : 01/13/23 Baseline Weight: Weight: 77.1 kg Most recent weight: Weight: 67.3 kg       Palliative Assessment/Data:      Patient Active Problem List   Diagnosis Date Noted   Fall from height of greater than 3 feet 01/04/2023    Palliative Care Assessment & Plan   Patient Profile: ***  Assessment: Principal Problem:   Fall from height of greater than 3 feet   Recommendations/Plan: Continue DNR/DNI as previously documented Starting 24-48 hour timed trial of dexamethasone, Toradol, and scheduled zofran. Family were not ready to make final decisions for comfort/hospice today - they wish to start an appetite stimulant as well as scheduled pain and antiemetic Continue scheduled tylenol, PRN oxycodone, and lidocaine patch for pain Family plan to  tour [facility] tonight Follow up family meeting scheduled for tomorrow 6/30 at 3:30p No PEG PMT will continue to follow and support holistically  Goals of Care and Additional Recommendations: Limitations on Scope of Treatment: Full Scope Treatment, No Artificial Feeding, and No Tracheostomy  Code Status:    Code Status Orders  (From admission, onward)           Start     Ordered   01/08/23 1625  Do not attempt resuscitation (DNR)  Continuous       Question Answer Comment  If patient has no pulse and is not breathing Do Not Attempt Resuscitation   If patient has a pulse and/or is breathing: Medical Treatment Goals LIMITED ADDITIONAL INTERVENTIONS: Use medication/IV fluids and cardiac monitoring as indicated; Do not use intubation or mechanical ventilation (DNI), also provide comfort medications.  Transfer to Progressive/Stepdown as indicated, avoid Intensive Care.   Consent: Discussion documented in EHR or advanced directives reviewed      01/08/23 1625           Code Status History     Date Active Date Inactive Code Status Order ID Comments User Context   01/04/2023 1527 01/08/2023 1625 Full Code [id number]  Adam Phenix, PA-C ED       Prognosis:  < 2 weeks if continues with poor oral intake  Discharge Planning: To Be Determined  Care plan was discussed with primary RN, patient's family, attending, hospice liaison  Thank you for allowing the Palliative Medicine Team to assist in the care of this patient.   Total Time 120 minutes Prolonged Time Billed  yes       Greater than 50%  of this time was spent counseling and coordinating care related to the above assessment and plan.  Haskel Khan, NP  Please contact Palliative Medicine  Team phone at [phone] for questions and concerns.   *Portions of this note are a verbal dictation therefore any spelling and/or grammatical errors are due to the "Dragon Medical One" system interpretation.

## 2023-01-16 NOTE — Progress Notes (Signed)
Brief Palliative Medicine Progress Note:  Follow up GOC meeting held with patient's wife and two daughters. Full note to follow:  Recommendations/Plan: Continue DNR/DNI as previously documented Starting 24-48 hour timed trial of dexamethasone, Toradol, and scheduled zofran. Family were not ready to make final decisions for comfort/hospice today - they wish to start an appetite stimulant as well as scheduled pain and antiemetic Continue scheduled tylenol, PRN oxycodone, and lidocaine patch for pain Family plan to tour [facility] tonight Follow up family meeting scheduled for tomorrow 6/30 at 3:30p No PEG PMT will continue to follow and support holistically  Maksym Pfiffner M. Katrinka Blazing [facility] Palliative Medicine Team Team Phone: [phone] NO CHARGE

## 2023-01-16 NOTE — Progress Notes (Signed)
Progress Note     Subjective: Resting comfortably. No acute distress.    Objective: Vital signs in last 24 hours: Temp:  [97.4 F (36.3 C)-97.9 F (36.6 C)] 97.9 F (36.6 C) (06/29 0720) Pulse Rate:  [68-76] 76 (06/29 0720) Resp:  [16-17] 16 (06/29 0720) BP: (154-167)/(64-69) 167/69 (06/29 0720) SpO2:  [97 %-99 %] 97 % (06/29 0720) Last BM Date : 01/13/23  Intake/Output from previous day: 06/28 0701 - 06/29 0700 In: -  Out: 1400 [Urine:1400] Intake/Output this shift: No intake/output data recorded.  PE: Gen: comfortable, no distress Neuro: oriented to self only HEENT: PERRL Neck: contusion over R neck. CV: RRR Pulm: right chest wall hematoma stable Abd: soft, NT, nondistended. Extr: wwp, no edema   Lab Results:  No results for input(s): "WBC", "HGB", "HCT", "PLT" in the last 72 hours.  BMET Recent Labs    01/14/23 0905  NA 131*  K 4.3  CL 96*  CO2 20*  GLUCOSE 152*  BUN 18  CREATININE 0.69  CALCIUM 8.3*   PT/INR No results for input(s): "LABPROT", "INR" in the last 72 hours. CMP     Component Value Date/Time   NA 131 (L) 01/14/2023 0905   K 4.3 01/14/2023 0905   CL 96 (L) 01/14/2023 0905   CO2 20 (L) 01/14/2023 0905   GLUCOSE 152 (H) 01/14/2023 0905   BUN 18 01/14/2023 0905   CREATININE 0.69 01/14/2023 0905   CALCIUM 8.3 (L) 01/14/2023 0905   PROT 4.9 (L) 01/04/2023 1412   ALBUMIN 3.2 (L) 01/04/2023 1412   AST 69 (H) 01/04/2023 1412   ALT 40 01/04/2023 1412   ALKPHOS 52 01/04/2023 1412   BILITOT 2.4 (H) 01/04/2023 1412   GFRNONAA >60 01/14/2023 0905   Lipase  No results found for: "LIPASE"     Studies/Results: No results found.  Anti-infectives: Anti-infectives (From admission, onward)    Start     Dose/Rate Route Frequency Ordered Stop   01/04/23 1530  ceFAZolin (ANCEF) IVPB 2g/100 mL premix        2 g 200 mL/hr over 30 Minutes Intravenous  Once 01/04/23 1526 01/04/23 1634        Assessment/Plan  Found down in an  empty swimming pool    R scalp laceration - closed in ED with 9 staples on 6/17; removed staples 6/27 T11 fracture, multiple thoracic and lumbar spine transverse process fractures - per NS c/s, mobilize in brace SAH/SDH - NS c/s Dr. Yetta Barre, repeat CT stable 6/18, IV keppra x 7 days; Neuro consult to rule out other sources of AMS. EEG without epileptiform changes. MRI stable with bilateral subdural hematoma. Neuro signed off.  R chest wall hematoma - stable R clavicle fracture - ortho c/s, sling, NWB L scapula fracture - ortho c/s, WBAT R HTX, R Fib FX 5-6, small PTX- Multimodal pain control, IS, follow up CXR 6/18 without PTX, Follow up CT without increase in HTX and small (<5%) PTX ABL anemia - s/p 1 u RBC 6/17, hgb 8.4, stable  C-collar - significant motion on CT, but no fx, unable to participate in clinical exam, likely not going to sit still for MRI if unable to sit still enough for CT. Flex ex performed 6/18 and c collar was removed.   FEN - Reg diet/nectar thick as tolerated, Cortrak removed 6/27 VTE - SCD's, LMWH ID - Tdap given in ED Dispo - 6N,  monitoring appetite over the weekend, possible transition to hospice care/full comfort in 24-48h if his  appetite does not improve. Appreciate palliative care team.    LOS: 12 days   I reviewed last 24 h vitals and pain scores, last 48 h intake and output, and last 24 h labs and trends.    Letha Cape, [facility] Surgery 01/16/2023, 9:52 AM Please see Amion for pager number during day hours 7:00am-4:30pm

## 2023-01-17 DIAGNOSIS — T1490XA Injury, unspecified, initial encounter: Secondary | ICD-10-CM | POA: Diagnosis not present

## 2023-01-17 DIAGNOSIS — W1789XA Other fall from one level to another, initial encounter: Secondary | ICD-10-CM | POA: Diagnosis not present

## 2023-01-17 DIAGNOSIS — W19XXXA Unspecified fall, initial encounter: Secondary | ICD-10-CM | POA: Diagnosis not present

## 2023-01-17 DIAGNOSIS — S2249XA Multiple fractures of ribs, unspecified side, initial encounter for closed fracture: Secondary | ICD-10-CM | POA: Diagnosis not present

## 2023-01-17 LAB — GLUCOSE, CAPILLARY
Glucose-Capillary: 101 mg/dL — ABNORMAL HIGH (ref 70–99)
Glucose-Capillary: 101 mg/dL — ABNORMAL HIGH (ref 70–99)
Glucose-Capillary: 129 mg/dL — ABNORMAL HIGH (ref 70–99)
Glucose-Capillary: 136 mg/dL — ABNORMAL HIGH (ref 70–99)
Glucose-Capillary: 161 mg/dL — ABNORMAL HIGH (ref 70–99)
Glucose-Capillary: 91 mg/dL (ref 70–99)

## 2023-01-17 MED ORDER — SODIUM CHLORIDE 0.9 % IV BOLUS
500.0000 mL | Freq: Once | INTRAVENOUS | Status: AC
  Administered 2023-01-17: 500 mL via INTRAVENOUS

## 2023-01-17 NOTE — Progress Notes (Signed)
Daily Progress Note   Patient Name: Omar Kennedy       Date: 01/17/2023 DOB: Dec 17, 1945  Age: 77 y.o. MRN#: [id number] Attending Physician: Roslynn Amble, MD Primary Care Physician: [facility] Admit Date: 01/04/2023  Reason for Consultation/Follow-up: Establishing goals of care  Subjective: I have reviewed medical records including EPIC notes, MAR, and labs. Received report from primary RN - no acute concerns.  RN reports there are not many changes from yesterday -patient continues with only bites and sips.   Went to visit patient and family at bedside for scheduled meeting.  Patient was lying in bed asleep -he briefly wakes to voice/gentle touch. No signs or non-verbal gestures of pain or discomfort noted. No respiratory distress, increased work of breathing, or secretions noted.   Met wife/[name], daughter/[name], daughter/Jesse in 6N conference room.  Emotional support provided to family.  They were able to tour [facility] last night.  Provided updates per mine and RN assessment as of today.   Continued detailed discussion regarding disposition and next steps: Residential hospice vs home +/- hospice and +/- private duty caregivers vs LTC +/- hospice vs short-term rehab.   The concept of comfort feeds were discussed in detail.  Discussion on the difference between Palliative and Hospice care. Palliative care and hospice have similar goals of managing symptoms, promoting comfort, improving quality of life, and maintaining a person's dignity. However, palliative care may be offered during any phase of a serious illness, while hospice care is usually offered when a person is expected to live for 6 months or less.  Palliative care can transition patient's easily into hospice  care.  Encouraged family to consider patient's quality of life, wishes, goals of care he would make for himself. Wife reiterates patient expressed dissatisfaction with his quality of life prior to this incident. Family indicate that patient "valued independence more than anything."  Therapeutic listening provided as they reflect on memories were patient showed examples of this.  Family also indicate that he has had a significant/progressive decline since December 2023 /January 2024.  Reviewed his values in context of anticipated trajectory of dementia decline and likely placement in memory care/long-term care if he does improve.  Family expressed they are "struggling with knowing if he would rather be dead versus alive without independence."  Encouraged family to keep  patient's thoughts/wishes at the center of decision making.  During meeting neighbor/friend at bedside texted daughter to indicate patient was accepting of half a box of medium [place] fries from [employer] and 5 spoons of persimmon pudding.  Detailed discussion was had regarding symptom management.  Again reviewed medications started yesterday.  Family specifically ask about fentanyl patch, Megace, increasing current medications.  Reviewed each one of these in context of medical decision making to include time for outcomes, sedation, pill burden, time it takes for therapeutic effect, and ability to continue after discharge (PO vs IV) - will continue current regimen at this time and monitor.  Family understand that medication adjustments and assessment can be ongoing.  Family question discharge options where IV medications can be given - educated that only residential hospice facility would be a discharge option for IV medications.   Family question if coretrak can be replaced to allow additional time for outcomes.  Reviewed that at this time patient is medically stable for discharge and that they are at decision point of either pursuing  artificial nutrition/hydration with PEG versus allowing nature to take its course.  Lab work reviewed.  [name] continues to indicate they are not interested in pursuing PEG tube.  She states "he would not want to keep his body alive while his mind is dying."  Family would like IVF bolus to be given in context of his decreased oral intake and see how he is doing tomorrow -explained I would discuss with attending.  Prognosis reviewed as less than 2 weeks if he continues with minimal oral intake.  Meeting scheduled for 2:00pm tomorrow for final decisions on next steps.  Family request that attending be present as they would like to know if patient can remain in-house with coretrak for additional time.  Family expressed appreciation for PMT support. Hard Choices book provided.   All questions and concerns addressed. Encouraged to call with questions and/or concerns. PMT card previously provided.  Notified Barnetta Chapel PA of requests for IVF bolus and attending presence at meeting tomorrow.  Length of Stay: 13  Current Medications: Scheduled Meds:   sodium chloride   Intravenous Once   acetaminophen  1,000 mg Oral Q6H   calcium carbonate  1,250 mg Oral Q breakfast   carvedilol  6.25 mg Oral BID WC   cholecalciferol  1,000 Units Oral Daily   dexamethasone  4 mg Oral Daily   docusate sodium  100 mg Oral BID   enoxaparin (LOVENOX) injection  30 mg Subcutaneous Q12H   escitalopram  5 mg Oral Daily   fluticasone  1 spray Each Nare QPM   ketorolac  7.5 mg Intravenous Q8H   lidocaine  2 patch Transdermal Q24H   loratadine  10 mg Oral Daily   multivitamin with minerals  1 tablet Oral Daily   ondansetron  4 mg Oral TID AC   mouth rinse  15 mL Mouth Rinse 4 times per day   polyethylene glycol  17 g Per Tube BID   QUEtiapine  25 mg Oral QHS   rivastigmine  3 mg Oral BID   rosuvastatin  40 mg Oral QHS   vitamin B-12  100 mcg Oral Daily    Continuous Infusions:   PRN Meds: haloperidol  lactate, hydrALAZINE, metoprolol tartrate, ondansetron **OR** ondansetron (ZOFRAN) IV, mouth rinse, oxyCODONE  Physical Exam Vitals and nursing note reviewed.  Constitutional:      General: He is not in acute distress.    Appearance: He is cachectic. He  is ill-appearing.  Pulmonary:     Effort: No respiratory distress.  Skin:    General: Skin is warm and dry.  Neurological:     Motor: Weakness present.     Comments: asleep             Vital Signs: BP (!) 168/68 (BP Location: Left Arm)   Pulse 74   Temp 97.8 F (36.6 C) (Oral)   Resp 17   Ht 5\' 10"  (1.778 m)   Wt 67.3 kg   SpO2 97%   BMI 21.29 kg/m  SpO2: SpO2: 97 % O2 Device: O2 Device: Room Air O2 Flow Rate: O2 Flow Rate (L/min): 2 L/min  Intake/output summary: No intake or output data in the 24 hours ending 01/17/23 1703 LBM: Last BM Date : 01/16/23 Baseline Weight: Weight: 77.1 kg Most recent weight: Weight: 67.3 kg       Palliative Assessment/Data: PPS 20%      Patient Active Problem List   Diagnosis Date Noted   Fall from height of greater than 3 feet 01/04/2023    Palliative Care Assessment & Plan   Patient Profile: 77 y.o. male with past medical history of Alzheimers and vascular dementia presented to the ED from home after being found down in an empty swimming pool. Patient was admitted on 01/04/2023 with right scalp fracture, multiple thoracic and lumbar spine transverse process fractures, SAH/SDH, right chest wall hematoma, right clavical fracture, left scapula fracture. Coretrak was place on 01/11/23 due to severe malnutrition and poor oral intake.   Assessment: Principal Problem:   Fall from height of greater than 3 feet   Concern about end of life  Recommendations/Plan: Continue DNR/DNI as previously documented Giving another 24 hours (total of 48 hours) of dexamethazone, Toradol, and scheduled zofran to allow time for outcomes Family meeting scheduled tomorrow 7/1 at 2p for final decisions on  next steps. Family have asked that attending also be present Continue scheduled tylenol, PRN oxycodone, and lidocaine patch for pain  No PEG PMT will continue to follow and support holistically  Goals of Care and Additional Recommendations: Limitations on Scope of Treatment: Full Scope Treatment  Code Status:    Code Status Orders  (From admission, onward)           Start     Ordered   01/08/23 1625  Do not attempt resuscitation (DNR)  Continuous       Question Answer Comment  If patient has no pulse and is not breathing Do Not Attempt Resuscitation   If patient has a pulse and/or is breathing: Medical Treatment Goals LIMITED ADDITIONAL INTERVENTIONS: Use medication/IV fluids and cardiac monitoring as indicated; Do not use intubation or mechanical ventilation (DNI), also provide comfort medications.  Transfer to Progressive/Stepdown as indicated, avoid Intensive Care.   Consent: Discussion documented in EHR or advanced directives reviewed      01/08/23 1625           Code Status History     Date Active Date Inactive Code Status Order ID Comments User Context   01/04/2023 1527 01/08/2023 1625 Full Code [id number]  Adam Phenix, PA-C ED       Prognosis:  < 2 weeks if continues with poor oral intake   Discharge Planning: To Be Determined  Care plan was discussed with primary RN, patient's family  Thank you for allowing the Palliative Medicine Team to assist in the care of this patient.   Total Time 90 minutes Prolonged Time Billed  yes       Greater than 50%  of this time was spent counseling and coordinating care related to the above assessment and plan.  Haskel Khan, NP  Please contact Palliative Medicine Team phone at [phone] for questions and concerns.   *Portions of this note are a verbal dictation therefore any spelling and/or grammatical errors are due to the "Dragon Medical One" system interpretation.

## 2023-01-17 NOTE — Progress Notes (Signed)
Progress Note     Subjective: Resting comfortably. No acute distress. Appreciate palliative care assistance.  Discussed case with them via secure chat yesterday.  Patient hasn't eaten anything so far today.   Objective: Vital signs in last 24 hours: Temp:  [97.6 F (36.4 C)-98.3 F (36.8 C)] 97.6 F (36.4 C) (06/30 0758) Pulse Rate:  [76-85] 77 (06/30 0758) Resp:  [16-20] 17 (06/30 0758) BP: (132-174)/(78-86) 171/80 (06/30 0758) SpO2:  [96 %-98 %] 98 % (06/30 0758) Last BM Date : 01/16/23  Intake/Output from previous day: No intake/output data recorded. Intake/Output this shift: No intake/output data recorded.  PE: Gen: comfortable, no distress Neuro: oriented to self only HEENT: PERRL Neck: contusion over R neck. CV: RRR Pulm: right chest wall hematoma stable Abd: soft, NT, nondistended. Extr: wwp, no edema   Lab Results:  No results for input(s): "WBC", "HGB", "HCT", "PLT" in the last 72 hours.  BMET Recent Labs    01/14/23 0905  NA 131*  K 4.3  CL 96*  CO2 20*  GLUCOSE 152*  BUN 18  CREATININE 0.69  CALCIUM 8.3*   PT/INR No results for input(s): "LABPROT", "INR" in the last 72 hours. CMP     Component Value Date/Time   NA 131 (L) 01/14/2023 0905   K 4.3 01/14/2023 0905   CL 96 (L) 01/14/2023 0905   CO2 20 (L) 01/14/2023 0905   GLUCOSE 152 (H) 01/14/2023 0905   BUN 18 01/14/2023 0905   CREATININE 0.69 01/14/2023 0905   CALCIUM 8.3 (L) 01/14/2023 0905   PROT 4.9 (L) 01/04/2023 1412   ALBUMIN 3.2 (L) 01/04/2023 1412   AST 69 (H) 01/04/2023 1412   ALT 40 01/04/2023 1412   ALKPHOS 52 01/04/2023 1412   BILITOT 2.4 (H) 01/04/2023 1412   GFRNONAA >60 01/14/2023 0905   Lipase  No results found for: "LIPASE"     Studies/Results: No results found.  Anti-infectives: Anti-infectives (From admission, onward)    Start     Dose/Rate Route Frequency Ordered Stop   01/04/23 1530  ceFAZolin (ANCEF) IVPB 2g/100 mL premix        2 g 200 mL/hr  over 30 Minutes Intravenous  Once 01/04/23 1526 01/04/23 1634        Assessment/Plan  Found down in an empty swimming pool    R scalp laceration - closed in ED with 9 staples on 6/17; removed staples 6/27 T11 fracture, multiple thoracic and lumbar spine transverse process fractures - per NS c/s, mobilize in brace SAH/SDH - NS c/s Dr. Yetta Barre, repeat CT stable 6/18, IV keppra x 7 days; Neuro consult to rule out other sources of AMS. EEG without epileptiform changes. MRI stable with bilateral subdural hematoma. Neuro signed off.  R chest wall hematoma - stable R clavicle fracture - ortho c/s, sling, NWB L scapula fracture - ortho c/s, WBAT R HTX, R Fib FX 5-6, small PTX- Multimodal pain control, IS, follow up CXR 6/18 without PTX, Follow up CT without increase in HTX and small (<5%) PTX ABL anemia - s/p 1 u RBC 6/17, hgb 8.4, stable  C-collar - significant motion on CT, but no fx, unable to participate in clinical exam, likely not going to sit still for MRI if unable to sit still enough for CT. Flex ex performed 6/18 and c collar was removed.   FEN - Reg diet/nectar thick as tolerated, Cortrak removed 6/27 VTE - SCD's, LMWH ID - Tdap given in ED Dispo - 6N,  monitoring appetite  over the weekend, possible transition to hospice care/full comfort in 24-48h if his appetite does not improve.  Steroids added 6/29.  Appreciate palliative care team.    LOS: 13 days   I reviewed last 24 h vitals and pain scores, last 48 h intake and output, and last 24 h labs and trends.    Letha Cape, [facility] Surgery 01/17/2023, 8:48 AM Please see Amion for pager number during day hours 7:00am-4:30pm

## 2023-01-18 ENCOUNTER — Other Ambulatory Visit (HOSPITAL_COMMUNITY): Payer: Self-pay

## 2023-01-18 DIAGNOSIS — E43 Unspecified severe protein-calorie malnutrition: Secondary | ICD-10-CM | POA: Insufficient documentation

## 2023-01-18 DIAGNOSIS — W1789XA Other fall from one level to another, initial encounter: Secondary | ICD-10-CM | POA: Diagnosis not present

## 2023-01-18 DIAGNOSIS — Z7189 Other specified counseling: Secondary | ICD-10-CM | POA: Diagnosis not present

## 2023-01-18 DIAGNOSIS — S22008A Other fracture of unspecified thoracic vertebra, initial encounter for closed fracture: Secondary | ICD-10-CM

## 2023-01-18 DIAGNOSIS — S069XAA Unspecified intracranial injury with loss of consciousness status unknown, initial encounter: Secondary | ICD-10-CM | POA: Diagnosis not present

## 2023-01-18 DIAGNOSIS — S2249XA Multiple fractures of ribs, unspecified side, initial encounter for closed fracture: Secondary | ICD-10-CM | POA: Diagnosis not present

## 2023-01-18 LAB — GLUCOSE, CAPILLARY
Glucose-Capillary: 101 mg/dL — ABNORMAL HIGH (ref 70–99)
Glucose-Capillary: 101 mg/dL — ABNORMAL HIGH (ref 70–99)
Glucose-Capillary: 102 mg/dL — ABNORMAL HIGH (ref 70–99)
Glucose-Capillary: 110 mg/dL — ABNORMAL HIGH (ref 70–99)
Glucose-Capillary: 113 mg/dL — ABNORMAL HIGH (ref 70–99)
Glucose-Capillary: 149 mg/dL — ABNORMAL HIGH (ref 70–99)
Glucose-Capillary: 98 mg/dL (ref 70–99)

## 2023-01-18 MED ORDER — MEGESTROL ACETATE 400 MG/10ML PO SUSP
400.0000 mg | Freq: Every day | ORAL | Status: DC
  Administered 2023-01-18 – 2023-01-22 (×5): 400 mg via ORAL
  Filled 2023-01-18 (×6): qty 10

## 2023-01-18 MED ORDER — MEGESTROL ACETATE 40 MG PO TABS
40.0000 mg | ORAL_TABLET | Freq: Every day | ORAL | Status: DC
  Filled 2023-01-18: qty 1

## 2023-01-18 NOTE — Progress Notes (Signed)
Patient with poor PO intake. Per order, meds crushed/puree and was given with vanilla pudding this morning. Patient consumed a few spoons of medicine before refusing. Notified provider. Megace ordered, form changed to suspension and awaiting delivery from pharmacy at this time. Will attempt to administer and notify with updates.

## 2023-01-18 NOTE — Progress Notes (Signed)
PT Cancellation Note  Patient Details Name: Omar Kennedy MRN: [id number] DOB: [birth year]   Cancelled Treatment:    Reason Eval/Treat Not Completed: Other (comment) (Per RN, pt family having meeting with palliative currently, requests PT to hold until after meeting. PT will follow up tomorrow if appropriate.))   Gladys Damme 01/18/2023, 2:41 PM

## 2023-01-18 NOTE — Progress Notes (Signed)
Physical Therapy Treatment Patient Details Name: AKILLES GEIB MRN: [id number] DOB: 10/21/1945 Today's Date: 01/18/2023   History of Present Illness Pt is 77 yo male who presents on 01/04/23 after falling into empty pool. Sustained R scalp laceration, T11 fx, multiple T/L TP fxs, SAH/ SDH, R chest wall hematoma, R clavicle fx (NWB, sling), L scapula fx (WBAT), R rib fxs 5-6, R HTX. PMH: dementia    PT Comments  Pt tolerated treatment well today. Pt was able to perform 3 sit to stands with Min A 1 person HHA however required max verbal and tactile cues to try and maintain WB precautions. Despite this pt continued to break all WB precautions. Pt appears to respond better when family is present during session compared to previous sessions. No change in DC/DME recs at this time. PT will continue to follow.    Assistance Recommended at Discharge Frequent or constant Supervision/Assistance  If plan is discharge home, recommend the following:  Can travel by private vehicle    A lot of help with walking and/or transfers;A lot of help with bathing/dressing/bathroom;Assistance with cooking/housework;Assistance with feeding;Direct supervision/assist for medications management;Direct supervision/assist for financial management;Assist for transportation;Help with stairs or ramp for entrance   No  Equipment Recommendations  Other (comment) (Per accepting facility)    Recommendations for Other Services       Precautions / Restrictions Precautions Precautions: Back;Fall Precaution Booklet Issued: No Precaution Comments: pt unable to understand or retain precautions, will need cueing every time Required Braces or Orthoses: Sling;Spinal Brace Spinal Brace: Lumbar corset;Applied in sitting position Restrictions Weight Bearing Restrictions: Yes RUE Weight Bearing: Non weight bearing LUE Weight Bearing: Weight bearing as tolerated     Mobility  Bed Mobility Overal bed mobility: Needs  Assistance Bed Mobility: Supine to Sit, Sit to Supine     Supine to sit: Min assist, HOB elevated Sit to supine: Min guard   General bed mobility comments: Pt requiring constant multimodal cues for sequencing however was able to sit EOB mostly on his own. Requested HHA to come EOB. Pt did not adhere to WB precautions despite Max verbal and tactile cues.    Transfers Overall transfer level: Needs assistance Equipment used: 1 person hand held assist Transfers: Sit to/from Stand Sit to Stand: Min assist           General transfer comment: Pt able to perform 3 sit to stands however continued to WB through RUE.    Ambulation/Gait               General Gait Details: Unable to take steps or perform standing marches.   Stairs             Wheelchair Mobility     Tilt Bed    Modified Rankin (Stroke Patients Only)       Balance Overall balance assessment: Needs assistance Sitting-balance support: Bilateral upper extremity supported, Feet supported Sitting balance-Leahy Scale: Fair Sitting balance - Comments: Able to sit EOB for ~10 minutes   Standing balance support: Single extremity supported, During functional activity Standing balance-Leahy Scale: Poor Standing balance comment: Reliant on therapist to maintain balance. Pt also pressed legs against bed.                            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 Pt perseverating on going to the bathroom however not receptive to safety. Pt constantly breaking WB precautions despite max verbal and tactile cues. Pt seems to be more receptive with family.        Exercises      General Comments General comments (skin integrity, edema, etc.):  VSS on RA      Pertinent Vitals/Pain Pain Assessment Pain Assessment: No/denies pain Pain Intervention(s): RN gave pain meds during session    Home Living                          Prior Function            PT Goals (current goals can now be found in the care plan section) Progress towards PT goals: Progressing toward goals    Frequency    Min 3X/week      PT Plan Current plan remains appropriate    Co-evaluation              AM-PAC PT "6 Clicks" Mobility   Outcome Measure  Help needed turning from your back to your side while in a flat bed without using bedrails?: A Little Help needed moving from lying on your back to sitting on the side of a flat bed without using bedrails?: A Little Help needed moving to and from a bed to a chair (including a wheelchair)?: Total Help needed standing up from a chair using your arms (e.g., wheelchair or bedside chair)?: A Little Help needed to walk in hospital room?: Total Help needed climbing 3-5 steps with a railing? : Total 6 Click Score: 12    End of Session   Activity Tolerance: Patient tolerated treatment well Patient left: in bed;with call bell/phone within reach;with bed alarm set;with family/visitor present Nurse Communication: Mobility status PT Visit Diagnosis: Pain;History of falling (Z91.81);Difficulty in walking, not elsewhere classified (R26.2) Pain - Right/Left: Right Pain - part of body: Shoulder     Time: 1610-9604 PT Time Calculation (min) (ACUTE ONLY): 24 min  Charges:    $Therapeutic Activity: 23-37 mins PT General Charges $$ ACUTE PT VISIT: 1 Visit                     Shela Nevin, PT, DPT Acute Rehab Services [phone]    Gladys Damme 01/18/2023, 4:13 PM

## 2023-01-18 NOTE — Progress Notes (Signed)
Speech Language Pathology Treatment: Dysphagia;Cognitive-Linquistic  Patient Details Name: Omar Kennedy MRN: [id number] DOB: April 28, 1946 Today's Date: 01/18/2023 Time: 4098-1191 SLP Time Calculation (min) (ACUTE ONLY): 9 min  Assessment / Plan / Recommendation Clinical Impression  Pt was calm this morning and cooperative with little encouragement needed. He accepted 2-3 sips Gatorade (trial with straw) and 2 bites pears with therapist. There was no cough or signs of aspiration and able to masticate pears and clear in timely manner.  He could not state wife's name but started to say a name that started with /s/ but recognized when heard from SLP. He was able to tell me his daughter's name "Omar Kennedy". Omar Kennedy has communication/language difficulties due to his dementia as pt's wife and friends tell me his word finding deficits are the same as prior to admission and his difficulty understanding what he hears at times.   SLP called wife and was on speaker phone with daughter to provide an update on session and it seemed as if he was close to his baseline although wife states that his communication is worse now. Pt has has head injury and not being in his own environment with dementia. Discussed/educated ways to facilitate communication/cognition via strategies that pt/wife voiced understanding and that SLP was going to sign off at this time (wife and daughter voiced understanding). Palliative care meeting set for 2:00 this afternoon.   HPI HPI: Omar Kennedy. Omar Kennedy is a 77 y/o M with a PMH dementia who presented to the ED as a level 2 trauma after he was found down in an empty swimming pool behind his house. Sustained SAH/SDH, T11 fx, multiple thoracic and lumbar spine transverse process fractures, right clavicle and left scapula fracture, R HTX, R rib fx 5-6      SLP Plan  All goals met;Discharge SLP treatment due to (comment)      Recommendations for follow up therapy are one component of a  multi-disciplinary discharge planning process, led by the attending physician.  Recommendations may be updated based on patient status, additional functional criteria and insurance authorization.    Recommendations  Diet recommendations: Regular;Thin liquid Liquids provided via: Cup;Straw Medication Administration: Crushed with puree Supervision: Staff to assist with self feeding;Full supervision/cueing for compensatory strategies Compensations: Minimize environmental distractions;Slow rate;Small sips/bites Postural Changes and/or Swallow Maneuvers: Seated upright 90 degrees                  Oral care BID   Frequent or constant Supervision/Assistance Dysphagia, unspecified (R13.10);Cognitive communication deficit (R41.841)     All goals met;Discharge SLP treatment due to (comment)     Royce Macadamia  01/18/2023, 10:29 AM

## 2023-01-18 NOTE — Plan of Care (Signed)
  Problem: Education: Goal: Knowledge of General Education information will improve Description: Including pain rating scale, medication(s)/side effects and non-pharmacologic comfort measures Outcome: Progressing   Problem: Clinical Measurements: Goal: Will remain free from infection Outcome: Progressing Goal: Diagnostic test results will improve Outcome: Progressing   Problem: Activity: Goal: Risk for activity intolerance will decrease Outcome: Progressing   Problem: Elimination: Goal: Will not experience complications related to bowel motility Outcome: Progressing Goal: Will not experience complications related to urinary retention Outcome: Progressing   Problem: Pain Managment: Goal: General experience of comfort will improve Outcome: Progressing   Problem: Skin Integrity: Goal: Risk for impaired skin integrity will decrease Outcome: Progressing

## 2023-01-18 NOTE — Progress Notes (Signed)
Daily Progress Note   Patient Name: Omar Kennedy       Date: 01/18/2023 DOB: 1945-12-08  Age: 77 y.o. MRN#: [id number] Attending Physician: Roslynn Amble, MD Primary Care Physician: [facility] Admit Date: 01/04/2023  Reason for Consultation/Follow-up: Establishing goals of care  Subjective: I have reviewed medical records including EPIC notes, MAR, and labs. Patient assessed at the bedside. He is alert and in no pain or distress. Difficult to understand his communication attempts. I arrived with Dr. Janee Morn for the scheduled family meeting and relocated to [facility] waiting room with patient's wife and two daughters.   Patient's family continue to desire avoidance of PEG placement. Given his somewhat improved oral intake over the past couple of days and advancement to regular diet with thin liquids, family wishes to continue with encouragement of intake and trial of SNF/rehab. They would then consider transition to a hospice facility if his oral intake decreases and/or continues to decline overall at SNF. Reviewed his risk for further decline and recommended outpatient palliative care for ongoing GOC discussions based on patient's clinical course. Family prefers [facility].  Provided with additional copies of Hard Choices booklets to patient's daughters. Questions and concerns addressed. PMT will continue to follow and support.  Length of Stay: 14  Physical Exam Vitals and nursing note reviewed.  Constitutional:      General: He is not in acute distress.    Appearance: He is cachectic. He is ill-appearing.  Pulmonary:     Effort: No respiratory distress.  Skin:    General: Skin is warm and dry.  Neurological:     Mental Status: He is alert. Mental status is at baseline.             Vital Signs: BP (!) 155/74 (BP Location: Right Arm)   Pulse 79   Temp 97.8 F (36.6 C)   Resp 17   Ht 5\' 10"  (1.778 m)   Wt 65.2 kg   SpO2 100%   BMI 20.62 kg/m  SpO2: SpO2: 100 % O2 Device: O2 Device: Room Air O2 Flow Rate: O2 Flow Rate (L/min): 2 L/min       Palliative Assessment/Data: PPS 20-30%   Palliative Care Assessment & Plan   Patient Profile: 77 y.o. male with past medical history of Alzheimers and vascular dementia presented to the  ED from home after being found down in an empty swimming pool. Patient was admitted on 01/04/2023 with right scalp fracture, multiple thoracic and lumbar spine transverse process fractures, SAH/SDH, right chest wall hematoma, right clavical fracture, left scapula fracture. Coretrak was place on 01/11/23 due to severe malnutrition and poor oral intake.   Assessment: Principal Problem:   Fall from height of greater than 3 feet Active Problems:   Protein-calorie malnutrition, severe   Concern about end of life  Recommendations/Plan: Continue DNR/DNI  Continue current care Family has decided to proceed with SNF and outpatient palliative care follow up, transition to hospice facility if he declines further Psychosocial and emotional support provided PMT will continue to follow and support holistically  Prognosis: Guarded  Discharge Planning: Skilled Nursing Facility for rehab with Palliative care service follow-up  Care plan was discussed with primary RN, MD, patient, patient's family   MDM: high   Laquia Rosano Loleta Rose Palliative Medicine Team Team phone # [phone]  Thank you for allowing the Palliative Medicine Team to assist in the care of this patient. Please utilize secure chat with additional questions, if there is no response within 30 minutes please call the above phone number.  Palliative Medicine Team providers are available by phone from 7am to 7pm daily and can be reached through the team cell phone.  Should  this patient require assistance outside of these hours, please call the patient's attending physician.  Portions of this note are a verbal dictation therefore any spelling and/or grammatical errors are due to the "Dragon [employer] One" system interpretation.

## 2023-01-18 NOTE — TOC Progression Note (Signed)
Transition of Care [facility]) - Progression Note    Patient Details  Name: Omar Kennedy MRN: [id number] Date of Birth: [birth year]  Transition of [facility]) CM/SW Contact  Astrid Drafts Berna Spare, RN Phone Number: 01/18/2023, 4:27 PM  Clinical Narrative:  Palliative Medicine Team meeting held this afternoon at 2 PM with attending present. Family has decided to proceed with SNF and outpatient palliative care follow up.  They prefer [facility] or [facility] as first choice. [facility] has declined for admission; spoke with Whitney at [facility], and she states she may have availability this week.  Will follow-up with Whitney in a.m., as she states she may have more information about bed availability.  Will follow with updates.    Expected Discharge Plan: Skilled Nursing Facility Barriers to Discharge: English as a second language teacher, Other (must enter comment) (snf choice)  Expected Discharge Plan and Services   Discharge Planning Services: CM Consult   Living arrangements for the past 2 months: Single Family Home                                       Social Determinants of Health (SDOH) Interventions    Readmission Risk Interventions     No data to display         Quintella Baton, RN, BSN  Trauma/Neuro ICU Case Manager [phone]

## 2023-01-18 NOTE — Progress Notes (Signed)
Progress Note     Subjective: Patient eating some of breakfast at bedside. Talking more but still not oriented and unable to maintain full conversation secondary to confusion. Palliative discussion yesterday noted, family meeting planned for 2 PM today.    Objective: Vital signs in last 24 hours: Temp:  [97.8 F (36.6 C)-98.6 F (37 C)] 97.8 F (36.6 C) (07/01 0857) Pulse Rate:  [73-81] 79 (07/01 0857) Resp:  [17-21] 17 (07/01 0857) BP: (154-168)/(65-75) 155/74 (07/01 0857) SpO2:  [97 %-100 %] 100 % (07/01 0857) Weight:  [65.2 kg] 65.2 kg (07/01 0307) Last BM Date : 01/16/23  Intake/Output from previous day: No intake/output data recorded. Intake/Output this shift: No intake/output data recorded.  PE: Gen: comfortable, no distress Neuro: oriented to self only HEENT: PERRL Neck: contusion over R neck. CV: RRR Pulm: right chest wall hematoma stable Abd: soft, NT, nondistended. Extr: wwp, no edema   Lab Results:  No results for input(s): "WBC", "HGB", "HCT", "PLT" in the last 72 hours.  BMET No results for input(s): "NA", "K", "CL", "CO2", "GLUCOSE", "BUN", "CREATININE", "CALCIUM" in the last 72 hours.  PT/INR No results for input(s): "LABPROT", "INR" in the last 72 hours. CMP     Component Value Date/Time   NA 131 (L) 01/14/2023 0905   K 4.3 01/14/2023 0905   CL 96 (L) 01/14/2023 0905   CO2 20 (L) 01/14/2023 0905   GLUCOSE 152 (H) 01/14/2023 0905   BUN 18 01/14/2023 0905   CREATININE 0.69 01/14/2023 0905   CALCIUM 8.3 (L) 01/14/2023 0905   PROT 4.9 (L) 01/04/2023 1412   ALBUMIN 3.2 (L) 01/04/2023 1412   AST 69 (H) 01/04/2023 1412   ALT 40 01/04/2023 1412   ALKPHOS 52 01/04/2023 1412   BILITOT 2.4 (H) 01/04/2023 1412   GFRNONAA >60 01/14/2023 0905   Lipase  No results found for: "LIPASE"     Studies/Results: No results found.  Anti-infectives: Anti-infectives (From admission, onward)    Start     Dose/Rate Route Frequency Ordered Stop    01/04/23 1530  ceFAZolin (ANCEF) IVPB 2g/100 mL premix        2 g 200 mL/hr over 30 Minutes Intravenous  Once 01/04/23 1526 01/04/23 1634        Assessment/Plan  Found down in an empty swimming pool    R scalp laceration - closed in ED with 9 staples on 6/17; removed staples 6/27 T11 fracture, multiple thoracic and lumbar spine transverse process fractures - per NS c/s, mobilize in brace SAH/SDH - NS c/s Dr. Yetta Barre, repeat CT stable 6/18, IV keppra x 7 days; Neuro consult to rule out other sources of AMS. EEG without epileptiform changes. MRI stable with bilateral subdural hematoma. Neuro signed off.  R chest wall hematoma - stable R clavicle fracture - ortho c/s, sling, NWB L scapula fracture - ortho c/s, WBAT R HTX, R Fib FX 5-6, small PTX- Multimodal pain control, IS, follow up CXR 6/18 without PTX, Follow up CT without increase in HTX and small (<5%) PTX ABL anemia - s/p 1 u RBC 6/17, hgb 8.4 6/21 C-collar - significant motion on CT, but no fx, unable to participate in clinical exam, likely not going to sit still for MRI if unable to sit still enough for CT. Flex ex performed 6/18 and c collar was removed.   FEN - Reg diet/nectar thick as tolerated, Cortrak removed 6/27 VTE - SCD's, LMWH ID - Tdap given in ED Dispo - 6N, family discussion this  afternoon.  Steroids added 6/29.  Appreciate palliative care team.    LOS: 14 days   I reviewed nursing notes, Consultant palliative notes, last 24 h vitals and pain scores, and last 48 h intake and output.    Juliet Rude, [facility] Surgery 01/18/2023, 9:27 AM Please see Amion for pager number during day hours 7:00am-4:30pm

## 2023-01-18 NOTE — Plan of Care (Signed)

## 2023-01-19 LAB — GLUCOSE, CAPILLARY
Glucose-Capillary: 100 mg/dL — ABNORMAL HIGH (ref 70–99)
Glucose-Capillary: 100 mg/dL — ABNORMAL HIGH (ref 70–99)
Glucose-Capillary: 111 mg/dL — ABNORMAL HIGH (ref 70–99)
Glucose-Capillary: 108 mg/dL — ABNORMAL HIGH (ref 70–99)
Glucose-Capillary: 97 mg/dL (ref 70–99)

## 2023-01-19 MED ORDER — ENSURE ENLIVE PO LIQD
237.0000 mL | Freq: Three times a day (TID) | ORAL | Status: DC
  Administered 2023-01-19 – 2023-01-22 (×10): 237 mL via ORAL

## 2023-01-19 NOTE — Progress Notes (Signed)
OT Cancellation Note  Patient Details Name: Omar Kennedy MRN: [id number] DOB: January 05, 1946   Cancelled Treatment:    Reason Eval/Treat Not Completed: Patient declined, no reason specified (Pt refusing therapy at this moment but seems confused, OT will f/u with patient later when patient seems more agreeable to therapy.)  01/19/2023  AB, OTR/L  Acute Rehabilitation Services  Office: [phone]  [name] 01/19/2023, 10:33 AM

## 2023-01-19 NOTE — Progress Notes (Signed)
Progress Note     Subjective: Patient ate some cheese crackers and shasta for me. Oriented to self. Much more alert this AM overall.    Objective: Vital signs in last 24 hours: Temp:  [97.4 F (36.3 C)-97.8 F (36.6 C)] 97.8 F (36.6 C) (07/02 0735) Pulse Rate:  [67-93] 71 (07/02 0735) Resp:  [17-19] 18 (07/02 0735) BP: (141-164)/(60-68) 141/60 (07/02 0735) SpO2:  [95 %-100 %] 100 % (07/02 0735) Last BM Date :  (UTA)  Intake/Output from previous day: 07/01 0701 - 07/02 0700 In: 110 [P.O.:110] Out: -  Intake/Output this shift: No intake/output data recorded.  PE: Gen: comfortable, no distress Neuro: oriented to self only HEENT: PERRL Neck: contusion over R neck. CV: RRR Pulm: right chest wall hematoma stable Abd: soft, NT, nondistended. Extr: wwp, no edema   Lab Results:  No results for input(s): "WBC", "HGB", "HCT", "PLT" in the last 72 hours.  BMET No results for input(s): "NA", "K", "CL", "CO2", "GLUCOSE", "BUN", "CREATININE", "CALCIUM" in the last 72 hours.  PT/INR No results for input(s): "LABPROT", "INR" in the last 72 hours. CMP     Component Value Date/Time   NA 131 (L) 01/14/2023 0905   K 4.3 01/14/2023 0905   CL 96 (L) 01/14/2023 0905   CO2 20 (L) 01/14/2023 0905   GLUCOSE 152 (H) 01/14/2023 0905   BUN 18 01/14/2023 0905   CREATININE 0.69 01/14/2023 0905   CALCIUM 8.3 (L) 01/14/2023 0905   PROT 4.9 (L) 01/04/2023 1412   ALBUMIN 3.2 (L) 01/04/2023 1412   AST 69 (H) 01/04/2023 1412   ALT 40 01/04/2023 1412   ALKPHOS 52 01/04/2023 1412   BILITOT 2.4 (H) 01/04/2023 1412   GFRNONAA >60 01/14/2023 0905   Lipase  No results found for: "LIPASE"     Studies/Results: No results found.  Anti-infectives: Anti-infectives (From admission, onward)    Start     Dose/Rate Route Frequency Ordered Stop   01/04/23 1530  ceFAZolin (ANCEF) IVPB 2g/100 mL premix        2 g 200 mL/hr over 30 Minutes Intravenous  Once 01/04/23 1526 01/04/23 1634         Assessment/Plan  Found down in an empty swimming pool    R scalp laceration - closed in ED with 9 staples on 6/17; removed staples 6/27 T11 fracture, multiple thoracic and lumbar spine transverse process fractures - per NS c/s, mobilize in brace SAH/SDH - NS c/s Dr. Yetta Barre, repeat CT stable 6/18, IV keppra x 7 days; Neuro consult to rule out other sources of AMS. EEG without epileptiform changes. MRI stable with bilateral subdural hematoma. Neuro signed off.  R chest wall hematoma - stable R clavicle fracture - ortho c/s, sling, NWB L scapula fracture - ortho c/s, WBAT R HTX, R Fib FX 5-6, small PTX- Multimodal pain control, IS, follow up CXR 6/18 without PTX, Follow up CT without increase in HTX and small (<5%) PTX ABL anemia - s/p 1 u RBC 6/17, hgb 8.4 6/21 C-collar - significant motion on CT, but no fx, unable to participate in clinical exam, likely not going to sit still for MRI if unable to sit still enough for CT. Flex ex performed 6/18 and c collar was removed.   FEN - Reg diet/nectar thick as tolerated, Cortrak removed 6/27 VTE - SCD's, LMWH ID - Tdap given in ED Dispo - 6N, plan discharge to SNF working on placement currently. Patient medically stable for DC when facility determined and insurance  approves. Updated wife over the phone    LOS: 15 days   I reviewed nursing notes, Consultant palliative notes, last 24 h vitals and pain scores, and last 48 h intake and output.    Juliet Rude, [facility] Surgery 01/19/2023, 11:30 AM Please see Amion for pager number during day hours 7:00am-4:30pm

## 2023-01-19 NOTE — Progress Notes (Signed)
Occupational Therapy Treatment Patient Details Name: Omar Kennedy MRN: [id number] DOB: 02-26-1946 Today's Date: 01/19/2023   History of present illness Pt is 77 yo male who presents on 01/04/23 after falling into empty pool. Sustained R scalp laceration, T11 fx, multiple T/L TP fxs, SAH/ SDH, R chest wall hematoma, R clavicle fx (NWB, sling), L scapula fx (WBAT), R rib fxs 5-6, R HTX. PMH: dementia   OT comments  Pt not truly progressing, he has poor recall of precautions and needs physical assist/reminder to maintain them. Pt ambulating with Min A HHA, needs some form of external support to maintain balance. He remains confused, sometimes his statements make no sense, one minute he may be reasonable the next he may be more dismissive. OT to continue to progress pt as able, DC plans remain appropriate for SNF   Recommendations for follow up therapy are one component of a multi-disciplinary discharge planning process, led by the attending physician.  Recommendations may be updated based on patient status, additional functional criteria and insurance authorization.    Assistance Recommended at Discharge Frequent or constant Supervision/Assistance  Patient can return home with the following  Assistance with cooking/housework;Direct supervision/assist for financial management;Direct supervision/assist for medications management;Assistance with feeding;Assist for transportation;Help with stairs or ramp for entrance;A little help with walking and/or transfers;A lot of help with bathing/dressing/bathroom   Equipment Recommendations  Other (comment) (TBD at next level of care)    Recommendations for Other Services      Precautions / Restrictions Precautions Precautions: Back;Fall Precaution Booklet Issued: No Precaution Comments: pt unable to understand or retain precautions, will need cueing every time Required Braces or Orthoses: Sling;Spinal Brace Spinal Brace: Lumbar corset;Applied in sitting  position Restrictions Weight Bearing Restrictions: Yes RUE Weight Bearing: Non weight bearing LUE Weight Bearing: Weight bearing as tolerated       Mobility Bed Mobility Overal bed mobility: Needs Assistance Bed Mobility: Supine to Sit, Sit to Supine     Supine to sit: Min assist, HOB elevated Sit to supine: Min guard   General bed mobility comments: P with poor display of log roll when attempting to get EOB without assist. Needing manual assist from OT to pull pt into upright position on LUE    Transfers Overall transfer level: Needs assistance Equipment used: 2 person hand held assist Transfers: Sit to/from Stand Sit to Stand: Min assist           General transfer comment: STS from bathroom, rail + Min A HHA     Balance Overall balance assessment: Needs assistance Sitting-balance support: Bilateral upper extremity supported, Feet supported Sitting balance-Leahy Scale: Fair     Standing balance support: Single extremity supported, During functional activity Standing balance-Leahy Scale: Poor                             ADL either performed or assessed with clinical judgement   ADL Overall ADL's : Needs assistance/impaired                     Lower Body Dressing: Total assistance;Sit to/from stand   Toilet Transfer: Minimal assistance;+2 for physical assistance;+2 for safety/equipment Toilet Transfer Details (indicate cue type and reason): Min A HHA +2         Functional mobility during ADLs: Minimal assistance;+2 for physical assistance;+2 for safety/equipment General ADL Comments: Pt needing manual cue to not use grab bar with RUE    Extremity/Trunk Assessment  Vision       Perception     Praxis      Cognition Arousal/Alertness: Lethargic Behavior During Therapy: Flat affect, Impulsive Overall Cognitive Status: History of cognitive impairments - at baseline                                  General Comments: Pt does not recall any precautions, verbal cueing is not sufficient, tactile cueing barely works pt needs manual facilitation        Exercises      Shoulder Instructions       General Comments VSS    Pertinent Vitals/ Pain       Pain Assessment Pain Assessment: Faces Faces Pain Scale: Hurts little more Pain Location: "all over" Pain Descriptors / Indicators: Guarding, Grimacing, Aching Pain Intervention(s): Monitored during session, Repositioned  Home Living                                          Prior Functioning/Environment              Frequency  Min 1X/week        Progress Toward Goals  OT Goals(current goals can now be found in the care plan section)  Progress towards OT goals: Not progressing toward goals - comment (pain and cognition limiting pt's ability to participate fully and progress in therapy)  Acute Rehab OT Goals OT Goal Formulation: Patient unable to participate in goal setting Time For Goal Achievement: 01/21/23 Potential to Achieve Goals: Good  Plan Discharge plan remains appropriate;Frequency remains appropriate    Co-evaluation                 AM-PAC OT "6 Clicks" Daily Activity     Outcome Measure   Help from another person eating meals?: A Little Help from another person taking care of personal grooming?: A Little Help from another person toileting, which includes using toliet, bedpan, or urinal?: A Little Help from another person bathing (including washing, rinsing, drying)?: A Lot Help from another person to put on and taking off regular upper body clothing?: A Little Help from another person to put on and taking off regular lower body clothing?: A Lot 6 Click Score: 16    End of Session Equipment Utilized During Treatment: Back brace  OT Visit Diagnosis: Muscle weakness (generalized) (M62.81);Pain;History of falling (Z91.81)   Activity Tolerance Patient limited by lethargy    Patient Left in bed;with call bell/phone within reach;with bed alarm set   Nurse Communication Mobility status        Time: 2956-2130 OT Time Calculation (min): 14 min  Charges: OT General Charges $OT Visit: 1 Visit OT Treatments $Therapeutic Activity: 8-22 mins  01/19/2023  AB, OTR/L  Acute Rehabilitation Services  Office: [phone]   [name] 01/19/2023, 4:21 PM

## 2023-01-19 NOTE — TOC Progression Note (Signed)
Transition of Care [facility]) - Progression Note    Patient Details  Name: Omar Kennedy MRN: [id number] Date of Birth: 1945/11/12  [facility]) CM/SW Contact  Astrid Drafts Berna Spare, RN Phone Number: 01/19/2023, 2:00pm  Clinical Narrative:    Spoke with patient's spouse, [name], regarding SNF bed offers: Wife states she and family are not happy with current bed offers and their ratings.  Unfortunately, [facility] and [facility] have both declined patient for admission.  Agreed to send patient information out to additional SNF's in [place], in hopes that they may find a more highly related facility.  Will follow-up with family when bed offers are available.   Expected Discharge Plan: Skilled Nursing Facility Barriers to Discharge: English as a second language teacher, Other (must enter comment) (snf choice)  Expected Discharge Plan and Services   Discharge Planning Services: CM Consult   Living arrangements for the past 2 months: Single Family Home                                       Social Determinants of Health (SDOH) Interventions    Readmission Risk Interventions     No data to display         Quintella Baton, RN, BSN  Trauma/Neuro ICU Case Manager [phone]

## 2023-01-19 NOTE — Progress Notes (Signed)
Nutrition Follow-up  DOCUMENTATION CODES:   Severe malnutrition in context of chronic illness  INTERVENTION:  Provide 1:1 assistance with meals.  Provide Ensure Enlive po TID, each supplement provides 350 kcal and 20 grams of protein.  Provide Magic cup BID with lunch and dinner, each supplement provides 290 kcal and 9 grams of protein.  Continue  multivitamin with minerals po daily.  NUTRITION DIAGNOSIS:   Severe Malnutrition related to chronic illness as evidenced by severe fat depletion, severe muscle depletion.  Ongoing - addressing with interventions.  GOAL:   Patient will meet greater than or equal to 90% of their needs  Not met - addressing with interventions.  MONITOR:   TF tolerance, PO intake, Supplement acceptance, Labs, Weight trends  REASON FOR ASSESSMENT:   Consult Enteral/tube feeding initiation and management  ASSESSMENT:   77 yo male admitted after being found down in empty swimming pool with multiple injuries including T11 fracture with multiple thoracic and lumbar spine transverse process fractures, SAH/SDH, R clavicle fx, L scapular fx, R fib Fx, R chest wall hematoma. PMH includes dementia  6/17: admitted 6/19: recommended regular diet with nectar-thick liquids by SLP 6/24: Cortrak placed and initiated on tube feeds 6/26: diet changed to regular with thin liquids per SLP 6/27: tube feeds stopped and Cortrak tube removed after family discussion with Palliative Medicine; family does not want PEG tube for pt  Pt has been followed by Palliative Medicine who is meeting with family to discuss goals of care. Family would like to continue with encouragement of intake and trial at SNF/rehab and then consider transition to hospice facility if oral intake decreases and/or continues to decline at SNF.  Met with patient at bedside. No family members present at time of RD assessment. Patient only oriented to self at this time. Per review of chart pt ate 10% of  breakfast yesterday and 10% of another meal at 3PM yesterday. Breakfast tray untouched at bedside. RN reports pt ate a couple Cheez-it crackers this morning. Patient reports his appetite is "terrible" still. Noted Ensure supplement at bedside and he reports he tried supplement and enjoyed. He also reported he would like to try Magic Cup supplements again. RD will order Ensure and Magic Cup to help supplement oral intake. Per discussion with surgical PA who was leaving room as RD entered, pt may benefit from having food set up for him. Will place order for 1:1 assistance at meals.  Admission wt 77.1 kg but unsure of accuracy as pt was 69.4 kg on 6/24. Most recent wt 65.2 kg on 7/1.  Medications reviewed and include: calcium carbonate 1250 mg daily with breakfast, carvedilol, vitamin D3 1000 international units daily, decadron 4 mg daily, Colace 100 mg BID, Megace 400 mg daily, MVI daily, Zofran 4 mg TID before meals, Miralax  Labs reviewed: CBG 100-149. On 6/27 Sodium 131, Chloride 96, CO2 20  UOP: 1 occurrence unmeasured UOP in previous 24 hours  I/O: -4218.8 mL since admission  Discussed with RN.   Diet Order:   Diet Order             Diet regular Room service appropriate? No; Fluid consistency: Thin  Diet effective now                  EDUCATION NEEDS:   Education needs have been addressed (pt's wife and sister-in-law at bedside)  Skin:  Skin Assessment: Skin Integrity Issues: Skin Integrity Issues:: Other (Comment) Other: facial laceration  Last BM:  01/16/23  last documented BM  Height:   Ht Readings from Last 1 Encounters:  01/04/23 5\' 10"  (1.778 m)   Weight:   Wt Readings from Last 1 Encounters:  01/18/23 65.2 kg   BMI:  Body mass index is 20.62 kg/m.  Estimated Nutritional Needs:   Kcal:  2000-2200 kcals  Protein:  115-130 g  Fluid:  >/= 2L  Dorthy Magnussen Tollie Eth, MS, RD, LDN, CNSC Pager number available on Amion

## 2023-01-20 LAB — CBC
HCT: 29.4 % — ABNORMAL LOW (ref 39.0–52.0)
Hemoglobin: 9.6 g/dL — ABNORMAL LOW (ref 13.0–17.0)
MCH: 30.4 pg (ref 26.0–34.0)
MCHC: 32.7 g/dL (ref 30.0–36.0)
MCV: 93 fL (ref 80.0–100.0)
Platelets: 257 10*3/uL (ref 150–400)
RBC: 3.16 MIL/uL — ABNORMAL LOW (ref 4.22–5.81)
RDW: 16.7 % — ABNORMAL HIGH (ref 11.5–15.5)
WBC: 6.6 10*3/uL (ref 4.0–10.5)
nRBC: 0 % (ref 0.0–0.2)

## 2023-01-20 LAB — GLUCOSE, CAPILLARY
Glucose-Capillary: 103 mg/dL — ABNORMAL HIGH (ref 70–99)
Glucose-Capillary: 145 mg/dL — ABNORMAL HIGH (ref 70–99)
Glucose-Capillary: 146 mg/dL — ABNORMAL HIGH (ref 70–99)
Glucose-Capillary: 108 mg/dL — ABNORMAL HIGH (ref 70–99)

## 2023-01-20 MED ORDER — IBUPROFEN 400 MG PO TABS: 400.0000 mg | ORAL_TABLET | Freq: Four times a day (QID) | ORAL | Status: DC | PRN

## 2023-01-20 MED ORDER — SENNA 8.6 MG PO TABS
2.0000 | ORAL_TABLET | Freq: Every day | ORAL | Status: DC
  Administered 2023-01-20 – 2023-01-21 (×2): 17.2 mg via ORAL
  Filled 2023-01-20 (×2): qty 2

## 2023-01-20 NOTE — TOC Progression Note (Signed)
Transition of Care [facility]) - Progression Note    Patient Details  Name: Omar Kennedy MRN: [id number] Date of Birth: 29-May-1946  Transition of Care [facility]) CM/SW Contact  Astrid Drafts Berna Spare, RN Phone Number: 01/20/2023, 5:10 PM  Clinical Narrative:    Patient's wife and daughter have investigated multiple skilled nursing facilities throughout the day, and have toured some facilities.  Unfortunately their first and second choices have declined patient for admission. Called multiple other facilities including [facility], [address] and [facility] without success.   Received call from wife that they have toured [facility] and they would like to accept bed offer; notified Patton Salles in admissions.  Anticipate [employer] insurance to be closed tomorrow.  We will start auth on Friday AM, and hope for discharge on Friday or over the weekend.    Expected Discharge Plan: Skilled Nursing Facility Barriers to Discharge: English as a second language teacher, Other (must enter comment) (snf choice)  Expected Discharge Plan and Services   Discharge Planning Services: CM Consult   Living arrangements for the past 2 months: Single Family Home                                       Social Determinants of Health (SDOH) Interventions    Readmission Risk Interventions     No data to display         Quintella Baton, RN, BSN  Trauma/Neuro ICU Case Manager [phone]

## 2023-01-20 NOTE — Progress Notes (Signed)
Progress Note     Subjective: No acute changes   Objective: Vital signs in last 24 hours: Temp:  [97.4 F (36.3 C)-98.2 F (36.8 C)] 97.6 F (36.4 C) (07/03 0745) Pulse Rate:  [64-74] 74 (07/03 0745) Resp:  [16-18] 16 (07/03 0745) BP: (132-145)/(59-80) 145/63 (07/03 0745) SpO2:  [96 %-98 %] 98 % (07/03 0745) Weight:  [67.8 kg-70 kg] 70 kg (07/03 0257) Last BM Date :  (UTA)  Intake/Output from previous day: 07/02 0701 - 07/03 0700 In: 240 [P.O.:240] Out: -  Intake/Output this shift: No intake/output data recorded.  PE: Gen: comfortable, no distress Neck: contusion over R neck. CV: RRR Pulm: right chest wall hematoma stable Abd: soft, NT, nondistended. Extr: wwp, no edema   Lab Results:  No results for input(s): "WBC", "HGB", "HCT", "PLT" in the last 72 hours.  BMET No results for input(s): "NA", "K", "CL", "CO2", "GLUCOSE", "BUN", "CREATININE", "CALCIUM" in the last 72 hours.  PT/INR No results for input(s): "LABPROT", "INR" in the last 72 hours. CMP     Component Value Date/Time   NA 131 (L) 01/14/2023 0905   K 4.3 01/14/2023 0905   CL 96 (L) 01/14/2023 0905   CO2 20 (L) 01/14/2023 0905   GLUCOSE 152 (H) 01/14/2023 0905   BUN 18 01/14/2023 0905   CREATININE 0.69 01/14/2023 0905   CALCIUM 8.3 (L) 01/14/2023 0905   PROT 4.9 (L) 01/04/2023 1412   ALBUMIN 3.2 (L) 01/04/2023 1412   AST 69 (H) 01/04/2023 1412   ALT 40 01/04/2023 1412   ALKPHOS 52 01/04/2023 1412   BILITOT 2.4 (H) 01/04/2023 1412   GFRNONAA >60 01/14/2023 0905   Lipase  No results found for: "LIPASE"     Studies/Results: No results found.  Anti-infectives: Anti-infectives (From admission, onward)    Start     Dose/Rate Route Frequency Ordered Stop   01/04/23 1530  ceFAZolin (ANCEF) IVPB 2g/100 mL premix        2 g 200 mL/hr over 30 Minutes Intravenous  Once 01/04/23 1526 01/04/23 1634        Assessment/Plan  Found down in an empty swimming pool    R scalp laceration  - closed in ED with 9 staples on 6/17; removed staples 6/27 T11 fracture, multiple thoracic and lumbar spine transverse process fractures - per NS c/s, mobilize in brace SAH/SDH - NS c/s Dr. Yetta Barre, repeat CT stable 6/18, IV keppra x 7 days; Neuro consult to rule out other sources of AMS. EEG without epileptiform changes. MRI stable with bilateral subdural hematoma. Neuro signed off.  R chest wall hematoma - stable R clavicle fracture - ortho c/s, sling, NWB L scapula fracture - ortho c/s, WBAT R HTX, R Fib FX 5-6, small PTX- Multimodal pain control, IS, follow up CXR 6/18 without PTX, Follow up CT without increase in HTX and small (<5%) PTX ABL anemia - s/p 1 u RBC 6/17, hgb 8.4 6/21 C-collar - significant motion on CT, but no fx, unable to participate in clinical exam, likely not going to sit still for MRI if unable to sit still enough for CT. Flex ex performed 6/18 and c collar was removed.   FEN - Reg diet/nectar thick as tolerated, Cortrak removed 6/27 VTE - SCD's, LMWH ID - Tdap given in ED Dispo - 6N, plan discharge to SNF working on placement currently. Patient medically stable for DC when facility determined and insurance approves.    LOS: 16 days   I reviewed nursing notes, Consultant  palliative notes, last 24 h vitals and pain scores, and last 48 h intake and output.    Juliet Rude, [facility] 01/20/2023, 9:45 AM Please see Amion for pager number during day hours 7:00am-4:30pm

## 2023-01-20 NOTE — Progress Notes (Signed)
   Medical records reviewed including progress notes, labs, and imaging. Goals of care remain for discharge to SNF with outpatient palliative care follow up, potentially transition to hospice if he continues to decline despite efforts at rehabilitation.  Patient is medically sable for discharge, however disposition is complicated by family dissatisfaction with current bed offers. Appreciate TOC assistance.   PMT will continue to follow peripherally. Family have been encouraged to reach out with questions, concerns, or other palliative needs.   Thank you for your referral and allowing PMT to assist in [name]'s care.   Richardson Dopp, [facility] Palliative Medicine Team  Team Phone # [phone]   NO CHARGE

## 2023-01-21 LAB — GLUCOSE, CAPILLARY
Glucose-Capillary: 90 mg/dL (ref 70–99)
Glucose-Capillary: 101 mg/dL — ABNORMAL HIGH (ref 70–99)
Glucose-Capillary: 101 mg/dL — ABNORMAL HIGH (ref 70–99)

## 2023-01-21 NOTE — Progress Notes (Signed)
Physical Therapy Treatment Patient Details Name: Omar Kennedy MRN: [id number] DOB: 15-Jun-1946 Today's Date: 01/21/2023   History of Present Illness Pt is 77 yo male who presents on 01/04/23 after falling into empty pool. Sustained R scalp laceration, T11 fx, multiple T/L TP fxs, SAH/ SDH, R chest wall hematoma, R clavicle fx (NWB, sling), L scapula fx (WBAT), R rib fxs 5-6, R HTX. PMH: dementia    PT Comments  Pt tolerated treatment well today. Pt able to ambulate in hallway today with Mod A 1 person HHA however continues to be very limited by cognitive deficits with no regard for WB precautions whatsoever. No change in DC/DME recs at this time. PT will continue to follow.     Assistance Recommended at Discharge Frequent or constant Supervision/Assistance  If plan is discharge home, recommend the following:  Can travel by private vehicle    A lot of help with walking and/or transfers;A lot of help with bathing/dressing/bathroom;Assistance with cooking/housework;Assistance with feeding;Direct supervision/assist for medications management;Direct supervision/assist for financial management;Assist for transportation;Help with stairs or ramp for entrance   No  Equipment Recommendations  Other (comment) (Per accepting facility)    Recommendations for Other Services       Precautions / Restrictions Precautions Precautions: Back;Fall Precaution Booklet Issued: No Precaution Comments: pt unable to understand or retain precautions, will need cueing every time Restrictions Weight Bearing Restrictions: Yes RUE Weight Bearing: Non weight bearing LUE Weight Bearing: Weight bearing as tolerated     Mobility  Bed Mobility Overal bed mobility: Needs Assistance Bed Mobility: Supine to Sit, Sit to Supine     Supine to sit: Min assist, HOB elevated Sit to supine: Supervision   General bed mobility comments: Pt with HHA to go from supine to sit. Pt able to get back in bed with supervision.     Transfers Overall transfer level: Needs assistance Equipment used: 1 person hand held assist Transfers: Sit to/from Stand Sit to Stand: Min assist           General transfer comment: Pt again with no regards for WB precautions    Ambulation/Gait Ambulation/Gait assistance: Mod assist Gait Distance (Feet): 50 Feet Assistive device: 1 person hand held assist Gait Pattern/deviations: Wide base of support, Staggering left, Staggering right, Decreased stride length, Step-through pattern Gait velocity: decreased     General Gait Details: Pt required constant verbal, tactile, and even multimodal cues for hallway navigation and direction following.   Stairs             Wheelchair Mobility     Tilt Bed    Modified Rankin (Stroke Patients Only)       Balance Overall balance assessment: Needs assistance Sitting-balance support: Bilateral upper extremity supported, Feet supported Sitting balance-Leahy Scale: Fair     Standing balance support: Single extremity supported, During functional activity Standing balance-Leahy Scale: Poor Standing balance comment: Reliant on therapist to maintain balance. Pt also pressed legs against bed.                            Cognition Arousal/Alertness: Awake/alert Behavior During Therapy: Flat affect, Impulsive Overall Cognitive Status: History of cognitive impairments - at baseline                                 General Comments: Pt does not recall any precautions, verbal cueing is not sufficient, tactile cueing barely  works pt needs Contractor comments (skin integrity, edema, etc.): VSS on RA      Pertinent Vitals/Pain Pain Assessment Pain Assessment: No/denies pain    Home Living                          Prior Function            PT Goals (current goals can now be found in the care plan section) Progress towards PT  goals: Progressing toward goals    Frequency    Min 3X/week      PT Plan Current plan remains appropriate    Co-evaluation              AM-PAC PT "6 Clicks" Mobility   Outcome Measure  Help needed turning from your back to your side while in a flat bed without using bedrails?: A Little Help needed moving from lying on your back to sitting on the side of a flat bed without using bedrails?: A Little Help needed moving to and from a bed to a chair (including a wheelchair)?: A Lot Help needed standing up from a chair using your arms (e.g., wheelchair or bedside chair)?: A Little Help needed to walk in hospital room?: A Lot Help needed climbing 3-5 steps with a railing? : A Lot 6 Click Score: 15    End of Session Equipment Utilized During Treatment: Gait belt Activity Tolerance: Patient tolerated treatment well Patient left: in bed;with call bell/phone within reach;with bed alarm set;with family/visitor present Nurse Communication: Mobility status PT Visit Diagnosis: History of falling (Z91.81);Difficulty in walking, not elsewhere classified (R26.2)     Time: 1209-1219 PT Time Calculation (min) (ACUTE ONLY): 10 min  Charges:    $Gait Training: 8-22 mins PT General Charges $$ ACUTE PT VISIT: 1 Visit                     Shela Nevin, PT, DPT Acute Rehab Services [phone]    Gladys Damme 01/21/2023, 4:06 PM

## 2023-01-21 NOTE — Progress Notes (Signed)
Patient ID: Omar Kennedy, male   DOB: October 19, 1945, 77 y.o.   MRN: [id number]   Trauma Service Progress Note:    Chief Complaint/Subjective: Vitals stable past 24hrs No acute events  Objective: Vital signs in last 24 hours: Temp:  [97.6 F (36.4 C)-98.5 F (36.9 C)] 97.6 F (36.4 C) (07/04 0822) Pulse Rate:  [71-80] 74 (07/04 0822) Resp:  [16-18] 17 (07/04 0822) BP: (131-151)/(60-72) 138/61 (07/04 0822) SpO2:  [93 %-100 %] 93 % (07/04 0822) Weight:  [68.8 kg] 68.8 kg (07/04 0500) Last BM Date :  (unknown)  Intake/Output from previous day: 07/03 0701 - 07/04 0700 In: 120 [P.O.:120] Out: -  Intake/Output this shift: No intake/output data recorded.  Gen: comfortable, no distress Neck: contusion over R neck. CV: RRR Pulm: right chest wall hematoma stable Abd: soft, NT, nondistended. Extr: wwp, no edema  Lab Results: CBC  Recent Labs    01/20/23 0942  WBC 6.6  HGB 9.6*  HCT 29.4*  PLT 257   BMET No results for input(s): "NA", "K", "CL", "CO2", "GLUCOSE", "BUN", "CREATININE", "CALCIUM" in the last 72 hours. LFT    Latest Ref Rng & Units 01/04/2023    2:12 PM  Hepatic Function  Total Protein 6.5 - 8.1 g/dL 4.9   Albumin 3.5 - 5.0 g/dL 3.2   AST 15 - 41 U/L 69   ALT 0 - 44 U/L 40   Alk Phosphatase 38 - 126 U/L 52   Total Bilirubin 0.3 - 1.2 mg/dL 2.4    PT/INR No results for input(s): "LABPROT", "INR" in the last 72 hours. ABG No results for input(s): "PHART", "HCO3" in the last 72 hours.  Invalid input(s): "PCO2", "PO2"  Studies/Results:  Anti-infectives: Anti-infectives (From admission, onward)    Start     Dose/Rate Route Frequency Ordered Stop   01/04/23 1530  ceFAZolin (ANCEF) IVPB 2g/100 mL premix        2 g 200 mL/hr over 30 Minutes Intravenous  Once 01/04/23 1526 01/04/23 1634       Medications: Scheduled Meds:  sodium chloride   Intravenous Once   acetaminophen  1,000 mg Oral Q6H   calcium carbonate  1,250 mg Oral Q breakfast    carvedilol  6.25 mg Oral BID WC   cholecalciferol  1,000 Units Oral Daily   dexamethasone  4 mg Oral Daily   docusate sodium  100 mg Oral BID   enoxaparin (LOVENOX) injection  30 mg Subcutaneous Q12H   escitalopram  5 mg Oral Daily   feeding supplement  237 mL Oral TID BM   fluticasone  1 spray Each Nare QPM   lidocaine  2 patch Transdermal Q24H   loratadine  10 mg Oral Daily   megestrol  400 mg Oral Daily   multivitamin with minerals  1 tablet Oral Daily   ondansetron  4 mg Oral TID AC   polyethylene glycol  17 g Per Tube BID   QUEtiapine  25 mg Oral QHS   rivastigmine  3 mg Oral BID   rosuvastatin  40 mg Oral QHS   senna  2 tablet Oral QHS   vitamin B-12  100 mcg Oral Daily   Continuous Infusions: PRN Meds:.haloperidol lactate, hydrALAZINE, ibuprofen, metoprolol tartrate, ondansetron **OR** ondansetron (ZOFRAN) IV, mouth rinse, oxyCODONE  Assessment/Plan: Patient Active Problem List   Diagnosis Date Noted   Protein-calorie malnutrition, severe 01/18/2023   Fall from height of greater than 3 feet 01/04/2023   Found down in an empty swimming pool  R scalp laceration - closed in ED with 9 staples on 6/17; removed staples 6/27 T11 fracture, multiple thoracic and lumbar spine transverse process fractures - per NS c/s, mobilize in brace SAH/SDH - NS c/s Dr. Yetta Barre, repeat CT stable 6/18, IV keppra x 7 days; Neuro consult to rule out other sources of AMS. EEG without epileptiform changes. MRI stable with bilateral subdural hematoma. Neuro signed off.  R chest wall hematoma - stable R clavicle fracture - ortho c/s, sling, NWB L scapula fracture - ortho c/s, WBAT R HTX, R Fib FX 5-6, small PTX- Multimodal pain control, IS, follow up CXR 6/18 without PTX, Follow up CT without increase in HTX and small (<5%) PTX ABL anemia - s/p 1 u RBC 6/17, hgb 8.4 6/21 C-collar - significant motion on CT, but no fx, unable to participate in clinical exam, likely not going to sit still for MRI if  unable to sit still enough for CT. Flex ex performed 6/18 and c collar was removed.   FEN - Reg diet/nectar thick as tolerated, Cortrak removed 6/27 VTE - SCD's, LMWH ID - Tdap given in ED Dispo - 6N, plan discharge to SNF working on placement currently. Patient medically stable for DC when facility determined and insurance approves.     LOS: 17 days    I reviewed nursing notes, Consultant palliative notes, last 24 h vitals and pain scores, and last 48 h intake and output.   LOS: 17 days    Omar Kennedy. Andrey Campanile, MD, FACS General, Bariatric, & Minimally Invasive Surgery [phone] [facility], [facility]

## 2023-01-21 NOTE — Plan of Care (Signed)

## 2023-01-22 MED ORDER — QUETIAPINE FUMARATE 25 MG PO TABS: 25.0000 mg | ORAL_TABLET | Freq: Every day | ORAL | Status: DC

## 2023-01-22 MED ORDER — DOCUSATE SODIUM 100 MG PO CAPS: 100.0000 mg | ORAL_CAPSULE | Freq: Two times a day (BID) | ORAL | 0 refills | Status: AC | PRN

## 2023-01-22 MED ORDER — MEGESTROL ACETATE 400 MG/10ML PO SUSP: 400.0000 mg | Freq: Every day | ORAL | 0 refills | Status: DC

## 2023-01-22 MED ORDER — SENNA 8.6 MG PO TABS: 2.0000 | ORAL_TABLET | Freq: Every day | ORAL | 0 refills | Status: AC

## 2023-01-22 MED ORDER — POLYETHYLENE GLYCOL 3350 17 G PO PACK: 17.0000 g | PACK | Freq: Two times a day (BID) | ORAL | 0 refills | Status: DC | PRN

## 2023-01-22 MED ORDER — LIDOCAINE 5 % EX PTCH: 2.0000 | MEDICATED_PATCH | CUTANEOUS | 0 refills | Status: AC

## 2023-01-22 MED ORDER — DEXAMETHASONE 4 MG PO TABS: 4.0000 mg | ORAL_TABLET | Freq: Every day | ORAL | Status: DC

## 2023-01-22 NOTE — TOC Transition Note (Signed)
Transition of Care [facility]) - CM/SW Discharge Note   Patient Details  Name: SANDRA LAFEVERS MRN: [id number] Date of Birth: 01/31/1946  Transition of Care [facility]) CM/SW Contact:  Glennon Mac, RN Phone Number: 01/22/2023, 2:06pm  Clinical Narrative:    Patient medically stable for discharge today and insurance authorization has been received for admission to [facility] SN[facility].-- Berkley Harvey ID [id number], approved 7/5-7/9.  Notified facility, attending MD and patient's family, and all are in agreement with dc to SNF today.  Will forward discharge summary and transfer report when available.  Patient will go to room 3214; bedside nurse to call report to [phone].    Addendum:  Notified [facility] for transport at 3:15pm.      Final next level of care: Skilled Nursing Facility Barriers to Discharge: Barriers Resolved   Patient Goals and CMS Choice CMS Medicare.gov Compare Post Acute Care list provided to:: Patient Represenative (must comment) (wife, daughter) Choice offered to / list presented to : Adult Children, Spouse  Discharge Placement PASRR number recieved: 01/07/23 PASRR number recieved: 01/07/23            Patient chooses bed at: [facility] Patient to be transferred to facility by: [facility] Name of family member notified: [name], wife Patient and family notified of of transfer: 01/22/23  Discharge Plan and Services Additional resources added to the After Visit Summary for     Discharge Planning Services: CM Consult Post Acute Care Choice: Skilled Nursing Facility                               Social Determinants of Health (SDOH) Interventions     Readmission Risk Interventions     No data to display         Quintella Baton, RN, BSN  Trauma/Neuro ICU Case Manager [phone]

## 2023-01-22 NOTE — TOC Progression Note (Signed)
Transition of Care [facility]) - Progression Note    Patient Details  Name: Omar Kennedy MRN: [id number] Date of Birth: March 10, 1946  Transition of Care [facility]) CM/SW Contact  Astrid Drafts Berna Spare, RN Phone Number: 01/22/2023, 10:19 AM  Clinical Narrative:    Vesta Mixer has been initiated; auth id is [id number].  Will provide updates as available.    Expected Discharge Plan: Skilled Nursing Facility Barriers to Discharge: English as a second language teacher, Other (must enter comment) (snf choice)  Expected Discharge Plan and Services   Discharge Planning Services: CM Consult   Living arrangements for the past 2 months: Single Family Home                                       Social Determinants of Health (SDOH) Interventions    Readmission Risk Interventions     No data to display         Quintella Baton, RN, BSN  Trauma/Neuro ICU Case Manager [phone]

## 2023-01-22 NOTE — Discharge Summary (Addendum)
Patient ID: ASIF HASEK [id number] Sep 18, 1945 77 y.o.  Admit date: 01/04/2023 Discharge date: 01/22/2023  Discharge Diagnosis Found down in an empty swimming pool R scalp laceration  T11 fracture, multiple thoracic and lumbar spine transverse process fractures  SAH/SDH  R chest wall hematoma R clavicle fracture  L scapula fracture  R HTX, R Fib FX 5-6, small PTX ABL anemia   Consultants Ortho NSGY Neuro Palliative   H&P Omar Kennedy is a 77 y/o M with a PMH dementia who presented to the ED as a level 2 trauma after he was found down in an empty swimming pool behind his house. Last known normal was yesterday evening 2000. His wife could not find him this morning, called her neighbors, and they found him in the pool asking for help. Upgraded to level 1 due to hypotension. He is talking in the trauma bay but is only oriented to self. He screams in pain with movement. He has an obvious scalp laceration and right anterior chest wall contusion. Systolic pressure came up to >100 mmhg after 1 u pRBC and he was taken to the CT scanner.   Procedures Scalp laceration closure with staples 6/17  Hospital Course:  Patient presented as above after being found down in an empty swimming pool. He was admitted to Trauma for the below injuries.    R scalp laceration - closed in ED with 9 staples on 6/17; removed staples 6/27  T11 fracture, multiple thoracic and lumbar spine transverse process fractures - NSGY consulted, Dr. Yetta Barre. Okay to mobilize in LSO brace  SAH/SDH - NSGY consulted Dr. Yetta Barre. Repeat CT stable 6/18. Tx w/ IV keppra x 7 days. Patient noted to have AMS. Neuro consult to rule out other sources of AMS. EEG without epileptiform changes. MRI stable with bilateral subdural hematoma. Neuro signed off. Palliative care was involved for GOC conversations. Plan DNR/DNI. Family did not wish for PEG. Cortrak was pulled with plan to see how appetite does by Saturday 6/29. SLP evaluated  and patient passed for regular diet (meds to be given crushed with puree with patient upright at 90 degree's). "Family would like to continue with encouragement of intake and trial at SNF/rehab and then consider transition to hospice facility if oral intake decreases and/or continues to decline at SNF." They will have outpatient palliative care f/u.   R chest wall hematoma - See ABL section.   R clavicle fracture - ortho c/s, sling, NWB. F/u with Dr. Eulah Pont (provider on call on day of consult)  L scapula fracture - ortho c/s, WBAT. F/u with Dr. Eulah Pont (provider on call on day of consult)  R HTX, R Fib FX 5-6, small PTX- Did not require chest tube. Follow up imaging stable  ABL anemia - Required 1U PRBC 6/17. Serial hgb's monitored until stable. Required 1U PRBC 6/17  C-collar - significant motion on CT, but no fx, unable to participate in clinical exam, likely not going to sit still for MRI if unable to sit still enough for CT. Flex ex performed 6/18 and c collar was removed.  On 7/5 patient was felt stable for d/c to SNF.   I was not directly involved in this patient's care and did not see the patient during their hospital stay, therefore the information in this discharge summary was taken entirely from the chart.   Allergies as of 01/22/2023       Reactions   Cephalexin Other (See Comments)   vertigo  Medication List     TAKE these medications    acetaminophen 500 MG tablet Commonly known as: TYLENOL Take 1,000 mg by mouth every 6 (six) hours as needed for headache.   amLODipine 5 MG tablet Commonly known as: NORVASC Take 5 mg by mouth daily.   aspirin EC 81 MG tablet Take 81 mg by mouth daily. Swallow whole.   calcium carbonate 1250 (500 Ca) MG chewable tablet Commonly known as: OS-CAL Chew 1 tablet by mouth daily.   carvedilol 12.5 MG tablet Commonly known as: COREG Take 6.25 mg by mouth daily.   cetirizine 10 MG tablet Commonly known as: ZYRTEC Take 10  mg by mouth daily.   cholecalciferol 25 MCG (1000 UNIT) tablet Commonly known as: VITAMIN D3 Take 1,000 Units by mouth daily. gummies   docusate sodium 100 MG capsule Commonly known as: COLACE Take 1 capsule (100 mg total) by mouth 2 (two) times daily as needed for mild constipation.   escitalopram 5 MG tablet Commonly known as: LEXAPRO Take 5 mg by mouth daily.   fluticasone 50 MCG/ACT nasal spray Commonly known as: FLONASE Place 1 spray into both nostrils every evening.   HAIR SKIN AND NAILS FORMULA PO Take 1 tablet by mouth daily.   lidocaine 5 % Commonly known as: LIDODERM Place 2 patches onto the skin daily. Remove & Discard patch within 12 hours or as directed by MD   megestrol 400 MG/10ML suspension Commonly known as: MEGACE Take 10 mLs (400 mg total) by mouth daily. Start taking on: January 23, 2023   pantoprazole 40 MG tablet Commonly known as: PROTONIX Take 40 mg by mouth at bedtime.   polyethylene glycol 17 g packet Commonly known as: MIRALAX / GLYCOLAX Place 17 g into feeding tube 2 (two) times daily as needed.   QUEtiapine 25 MG tablet Commonly known as: SEROQUEL Take 1 tablet (25 mg total) by mouth at bedtime.   rivastigmine 3 MG capsule Commonly known as: EXELON Take 3 mg by mouth 2 (two) times daily.   rosuvastatin 40 MG tablet Commonly known as: CRESTOR Take 40 mg by mouth at bedtime.   senna 8.6 MG Tabs tablet Commonly known as: SENOKOT Take 2 tablets (17.2 mg total) by mouth at bedtime.   vitamin B-12 100 MCG tablet Commonly known as: CYANOCOBALAMIN Take 100 mcg by mouth daily. gummies      Will check with my colleague on if patient requires decadron at d/c.    Contact information for follow-up providers     [facility] Follow up.   Contact information: [address] [place] [address] [phone]         Sheral Apley, MD Follow up.   Specialty: Orthopedic Surgery Why: Regarding your right clavicle  and Left scapula fracture Contact information: [address] [place] [address] [phone]         Arman Bogus, MD Follow up.   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 [place] [address] [phone]              Contact information for after-discharge care     Destination     [facility] Preferred SNF .   Service: Skilled Nursing Contact information: [address] [place] [address] [phone]                     Signed: Casimiro Needle  Wendle Kina, [facility] 01/22/2023, 3:13 PM Please see Amion for pager number during day hours 7:00am-4:30pm

## 2023-01-22 NOTE — Discharge Instructions (Addendum)
Please remain non weight bearing, in a sling, to your right upper extremity You can weight bear as tolerated to the left upper extremity Follow up with Dr. Eulah Pont of Orthopedics  Please wear your back brace when you are mobilizing

## 2023-01-22 NOTE — Progress Notes (Signed)
Progress Note     Subjective: No acute changes   Objective: Vital signs in last 24 hours: Temp:  [97.7 F (36.5 C)-98.2 F (36.8 C)] 98.2 F (36.8 C) (07/05 0732) Pulse Rate:  [72-87] 87 (07/05 0732) Resp:  [16-19] 16 (07/05 0732) BP: (118-153)/(58-73) 153/73 (07/05 0732) SpO2:  [96 %-100 %] 100 % (07/05 0732) Weight:  [67.2 kg] 67.2 kg (07/05 0558) Last BM Date :  (unknown)  Intake/Output from previous day: 07/04 0701 - 07/05 0700 In: 270 [P.O.:270] Out: -  Intake/Output this shift: No intake/output data recorded.  PE: Gen: comfortable, no distress Neck: contusion over R neck. CV: RRR Pulm: right chest wall hematoma stable Abd: soft, NT, nondistended. Extr: wwp, no edema   Lab Results:  Recent Labs    01/20/23 0942  WBC 6.6  HGB 9.6*  HCT 29.4*  PLT 257    BMET No results for input(s): "NA", "K", "CL", "CO2", "GLUCOSE", "BUN", "CREATININE", "CALCIUM" in the last 72 hours.  PT/INR No results for input(s): "LABPROT", "INR" in the last 72 hours. CMP     Component Value Date/Time   NA 131 (L) 01/14/2023 0905   K 4.3 01/14/2023 0905   CL 96 (L) 01/14/2023 0905   CO2 20 (L) 01/14/2023 0905   GLUCOSE 152 (H) 01/14/2023 0905   BUN 18 01/14/2023 0905   CREATININE 0.69 01/14/2023 0905   CALCIUM 8.3 (L) 01/14/2023 0905   PROT 4.9 (L) 01/04/2023 1412   ALBUMIN 3.2 (L) 01/04/2023 1412   AST 69 (H) 01/04/2023 1412   ALT 40 01/04/2023 1412   ALKPHOS 52 01/04/2023 1412   BILITOT 2.4 (H) 01/04/2023 1412   GFRNONAA >60 01/14/2023 0905   Lipase  No results found for: "LIPASE"     Studies/Results: No results found.  Anti-infectives: Anti-infectives (From admission, onward)    Start     Dose/Rate Route Frequency Ordered Stop   01/04/23 1530  ceFAZolin (ANCEF) IVPB 2g/100 mL premix        2 g 200 mL/hr over 30 Minutes Intravenous  Once 01/04/23 1526 01/04/23 1634        Assessment/Plan  Found down in an empty swimming pool    R scalp  laceration - closed in ED with 9 staples on 6/17; removed staples 6/27 T11 fracture, multiple thoracic and lumbar spine transverse process fractures - per NS c/s, mobilize in brace SAH/SDH - NS c/s Dr. Yetta Barre, repeat CT stable 6/18, IV keppra x 7 days; Neuro consult to rule out other sources of AMS. EEG without epileptiform changes. MRI stable with bilateral subdural hematoma. Neuro signed off.  R chest wall hematoma - stable R clavicle fracture - ortho c/s, sling, NWB L scapula fracture - ortho c/s, WBAT R HTX, R Fib FX 5-6, small PTX- Multimodal pain control, IS, follow up CXR 6/18 without PTX, Follow up CT without increase in HTX and small (<5%) PTX ABL anemia - s/p 1 u RBC 6/17, hgb 8.4 6/21 C-collar - significant motion on CT, but no fx, unable to participate in clinical exam, likely not going to sit still for MRI if unable to sit still enough for CT. Flex ex performed 6/18 and c collar was removed.   FEN - Reg diet/nectar thick as tolerated, Cortrak removed 6/27 VTE - SCD's, LMWH ID - Tdap given in ED Dispo - 6N, plan discharge to SNF working on placement currently. Patient medically stable for DC when facility determined and insurance approves.    LOS: 18 days  I reviewed nursing notes, last 24 h vitals and pain scores, and last 48 h intake and output.    Juliet Rude, [facility] Surgery 01/22/2023, 10:54 AM Please see Amion for pager number during day hours 7:00am-4:30pm

## 2023-01-29 ENCOUNTER — Emergency Department (HOSPITAL_COMMUNITY): Payer: Medicare PPO

## 2023-01-29 ENCOUNTER — Inpatient Hospital Stay (HOSPITAL_COMMUNITY)
Admission: AD | Admit: 2023-01-29 | Discharge: 2023-02-07 | DRG: 071 | Disposition: A | Discharge status: Skilled Nursing Facility | Payer: Medicare PPO | Source: Skilled Nursing Facility | Attending: Internal Medicine | Admitting: Internal Medicine

## 2023-01-29 ENCOUNTER — Encounter (HOSPITAL_COMMUNITY): Payer: Self-pay | Admitting: Emergency Medicine

## 2023-01-29 ENCOUNTER — Other Ambulatory Visit: Payer: Self-pay

## 2023-01-29 DIAGNOSIS — J90 Pleural effusion, not elsewhere classified: Secondary | ICD-10-CM | POA: Diagnosis present

## 2023-01-29 DIAGNOSIS — Z823 Family history of stroke: Secondary | ICD-10-CM

## 2023-01-29 DIAGNOSIS — W1789XA Other fall from one level to another, initial encounter: Secondary | ICD-10-CM

## 2023-01-29 DIAGNOSIS — Z888 Allergy status to other drugs, medicaments and biological substances status: Secondary | ICD-10-CM

## 2023-01-29 DIAGNOSIS — R4182 Altered mental status, unspecified: Principal | ICD-10-CM

## 2023-01-29 DIAGNOSIS — Z79899 Other long term (current) drug therapy: Secondary | ICD-10-CM

## 2023-01-29 DIAGNOSIS — Z91038 Other insect allergy status: Secondary | ICD-10-CM

## 2023-01-29 DIAGNOSIS — G4733 Obstructive sleep apnea (adult) (pediatric): Secondary | ICD-10-CM | POA: Diagnosis present

## 2023-01-29 DIAGNOSIS — Z87891 Personal history of nicotine dependence: Secondary | ICD-10-CM

## 2023-01-29 DIAGNOSIS — Z7982 Long term (current) use of aspirin: Secondary | ICD-10-CM

## 2023-01-29 DIAGNOSIS — S42001A Fracture of unspecified part of right clavicle, initial encounter for closed fracture: Secondary | ICD-10-CM | POA: Insufficient documentation

## 2023-01-29 DIAGNOSIS — Z881 Allergy status to other antibiotic agents status: Secondary | ICD-10-CM

## 2023-01-29 DIAGNOSIS — Z515 Encounter for palliative care: Secondary | ICD-10-CM

## 2023-01-29 DIAGNOSIS — W1789XD Other fall from one level to another, subsequent encounter: Secondary | ICD-10-CM

## 2023-01-29 DIAGNOSIS — I2510 Atherosclerotic heart disease of native coronary artery without angina pectoris: Secondary | ICD-10-CM | POA: Diagnosis present

## 2023-01-29 DIAGNOSIS — Z66 Do not resuscitate: Secondary | ICD-10-CM | POA: Diagnosis present

## 2023-01-29 DIAGNOSIS — S2241XA Multiple fractures of ribs, right side, initial encounter for closed fracture: Secondary | ICD-10-CM | POA: Diagnosis present

## 2023-01-29 DIAGNOSIS — Z880 Allergy status to penicillin: Secondary | ICD-10-CM

## 2023-01-29 DIAGNOSIS — G9341 Metabolic encephalopathy: Principal | ICD-10-CM | POA: Diagnosis present

## 2023-01-29 DIAGNOSIS — E782 Mixed hyperlipidemia: Secondary | ICD-10-CM | POA: Diagnosis present

## 2023-01-29 DIAGNOSIS — S42001D Fracture of unspecified part of right clavicle, subsequent encounter for fracture with routine healing: Secondary | ICD-10-CM

## 2023-01-29 DIAGNOSIS — F03918 Unspecified dementia, unspecified severity, with other behavioral disturbance: Secondary | ICD-10-CM | POA: Diagnosis present

## 2023-01-29 DIAGNOSIS — I10 Essential (primary) hypertension: Secondary | ICD-10-CM | POA: Diagnosis present

## 2023-01-29 DIAGNOSIS — E7800 Pure hypercholesterolemia, unspecified: Secondary | ICD-10-CM | POA: Diagnosis present

## 2023-01-29 DIAGNOSIS — Z951 Presence of aortocoronary bypass graft: Secondary | ICD-10-CM

## 2023-01-29 LAB — CBC WITH DIFFERENTIAL/PLATELET
Abs Immature Granulocytes: 0.01 10*3/uL (ref 0.00–0.07)
Basophils Absolute: 0 10*3/uL (ref 0.0–0.1)
Basophils Relative: 1 %
Eosinophils Absolute: 0.1 10*3/uL (ref 0.0–0.5)
Eosinophils Relative: 1 %
HCT: 38.1 % — ABNORMAL LOW (ref 39.0–52.0)
Hemoglobin: 12.2 g/dL — ABNORMAL LOW (ref 13.0–17.0)
Immature Granulocytes: 0 %
Lymphocytes Relative: 44 %
Lymphs Abs: 2.3 10*3/uL (ref 0.7–4.0)
MCH: 29.8 pg (ref 26.0–34.0)
MCHC: 32 g/dL (ref 30.0–36.0)
MCV: 92.9 fL (ref 80.0–100.0)
Monocytes Absolute: 0.6 10*3/uL (ref 0.1–1.0)
Monocytes Relative: 12 %
Neutro Abs: 2.2 10*3/uL (ref 1.7–7.7)
Neutrophils Relative %: 42 %
Platelets: 251 10*3/uL (ref 150–400)
RBC: 4.1 MIL/uL — ABNORMAL LOW (ref 4.22–5.81)
RDW: 16.3 % — ABNORMAL HIGH (ref 11.5–15.5)
WBC: 5.2 10*3/uL (ref 4.0–10.5)
nRBC: 0 % (ref 0.0–0.2)

## 2023-01-29 LAB — COMPREHENSIVE METABOLIC PANEL
ALT: 49 U/L — ABNORMAL HIGH (ref 0–44)
AST: 37 U/L (ref 15–41)
Albumin: 3.6 g/dL (ref 3.5–5.0)
Alkaline Phosphatase: 150 U/L — ABNORMAL HIGH (ref 38–126)
Anion gap: 12 (ref 5–15)
BUN: 11 mg/dL (ref 8–23)
CO2: 21 mmol/L — ABNORMAL LOW (ref 22–32)
Calcium: 8.9 mg/dL (ref 8.9–10.3)
Chloride: 104 mmol/L (ref 98–111)
Creatinine, Ser: 0.98 mg/dL (ref 0.61–1.24)
GFR, Estimated: >60 mL/min (ref >60)
Glucose, Bld: 90 mg/dL (ref 70–99)
Potassium: 3.6 mmol/L (ref 3.5–5.1)
Sodium: 137 mmol/L (ref 135–145)
Total Bilirubin: 2.5 mg/dL — ABNORMAL HIGH (ref 0.3–1.2)
Total Protein: 6.4 g/dL — ABNORMAL LOW (ref 6.5–8.1)

## 2023-01-29 LAB — CBG MONITORING, ED: Glucose-Capillary: 88 mg/dL (ref 70–99)

## 2023-01-29 LAB — ETHANOL: Alcohol, Ethyl (B): <10 mg/dL (ref <10)

## 2023-01-29 LAB — MAGNESIUM: Magnesium: 1.9 mg/dL (ref 1.7–2.4)

## 2023-01-29 MED ORDER — LORAZEPAM 2 MG/ML IJ SOLN
1.0000 mg | Freq: Once | INTRAMUSCULAR | Status: DC
  Filled 2023-01-29: qty 1

## 2023-01-29 MED ORDER — LORAZEPAM 2 MG/ML IJ SOLN
0.5000 mg | Freq: Once | INTRAMUSCULAR | Status: AC
  Administered 2023-01-29: 0.5 mg via INTRAVENOUS
  Filled 2023-01-29: qty 1

## 2023-01-29 MED ORDER — LORAZEPAM 2 MG/ML IJ SOLN
2.0000 mg | Freq: Once | INTRAMUSCULAR | Status: AC
  Administered 2023-01-29: 2 mg via INTRAMUSCULAR
  Filled 2023-01-29: qty 1

## 2023-01-29 NOTE — ED Notes (Signed)
Pt transported to CT ?

## 2023-01-29 NOTE — ED Notes (Signed)
BP and pulse ox applied with continues redirection. Pt unwilling to let this tech put on heart monitor. Pt already removing pulse ox before leaving the room. High school friend at bedside.

## 2023-01-29 NOTE — ED Notes (Signed)
Pt increasingly agitated, swatting at staff trying to assist him in transfer in the bed. Visitor at bedside.This RN notified EDP Jearld Fenton MD.

## 2023-01-29 NOTE — ED Provider Notes (Incomplete)
[place] EMERGENCY DEPARTMENT AT [facility] Provider Note   CSN: [id number] Arrival date & time: 01/29/23  1711     History {Add pertinent medical, surgical, social history, OB history to HPI:1} Chief Complaint  Patient presents with  . Altered Mental Status  . Possible UTI    Omar Kennedy is a 77 y.o. male with HTN, CAD status post CABG x 4, OSA, HLD, recent fall from height presents with AMS, hematuria.  History provided by daughter at bedside.  Patient BIB [facility] from [facility]. EMS reported patient suppose to be there for 20 day, but he been there for a week. EMS reported patient had hematuria and his urine was dark and had a odor for 2 days. Patient had a traumatic fall and was seen in the emergency room and was discharge last Friday and been at the rehab ever since. . He broke his R Scapula, R clavicle and had a T11 fracture. Facility said ever since he had been altered, but daughter states that it acutely worsened on Tuesday.  He is normally ANO x 2 and currently is A&O x 0.  He cannot follow commands or answer any questions.     Vital signs: BP: 146/72, Pulse 70, Temp 97.6, RR 22, saturation 97% RA, Blood sugar 89.   Altered Mental Status      Home Medications Prior to Admission medications   Medication Sig Start Date End Date Taking? Authorizing Provider  acetaminophen (TYLENOL) 325 MG tablet Take 650 mg by mouth every 6 (six) hours as needed for mild pain.  06/21/17   [provider]  acetaminophen (TYLENOL) 500 MG tablet Take 1,000 mg by mouth every 6 (six) hours as needed for headache.    [provider]  ALPRAZolam (XANAX) 0.25 MG tablet TAKE 1 TABLET (0.25 MG TOTAL) BY MOUTH AT BEDTIME AS NEEDED FOR ANXIETY. 12/06/19   Dohmeier, Porfirio Mylar, MD  amLODipine (NORVASC) 5 MG tablet Take 5 mg by mouth daily. 12/22/22   [provider]  ascorbic acid (VITAMIN C) 500 MG tablet Take by mouth.    [provider]  aspirin  81 MG chewable tablet Chew 81 mg by mouth daily.    [provider]  aspirin EC 81 MG tablet Take 81 mg by mouth daily. Swallow whole.    [provider]  calcium carbonate (OS-CAL) 1250 (500 Ca) MG chewable tablet Chew 1 tablet by mouth daily.    [provider]  calcium-vitamin D (OSCAL WITH D) 500-200 MG-UNIT tablet Take 1 tablet by mouth.    [provider]  carvedilol (COREG) 12.5 MG tablet Take 6.25 mg by mouth daily. Verified takes 0.5mg  daily 12/09/17 12/28/19  [provider]  carvedilol (COREG) 12.5 MG tablet Take 6.25 mg by mouth daily. 07/29/22   [provider]  cefpodoxime (VANTIN) 200 MG tablet Take 1 tablet (200 mg total) by mouth 2 (two) times daily. 12/31/19   Meredeth Ide, MD  cetirizine (ZYRTEC) 10 MG tablet Take 10 mg by mouth daily.    [provider]  cholecalciferol (VITAMIN D3) 25 MCG (1000 UNIT) tablet Take 1,000 Units by mouth daily. gummies    [provider]  docusate sodium (COLACE) 100 MG capsule Take 1 capsule (100 mg total) by mouth 2 (two) times daily as needed for mild constipation. 01/22/23   Maczis, Elmer Sow, PA-C  escitalopram (LEXAPRO) 5 MG tablet Take 5 mg by mouth daily. 12/22/22   [provider]  ferrous sulfate 325 (65 FE) MG EC tablet Take 325 mg by mouth daily with breakfast.     [provider]  fluticasone (FLONASE) 50 MCG/ACT nasal spray Place into the nose. 04/02/17   [provider]  fluticasone (FLONASE) 50 MCG/ACT nasal spray Place 1 spray into both nostrils every evening.    [provider]  lidocaine (LIDODERM) 5 % Place 2 patches onto the skin daily. Remove & Discard patch within 12 hours or as directed by MD 01/22/23   Maczis, Elmer Sow, PA-C  lisinopril (ZESTRIL) 20 MG tablet Take 20 mg by mouth daily. 06/29/19   [provider]  meclizine (ANTIVERT) 12.5 MG tablet Take 1 tablet (12.5 mg total) by mouth 3 (three) times daily as needed  for dizziness. 12/30/19   Meredeth Ide, MD  megestrol (MEGACE) 400 MG/10ML suspension Take 10 mLs (400 mg total) by mouth daily. 01/23/23   Maczis, Elmer Sow, PA-C  Melatonin 10 MG TABS Take by mouth. Takes 3 mg tab at night    [provider]  mirtazapine (REMERON) 15 MG tablet TAKE 1 TABLET BY MOUTH EVERYDAY AT BEDTIME 01/17/20   Van Clines, MD  Multiple Vitamin (MULTIVITAMIN WITH MINERALS) TABS tablet Take 1 tablet by mouth daily.    [provider]  Multiple Vitamins-Minerals (HAIR SKIN AND NAILS FORMULA PO) Take 1 tablet by mouth daily.    [provider]  pantoprazole (PROTONIX) 40 MG tablet Take 40 mg by mouth at bedtime. 12/29/22   [provider]  polyethylene glycol (MIRALAX / GLYCOLAX) 17 g packet Place 17 g into feeding tube 2 (two) times daily as needed. 01/22/23   Maczis, Elmer Sow, PA-C  QUEtiapine (SEROQUEL) 25 MG tablet Take 1 tablet (25 mg total) by mouth at bedtime. 01/22/23   Maczis, Elmer Sow, PA-C  rivastigmine (EXELON) 1.5 MG capsule Take 1 capsule (1.5 mg total) by mouth 2 (two) times daily. 01/04/20   Van Clines, MD  rivastigmine (EXELON) 3 MG capsule Take 3 mg by mouth 2 (two) times daily. 11/30/22   [provider]  rosuvastatin (CRESTOR) 40 MG tablet Take 40 mg by mouth at bedtime.  07/23/17   [provider]  rosuvastatin (CRESTOR) 40 MG tablet Take 40 mg by mouth at bedtime. 10/13/22   [provider]  senna (SENOKOT) 8.6 MG TABS tablet Take 2 tablets (17.2 mg total) by mouth at bedtime. 01/22/23   Maczis, Elmer Sow, PA-C  sildenafil (VIAGRA) 100 MG tablet Take 100 mg by mouth daily as needed for erectile dysfunction.  02/07/19   [provider]  vitamin B-12 (CYANOCOBALAMIN) 100 MCG tablet Take 100 mcg by mouth daily. gummies    [provider]  vitamin B-12 (CYANOCOBALAMIN) 1000 MCG tablet Take 1,000 mcg by mouth daily.    [provider]      Allergies    Tetracycline,  Amoxicillin-pot clavulanate, Cephalexin, and Wasp venom    Review of Systems   Review of Systems A 10 point review of systems was performed and is negative unless otherwise reported in HPI.  Physical Exam Updated Vital Signs BP (!) 151/70 (BP Location: Left Arm)   Pulse 69   Temp 97.8 F (36.6 C) (Axillary)   Resp 18   Ht 5\' 10"  (1.778 m)   Wt 77.1 kg   SpO2 99%   BMI 24.39 kg/m  Physical Exam General: Normal appearing {Desc; male/male:11659}, lying in bed.  HEENT: PERRLA, Sclera anicteric, MMM, trachea midline.  Cardiology: RRR, no murmurs/rubs/gallops. BL radial and DP pulses equal bilaterally.  Resp: Normal respiratory rate and effort. CTAB, no wheezes, rhonchi, crackles.  Abd: Soft, non-tender, non-distended. No rebound tenderness or guarding.  GU: Deferred. MSK: No peripheral edema or signs of trauma. Extremities without deformity or TTP. No cyanosis or clubbing. Skin: warm, dry. Neuro: A&Ox4, CNs II-XII grossly intact. MAEs. Sensation grossly intact.  Psych: Normal mood and affect.   ED Results / Procedures / Treatments   Labs (all labs ordered are listed, but only abnormal results are displayed) Labs Reviewed  CBC WITH DIFFERENTIAL/PLATELET  COMPREHENSIVE METABOLIC PANEL  URINALYSIS, W/ REFLEX TO CULTURE (INFECTION SUSPECTED)  MAGNESIUM  RAPID URINE DRUG SCREEN, HOSP PERFORMED  ETHANOL  CBG MONITORING, ED    EKG None  Radiology DG Chest 2 View  Result Date: 01/29/2023 CLINICAL DATA:  Altered mental status EXAM: CHEST - 2 VIEW COMPARISON:  Chest CT 10/23/2021, 12/28/2019, 01/08/2023 FINDINGS: Post sternotomy changes. Multiple displaced right-sided rib fractures. No visible pneumothorax on this exam. Small volume right pleural effusion. Left lung clear. Stable cardiomediastinal silhouette allowing for rotation. Aortic atherosclerosis. Right medial clavicular head and right superior scapular fractures are again noted. Mild wedging of T12 similar compared to  prior IMPRESSION: 1. Multiple displaced right-sided rib fractures as seen on prior exam with small volume right pleural effusion. No visible pneumothorax. 2. Right clavicular head and right superior scapular fractures again noted. Electronically Signed   By: Jasmine Pang M.D.   On: 01/29/2023 19:19    Procedures Procedures  {Document cardiac monitor, telemetry assessment procedure when appropriate:1}  Medications Ordered in ED Medications - No data to display  ED Course/ Medical Decision Making/ A&P                          Medical Decision Making Amount and/or Complexity of Data Reviewed Labs: ordered. Decision-making details documented in ED Course. Radiology: ordered.    This patient presents to the ED for concern of ***, this involves an extensive number of treatment options, and is a complaint that carries with it a high risk of complications and morbidity.  I considered the following differential and admission for this acute, potentially life threatening condition.   MDM:    ***  Clinical Course as of 01/29/23 1959  Fri Jan 29, 2023  1958 Glucose-Capillary: 67 [HN]  1959 CXR shows known multiple R sided rib fractures, small R pleural effusion, R clavicular head fx.  [HN]    Clinical Course User Index [HN] Loetta Rough, MD    Labs: I Ordered, and personally interpreted labs.  The pertinent results include:  ***  Imaging Studies ordered: I ordered imaging studies including *** I independently visualized and interpreted imaging. I agree with the radiologist interpretation  Additional history obtained from ***.  External records from outside source obtained and reviewed including ***  Cardiac Monitoring: .The patient was maintained on a cardiac monitor.  I personally viewed and interpreted the cardiac monitored which showed an underlying rhythm of: ***  Reevaluation: After the interventions noted above, I reevaluated the patient and found that they have  :{resolved/improved/worsened:23923::"improved"}  Social Determinants of Health: .***  Disposition:  ***  Co morbidities that complicate the patient evaluation . Past Medical History:  Diagnosis Date  . Arthritis   . Coronary artery disease involving native coronary artery of native heart 05/27/2017  . Dehydration   . Deviated septum   . HTN (hypertension) 08/12/2017  .  Hypercholesteremia   . Hypercholesterolemia 12/10/2015  . Hypertension   . 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/2015  . Nasal turbinate hypertrophy   . Obstructive sleep apnea 06/2018   Not on CPAP until after sinus surgery; positional therapy not helpful  . 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 Vertigo      Medicines No orders of the defined types were placed in this encounter.   I have reviewed the patients home medicines and have made adjustments as needed  Problem List / ED Course: Problem List Items Addressed This Visit   None        {Document critical care time when appropriate:1} {Document review of labs and clinical decision tools ie heart score, Chads2Vasc2 etc:1}  {Document your independent review of radiology images, and any outside records:1} {Document your discussion with family members, caretakers, and with consultants:1} {Document social determinants of health affecting pt's care:1} {Document your decision making why or why not admission, treatments were needed:1}  This note was created using dictation software, which may contain spelling or grammatical errors.

## 2023-01-29 NOTE — ED Notes (Addendum)
Pt pulled out IV; unable to give ordered IV med. This RN notified EDP of pt escalating behavior and IV removal . Pt now allowing this RN to clean up blood from IV removal.

## 2023-01-29 NOTE — ED Notes (Signed)
Neighbor at bedside helping to keep patient calm

## 2023-01-29 NOTE — ED Triage Notes (Signed)
Patient BIB [facility] from [facility]. EMS reported patient suppose to be there for 20 day, but he been there for a week. EMS reported patient had hematuria and his urine was dark and had a odor for 2 days. Patient had a traumatic fall and was seen in the emergency room and was discharge last Friday and been at the rehab ever since. He had fell at home  10 foot side of the  with no water. He broke his Scapula, clavicle and had a T11 fracture. Facility said ever since he had been altered. Family reported patient normally A & 0 X 2. EMS said he was very altered for them. Vital signs: BP: 146/72, Pulse 70, Temp 97.6, RR 22, saturation 97% RA, Blood sugar 89.

## 2023-01-29 NOTE — ED Notes (Signed)
Unable to obtain full set of VS, pt not cooperative

## 2023-01-29 NOTE — ED Notes (Signed)
This RN notified by CT Tech that pt is not cooperative with exam, pt is to agitated, not able to complete exam, This RN notified Jearld Fenton MD of above

## 2023-01-29 NOTE — ED Provider Notes (Signed)
[place] EMERGENCY DEPARTMENT AT [facility] Provider Note   CSN: [id number] Arrival date & time: 01/29/23  1711     History {Add pertinent medical, surgical, social history, OB history to HPI:1} Chief Complaint  Patient presents with   Altered Mental Status   Possible UTI    Omar Kennedy is a 77 y.o. male with HTN, CAD status post CABG x 4, OSA, HLD, recent fall from height presents with AMS, hematuria.  History provided by daughter at bedside.  Patient BIB [facility] from [facility]. EMS reported patient suppose to be there for 20 day, but he been there for a week. EMS reported patient had hematuria and his urine was dark and had a odor for 2 days. Patient had a traumatic fall and was seen in the emergency room and was discharge last Friday and been at the rehab ever since. . He broke his R Scapula, R clavicle and had a T11 fracture. Facility said ever since he had been altered, but daughter states that it acutely worsened on Tuesday.  He is normally ANO x 2 and currently is A&O x 0.  He cannot follow commands or answer any questions.     Vital signs: BP: 146/72, Pulse 70, Temp 97.6, RR 22, saturation 97% RA, Blood sugar 89.   Altered Mental Status      Home Medications Prior to Admission medications   Medication Sig Start Date End Date Taking? Authorizing Provider  acetaminophen (TYLENOL) 325 MG tablet Take 650 mg by mouth every 6 (six) hours as needed for mild pain.  06/21/17   [provider]  acetaminophen (TYLENOL) 500 MG tablet Take 1,000 mg by mouth every 6 (six) hours as needed for headache.    [provider]  ALPRAZolam (XANAX) 0.25 MG tablet TAKE 1 TABLET (0.25 MG TOTAL) BY MOUTH AT BEDTIME AS NEEDED FOR ANXIETY. 12/06/19   Dohmeier, Porfirio Mylar, MD  amLODipine (NORVASC) 5 MG tablet Take 5 mg by mouth daily. 12/22/22   [provider]  ascorbic acid (VITAMIN C) 500 MG tablet Take by mouth.    [provider]  aspirin  81 MG chewable tablet Chew 81 mg by mouth daily.    [provider]  aspirin EC 81 MG tablet Take 81 mg by mouth daily. Swallow whole.    [provider]  calcium carbonate (OS-CAL) 1250 (500 Ca) MG chewable tablet Chew 1 tablet by mouth daily.    [provider]  calcium-vitamin D (OSCAL WITH D) 500-200 MG-UNIT tablet Take 1 tablet by mouth.    [provider]  carvedilol (COREG) 12.5 MG tablet Take 6.25 mg by mouth daily. Verified takes 0.5mg  daily 12/09/17 12/28/19  [provider]  carvedilol (COREG) 12.5 MG tablet Take 6.25 mg by mouth daily. 07/29/22   [provider]  cefpodoxime (VANTIN) 200 MG tablet Take 1 tablet (200 mg total) by mouth 2 (two) times daily. 12/31/19   Meredeth Ide, MD  cetirizine (ZYRTEC) 10 MG tablet Take 10 mg by mouth daily.    [provider]  cholecalciferol (VITAMIN D3) 25 MCG (1000 UNIT) tablet Take 1,000 Units by mouth daily. gummies    [provider]  docusate sodium (COLACE) 100 MG capsule Take 1 capsule (100 mg total) by mouth 2 (two) times daily as needed for mild constipation. 01/22/23   Maczis, Elmer Sow, PA-C  escitalopram (LEXAPRO) 5 MG tablet Take 5 mg by mouth daily. 12/22/22   [provider]  ferrous sulfate 325 (65 FE) MG EC tablet Take 325 mg by mouth daily with breakfast.     [provider]  fluticasone (FLONASE) 50 MCG/ACT nasal spray Place into the nose. 04/02/17   [provider]  fluticasone (FLONASE) 50 MCG/ACT nasal spray Place 1 spray into both nostrils every evening.    [provider]  lidocaine (LIDODERM) 5 % Place 2 patches onto the skin daily. Remove & Discard patch within 12 hours or as directed by MD 01/22/23   Maczis, Elmer Sow, PA-C  lisinopril (ZESTRIL) 20 MG tablet Take 20 mg by mouth daily. 06/29/19   [provider]  meclizine (ANTIVERT) 12.5 MG tablet Take 1 tablet (12.5 mg total) by mouth 3 (three) times daily as needed  for dizziness. 12/30/19   Meredeth Ide, MD  megestrol (MEGACE) 400 MG/10ML suspension Take 10 mLs (400 mg total) by mouth daily. 01/23/23   Maczis, Elmer Sow, PA-C  Melatonin 10 MG TABS Take by mouth. Takes 3 mg tab at night    [provider]  mirtazapine (REMERON) 15 MG tablet TAKE 1 TABLET BY MOUTH EVERYDAY AT BEDTIME 01/17/20   Van Clines, MD  Multiple Vitamin (MULTIVITAMIN WITH MINERALS) TABS tablet Take 1 tablet by mouth daily.    [provider]  Multiple Vitamins-Minerals (HAIR SKIN AND NAILS FORMULA PO) Take 1 tablet by mouth daily.    [provider]  pantoprazole (PROTONIX) 40 MG tablet Take 40 mg by mouth at bedtime. 12/29/22   [provider]  polyethylene glycol (MIRALAX / GLYCOLAX) 17 g packet Place 17 g into feeding tube 2 (two) times daily as needed. 01/22/23   Maczis, Elmer Sow, PA-C  QUEtiapine (SEROQUEL) 25 MG tablet Take 1 tablet (25 mg total) by mouth at bedtime. 01/22/23   Maczis, Elmer Sow, PA-C  rivastigmine (EXELON) 1.5 MG capsule Take 1 capsule (1.5 mg total) by mouth 2 (two) times daily. 01/04/20   Van Clines, MD  rivastigmine (EXELON) 3 MG capsule Take 3 mg by mouth 2 (two) times daily. 11/30/22   [provider]  rosuvastatin (CRESTOR) 40 MG tablet Take 40 mg by mouth at bedtime.  07/23/17   [provider]  rosuvastatin (CRESTOR) 40 MG tablet Take 40 mg by mouth at bedtime. 10/13/22   [provider]  senna (SENOKOT) 8.6 MG TABS tablet Take 2 tablets (17.2 mg total) by mouth at bedtime. 01/22/23   Maczis, Elmer Sow, PA-C  sildenafil (VIAGRA) 100 MG tablet Take 100 mg by mouth daily as needed for erectile dysfunction.  02/07/19   [provider]  vitamin B-12 (CYANOCOBALAMIN) 100 MCG tablet Take 100 mcg by mouth daily. gummies    [provider]  vitamin B-12 (CYANOCOBALAMIN) 1000 MCG tablet Take 1,000 mcg by mouth daily.    [provider]      Allergies    Tetracycline,  Amoxicillin-pot clavulanate, Cephalexin, and Wasp venom    Review of Systems   Review of Systems A 10 point review of systems was performed and is negative unless otherwise reported in HPI.  Physical Exam Updated Vital Signs BP (!) 151/70 (BP Location: Left Arm)   Pulse 69   Temp 97.8 F (36.6 C) (Axillary)   Resp 18   Ht 5\' 10"  (1.778 m)   Wt 77.1 kg   SpO2 99%   BMI 24.39 kg/m  Physical Exam General: Normal appearing {Desc; male/male:11659}, lying in bed.  HEENT: PERRLA, Sclera anicteric, MMM, trachea midline.  Cardiology: RRR, no murmurs/rubs/gallops. BL radial and DP pulses equal bilaterally.  Resp: Normal respiratory rate and effort. CTAB, no wheezes, rhonchi, crackles.  Abd: Soft, non-tender, non-distended. No rebound tenderness or guarding.  GU: Deferred. MSK: No peripheral edema or signs of trauma. Extremities without deformity or TTP. No cyanosis or clubbing. Skin: warm, dry. No rashes or lesions. Back: No CVA tenderness Neuro: A&Ox4, CNs II-XII grossly intact. MAEs. Sensation grossly intact.  Psych: Normal mood and affect.   ED Results / Procedures / Treatments   Labs (all labs ordered are listed, but only abnormal results are displayed) Labs Reviewed  CBC WITH DIFFERENTIAL/PLATELET  COMPREHENSIVE METABOLIC PANEL  URINALYSIS, W/ REFLEX TO CULTURE (INFECTION SUSPECTED)  MAGNESIUM  RAPID URINE DRUG SCREEN, HOSP PERFORMED  ETHANOL  CBG MONITORING, ED    EKG None  Radiology DG Chest 2 View  Result Date: 01/29/2023 CLINICAL DATA:  Altered mental status EXAM: CHEST - 2 VIEW COMPARISON:  Chest CT 10/23/2021, 12/28/2019, 01/08/2023 FINDINGS: Post sternotomy changes. Multiple displaced right-sided rib fractures. No visible pneumothorax on this exam. Small volume right pleural effusion. Left lung clear. Stable cardiomediastinal silhouette allowing for rotation. Aortic atherosclerosis. Right medial clavicular head and right superior scapular fractures are again  noted. Mild wedging of T12 similar compared to prior IMPRESSION: 1. Multiple displaced right-sided rib fractures as seen on prior exam with small volume right pleural effusion. No visible pneumothorax. 2. Right clavicular head and right superior scapular fractures again noted. Electronically Signed   By: Jasmine Pang M.D.   On: 01/29/2023 19:19    Procedures Procedures  {Document cardiac monitor, telemetry assessment procedure when appropriate:1}  Medications Ordered in ED Medications - No data to display  ED Course/ Medical Decision Making/ A&P                          Medical Decision Making Amount and/or Complexity of Data Reviewed Labs: ordered. Radiology: ordered.    This patient presents to the ED for concern of ***, this involves an extensive number of treatment options, and is a complaint that carries with it a high risk of complications and morbidity.  I considered the following differential and admission for this acute, potentially life threatening condition.   MDM:    ***  Clinical Course as of 01/29/23 1959  Fri Jan 29, 2023  1958 Glucose-Capillary: 21 [HN]  1959 CXR shows known multiple R sided rib fractures, small R pleural effusion, R clavicular head fx.  [HN]    Clinical Course User Index [HN] Loetta Rough, MD    Labs: I Ordered, and personally interpreted labs.  The pertinent results include:  ***  Imaging Studies ordered: I ordered imaging studies including *** I independently visualized and interpreted imaging. I agree with the radiologist interpretation  Additional history obtained from ***.  External records from outside source obtained and reviewed including ***  Cardiac Monitoring: The patient was maintained on a cardiac monitor.  I personally viewed and interpreted the cardiac monitored which showed an underlying rhythm of: ***  Reevaluation: After the interventions noted above, I reevaluated the patient and found that they have  :{resolved/improved/worsened:23923::"improved"}  Social Determinants of Health: ***  Disposition:  ***  Co morbidities that complicate the patient evaluation  Past Medical History:  Diagnosis Date   Arthritis    Coronary artery disease involving native coronary artery of native heart 05/27/2017   Dehydration    Deviated septum    HTN (hypertension) 08/12/2017  Hypercholesteremia    Hypercholesterolemia 12/10/2015   Hypertension    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   I have reviewed the patients home medicines and have made adjustments as needed  Problem List / ED Course: Problem List Items Addressed This Visit   None        {Document critical care time when appropriate:1} {Document review of labs and clinical decision tools ie heart score, Chads2Vasc2 etc:1}  {Document your independent review of radiology images, and any outside records:1} {Document your discussion with family members, caretakers, and with consultants:1} {Document social determinants of health affecting pt's care:1} {Document your decision making why or why not admission, treatments were needed:1}  This note was created using dictation software, which may contain spelling or grammatical errors.

## 2023-01-30 ENCOUNTER — Encounter (HOSPITAL_COMMUNITY): Payer: Self-pay | Admitting: Internal Medicine

## 2023-01-30 ENCOUNTER — Emergency Department (HOSPITAL_COMMUNITY): Payer: Medicare PPO

## 2023-01-30 DIAGNOSIS — S42001D Fracture of unspecified part of right clavicle, subsequent encounter for fracture with routine healing: Secondary | ICD-10-CM | POA: Diagnosis not present

## 2023-01-30 DIAGNOSIS — Z881 Allergy status to other antibiotic agents status: Secondary | ICD-10-CM | POA: Diagnosis not present

## 2023-01-30 DIAGNOSIS — W1789XD Other fall from one level to another, subsequent encounter: Secondary | ICD-10-CM | POA: Diagnosis not present

## 2023-01-30 DIAGNOSIS — J90 Pleural effusion, not elsewhere classified: Secondary | ICD-10-CM | POA: Diagnosis present

## 2023-01-30 DIAGNOSIS — R52 Pain, unspecified: Secondary | ICD-10-CM | POA: Diagnosis not present

## 2023-01-30 DIAGNOSIS — Z66 Do not resuscitate: Secondary | ICD-10-CM | POA: Diagnosis present

## 2023-01-30 DIAGNOSIS — R4182 Altered mental status, unspecified: Secondary | ICD-10-CM | POA: Insufficient documentation

## 2023-01-30 DIAGNOSIS — F03918 Unspecified dementia, unspecified severity, with other behavioral disturbance: Secondary | ICD-10-CM | POA: Diagnosis present

## 2023-01-30 DIAGNOSIS — Z7982 Long term (current) use of aspirin: Secondary | ICD-10-CM | POA: Diagnosis not present

## 2023-01-30 DIAGNOSIS — I10 Essential (primary) hypertension: Secondary | ICD-10-CM | POA: Diagnosis present

## 2023-01-30 DIAGNOSIS — S2241XA Multiple fractures of ribs, right side, initial encounter for closed fracture: Secondary | ICD-10-CM | POA: Diagnosis present

## 2023-01-30 DIAGNOSIS — Z91038 Other insect allergy status: Secondary | ICD-10-CM | POA: Diagnosis not present

## 2023-01-30 DIAGNOSIS — Z79899 Other long term (current) drug therapy: Secondary | ICD-10-CM | POA: Diagnosis not present

## 2023-01-30 DIAGNOSIS — Z823 Family history of stroke: Secondary | ICD-10-CM | POA: Diagnosis not present

## 2023-01-30 DIAGNOSIS — I2510 Atherosclerotic heart disease of native coronary artery without angina pectoris: Secondary | ICD-10-CM | POA: Diagnosis present

## 2023-01-30 DIAGNOSIS — Z951 Presence of aortocoronary bypass graft: Secondary | ICD-10-CM | POA: Diagnosis not present

## 2023-01-30 DIAGNOSIS — Z888 Allergy status to other drugs, medicaments and biological substances status: Secondary | ICD-10-CM | POA: Diagnosis not present

## 2023-01-30 DIAGNOSIS — G4733 Obstructive sleep apnea (adult) (pediatric): Secondary | ICD-10-CM | POA: Diagnosis present

## 2023-01-30 DIAGNOSIS — Z87891 Personal history of nicotine dependence: Secondary | ICD-10-CM | POA: Diagnosis not present

## 2023-01-30 DIAGNOSIS — E782 Mixed hyperlipidemia: Secondary | ICD-10-CM | POA: Diagnosis present

## 2023-01-30 DIAGNOSIS — R451 Restlessness and agitation: Secondary | ICD-10-CM | POA: Diagnosis not present

## 2023-01-30 DIAGNOSIS — Z880 Allergy status to penicillin: Secondary | ICD-10-CM | POA: Diagnosis not present

## 2023-01-30 DIAGNOSIS — Z515 Encounter for palliative care: Secondary | ICD-10-CM | POA: Diagnosis not present

## 2023-01-30 DIAGNOSIS — G9341 Metabolic encephalopathy: Secondary | ICD-10-CM | POA: Diagnosis present

## 2023-01-30 DIAGNOSIS — R531 Weakness: Secondary | ICD-10-CM | POA: Diagnosis not present

## 2023-01-30 LAB — URINALYSIS, W/ REFLEX TO CULTURE (INFECTION SUSPECTED)
Bacteria, UA: NONE SEEN
Bilirubin Urine: NEGATIVE
Glucose, UA: NEGATIVE mg/dL
Hgb urine dipstick: NEGATIVE
Ketones, ur: NEGATIVE mg/dL
Leukocytes,Ua: NEGATIVE
Nitrite: NEGATIVE
Protein, ur: NEGATIVE mg/dL
Specific Gravity, Urine: 1.04 — ABNORMAL HIGH (ref 1.005–1.030)
pH: 5 (ref 5.0–8.0)

## 2023-01-30 LAB — RAPID URINE DRUG SCREEN, HOSP PERFORMED
Amphetamines: NOT DETECTED
Amphetamines: NOT DETECTED
Barbiturates: NOT DETECTED
Barbiturates: NOT DETECTED
Benzodiazepines: NOT DETECTED
Benzodiazepines: POSITIVE — AB
Cocaine: NOT DETECTED
Cocaine: NOT DETECTED
Opiates: NOT DETECTED
Opiates: NOT DETECTED
Tetrahydrocannabinol: POSITIVE — AB
Tetrahydrocannabinol: POSITIVE — AB

## 2023-01-30 LAB — URINALYSIS, ROUTINE W REFLEX MICROSCOPIC
Bacteria, UA: NONE SEEN
Bilirubin Urine: NEGATIVE
Glucose, UA: NEGATIVE mg/dL
Ketones, ur: 5 mg/dL — AB
Leukocytes,Ua: NEGATIVE
Nitrite: NEGATIVE
Protein, ur: 30 mg/dL — AB
Specific Gravity, Urine: >1.046 — ABNORMAL HIGH (ref 1.005–1.030)
pH: 5 (ref 5.0–8.0)

## 2023-01-30 MED ORDER — ONDANSETRON HCL 4 MG PO TABS: 4.0000 mg | ORAL_TABLET | Freq: Four times a day (QID) | ORAL | Status: DC | PRN

## 2023-01-30 MED ORDER — FLUTICASONE PROPIONATE 50 MCG/ACT NA SUSP
1.0000 | Freq: Every evening | NASAL | Status: DC
  Administered 2023-02-02 – 2023-02-06 (×4): 1 via NASAL
  Filled 2023-01-30: qty 16

## 2023-01-30 MED ORDER — ACETAMINOPHEN 325 MG PO TABS: 650.0000 mg | ORAL_TABLET | Freq: Four times a day (QID) | ORAL | Status: DC | PRN

## 2023-01-30 MED ORDER — ACETAMINOPHEN 650 MG RE SUPP: 650.0000 mg | Freq: Four times a day (QID) | RECTAL | Status: DC | PRN

## 2023-01-30 MED ORDER — BISACODYL 5 MG PO TBEC: 5.0000 mg | DELAYED_RELEASE_TABLET | Freq: Every day | ORAL | Status: DC | PRN

## 2023-01-30 MED ORDER — PANTOPRAZOLE SODIUM 40 MG PO TBEC
40.0000 mg | DELAYED_RELEASE_TABLET | Freq: Every day | ORAL | Status: DC
  Administered 2023-02-01 – 2023-02-06 (×6): 40 mg via ORAL
  Filled 2023-01-30 (×7): qty 1

## 2023-01-30 MED ORDER — AMLODIPINE BESYLATE 5 MG PO TABS
5.0000 mg | ORAL_TABLET | Freq: Every day | ORAL | Status: DC
  Administered 2023-01-30 – 2023-02-07 (×8): 5 mg via ORAL
  Filled 2023-01-30 (×9): qty 1

## 2023-01-30 MED ORDER — LIDOCAINE 5 % EX PTCH
2.0000 | MEDICATED_PATCH | CUTANEOUS | Status: DC
  Administered 2023-01-30 – 2023-02-07 (×7): 2 via TRANSDERMAL
  Filled 2023-01-30 (×10): qty 2

## 2023-01-30 MED ORDER — LORATADINE 10 MG PO TABS
10.0000 mg | ORAL_TABLET | Freq: Every day | ORAL | Status: DC
  Administered 2023-02-01 – 2023-02-07 (×6): 10 mg via ORAL
  Filled 2023-01-30 (×7): qty 1

## 2023-01-30 MED ORDER — ROSUVASTATIN CALCIUM 20 MG PO TABS
40.0000 mg | ORAL_TABLET | Freq: Every day | ORAL | Status: DC
  Administered 2023-01-30 – 2023-02-06 (×8): 40 mg via ORAL
  Filled 2023-01-30 (×8): qty 2

## 2023-01-30 MED ORDER — POLYETHYLENE GLYCOL 3350 17 G PO PACK: 17.0000 g | PACK | Freq: Every day | ORAL | Status: DC | PRN

## 2023-01-30 MED ORDER — MIRTAZAPINE 15 MG PO TABS: 15.0000 mg | ORAL_TABLET | Freq: Every day | ORAL | Status: DC

## 2023-01-30 MED ORDER — ESCITALOPRAM OXALATE 10 MG PO TABS
5.0000 mg | ORAL_TABLET | Freq: Every day | ORAL | Status: DC
  Administered 2023-01-30 – 2023-02-07 (×8): 5 mg via ORAL
  Filled 2023-01-30 (×9): qty 1

## 2023-01-30 MED ORDER — IOHEXOL 350 MG/ML SOLN
75.0000 mL | Freq: Once | INTRAVENOUS | Status: AC | PRN
  Administered 2023-01-30: 75 mL via INTRAVENOUS

## 2023-01-30 MED ORDER — SODIUM CHLORIDE 0.9 % IV SOLN: 2.0000 g | Freq: Once | INTRAVENOUS | Status: DC

## 2023-01-30 MED ORDER — LACTATED RINGERS IV SOLN: INTRAVENOUS | Status: DC

## 2023-01-30 MED ORDER — RIVASTIGMINE TARTRATE 1.5 MG PO CAPS
3.0000 mg | ORAL_CAPSULE | Freq: Two times a day (BID) | ORAL | Status: DC
  Administered 2023-01-30 – 2023-02-07 (×14): 3 mg via ORAL
  Filled 2023-01-30 (×20): qty 2

## 2023-01-30 MED ORDER — HYDRALAZINE HCL 20 MG/ML IJ SOLN: 5.0000 mg | INTRAMUSCULAR | Status: DC | PRN

## 2023-01-30 MED ORDER — CARVEDILOL 6.25 MG PO TABS
6.2500 mg | ORAL_TABLET | Freq: Every day | ORAL | Status: DC
  Administered 2023-01-30 – 2023-02-07 (×8): 6.25 mg via ORAL
  Filled 2023-01-30 (×9): qty 1

## 2023-01-30 MED ORDER — MORPHINE SULFATE (PF) 2 MG/ML IV SOLN: 2.0000 mg | INTRAVENOUS | Status: DC | PRN

## 2023-01-30 MED ORDER — OXYCODONE HCL 5 MG PO TABS
5.0000 mg | ORAL_TABLET | ORAL | Status: DC | PRN
  Administered 2023-02-04 – 2023-02-05 (×2): 5 mg via ORAL
  Filled 2023-01-30 (×2): qty 1

## 2023-01-30 MED ORDER — HALOPERIDOL LACTATE 5 MG/ML IJ SOLN
2.0000 mg | Freq: Four times a day (QID) | INTRAMUSCULAR | Status: DC | PRN
  Administered 2023-01-30: 5 mg via INTRAVENOUS
  Administered 2023-01-30: 2 mg via INTRAVENOUS
  Administered 2023-01-31 (×2): 3 mg via INTRAVENOUS
  Administered 2023-02-01: 5 mg via INTRAVENOUS
  Filled 2023-01-30 (×5): qty 1

## 2023-01-30 MED ORDER — QUETIAPINE FUMARATE 25 MG PO TABS
25.0000 mg | ORAL_TABLET | Freq: Every day | ORAL | Status: DC
  Administered 2023-01-30: 25 mg via ORAL
  Filled 2023-01-30: qty 1

## 2023-01-30 MED ORDER — DOCUSATE SODIUM 100 MG PO CAPS
100.0000 mg | ORAL_CAPSULE | Freq: Two times a day (BID) | ORAL | Status: DC
  Administered 2023-02-01 – 2023-02-06 (×6): 100 mg via ORAL
  Filled 2023-01-30 (×12): qty 1

## 2023-01-30 MED ORDER — SODIUM CHLORIDE 0.9% FLUSH
3.0000 mL | Freq: Two times a day (BID) | INTRAVENOUS | Status: DC
  Administered 2023-01-30 – 2023-02-01 (×4): 3 mL via INTRAVENOUS

## 2023-01-30 MED ORDER — ONDANSETRON HCL 4 MG/2ML IJ SOLN: 4.0000 mg | Freq: Four times a day (QID) | INTRAMUSCULAR | Status: DC | PRN

## 2023-01-30 MED ORDER — SENNA 8.6 MG PO TABS
2.0000 | ORAL_TABLET | Freq: Two times a day (BID) | ORAL | Status: DC
  Administered 2023-02-01 – 2023-02-07 (×11): 17.2 mg via ORAL
  Filled 2023-01-30 (×14): qty 2

## 2023-01-30 MED ORDER — MEGESTROL ACETATE 400 MG/10ML PO SUSP
400.0000 mg | Freq: Every day | ORAL | Status: DC
  Administered 2023-01-30 – 2023-02-06 (×7): 400 mg via ORAL
  Filled 2023-01-30 (×8): qty 10
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
## 2023-01-30 NOTE — Consult Note (Signed)
Neurosurgery Consultation  Reason for Consult: Concern for NPH Referring Physician: Ophelia Charter  CC: AMS  HPI: This is a 77 y.o. 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 The patient is non-verbal on my exam and unable to give any further history. I reviewed his records extensively which show the above, evaluations by neurology 2 admissions ago had a neurologic exam that he was awake and interactive but asking questions that made no sense. By report, he has been progressively getting worse, but after his trauma he was dramatically worse and continues to be worse than his pre-injury baseline. No known history regarding incontinence and gait, unable to assess today obviously.    ROS: A 14 point ROS was performed and is negative except as noted in the HPI.   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 He has never used smokeless tobacco. He reports current alcohol use of about 7.0 standard drinks of alcohol per week. He reports that he does not use drugs.  Exam: Vital signs in last 24 hours: Temp:  [97.8 F (36.6 C)-98.2 F (36.8 C)] 98.2 F (36.8 C) (07/13 0734) Pulse Rate:  [69-92] 87 (07/13 0719) Resp:  [16-20] 16 (07/13 0734) BP: (118-162)/(55-95) 123/60 (07/13 0734) SpO2:  [94 %-100 %]  100 % (07/13 0719) Weight:  [77.1 kg] 77.1 kg (07/12 1805) General: Awake, alert, cooperative, lying in bed, appears chronically ill Head: Normocephalic and atruamatic HEENT: Head rotated preferentially to the right but appears volitional Pulmonary: breathing room air comfortably, no evidence of increased work of breathing, +mixed density protuberance over the sternoclavicular junction on the right, non-tender Cardiac: RRR Abdomen: S NT ND Extremities: Warm and well perfused x4, decreased mass, normal tone Neuro: Somnolent, some mumbling to stimulus but no reception / output for speech, PERRL, gaze conjugate, face grossly symmetric at rest but unable to test further, withdraws to pain x4 but doesn't follow commands    Assessment and Plan: 77 y.o. 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 CTH personally reviewed, which shows progressive increase in ventricular size compared to 6/18, preferentially in 1st-3rd ventricles. But if compared to an MRI 04/02/22 that was before his trauma, they were similar to the current size and quite large.   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 Clinically, I agree with prior neurology assessments that this doesn't appear c/w typical NPH. Either way, I think his dementia is advanced enough that he would do poorly with surgery  and, given the low likelihood of NPH being the underlying cause of a patient with pretty typical AD progression, I would not recommend surgery.  -please call with any concerns or questions  Jadene Pierini, MD 01/30/23 11:37 AM [facility]

## 2023-01-30 NOTE — ED Notes (Signed)
Called 2W x2 and was able to notify of pt coming to their floor.

## 2023-01-30 NOTE — ED Provider Notes (Signed)
  Provider Note MRN:  [id number]  Arrival date & time: 01/30/23    ED Course and Medical Decision Making  Assumed care from Dr. Jearld Fenton at shift change.  Hematuria and increased confusion recently.  [facility]'s MD wants him admitted for UTI and IV antibiotics.  Awaiting CT imaging, urinalysis.  Patient was a bit agitated and required IV medications to calm down earlier.  Urinalysis without obvious infection.  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  Admitted to medicine for altered mental status.  Procedures  Final Clinical Impressions(s) / ED Diagnoses     ICD-10-CM   1. Altered mental status, unspecified altered mental status type  R41.82       ED Discharge Orders     None       Discharge Instructions   None     Elmer Sow. Pilar Plate, MD [facility] Emergency Medicine [facility] [email or web address]    Sabas Sous, MD 01/30/23 [phone]

## 2023-01-30 NOTE — ED Notes (Signed)
Pt's family member updated.

## 2023-01-30 NOTE — ED Notes (Signed)
Called 2W to notify of pt coming to floor x 1

## 2023-01-30 NOTE — ED Notes (Signed)
ED TO INPATIENT HANDOFF REPORT  ED Nurse Name and Phone #: [phone] Denton Ar RN  S Name/Age/Gender Omar Kennedy 77 y.o. male Room/Bed: 009C/009C  Code Status   Code Status: DNR  Home/SNF/Other Nursing Home [facility] Patient oriented to: self Is this baseline? No   Triage Complete: Triage complete  Chief Complaint AMS (altered mental status) [R41.82]  Triage Note Patient BIB [facility] from [facility]. EMS reported patient suppose to be there for 20 day, but he been there for a week. EMS reported patient had hematuria and his urine was dark and had a odor for 2 days. Patient had a traumatic fall and was seen in the emergency room and was discharge last Friday and been at the rehab ever since. He had fell at home  10 foot side of the  with no water. He broke his Scapula, clavicle and had a T11 fracture. Facility said ever since he had been altered. Family reported patient normally A & 0 X 2. EMS said he was very altered for them. Vital signs: BP: 146/72, Pulse 70, Temp 97.6, RR 22, saturation 97% RA, Blood sugar 89.   Allergies Allergies  Allergen Reactions   Tetracycline Other (See Comments)    VERTIGO   Amoxicillin-Pot Clavulanate Nausea Only   Cephalexin Other (See Comments)    vertigo   Wasp Venom Swelling    Level of Care/Admitting Diagnosis ED Disposition     ED Disposition  Admit   Condition  --   Comment  Hospital Area: [facility] [[id number]]  Level of Care: Telemetry Medical [104]  May admit patient to [facility] or [facility] if equivalent level of care is available:: No  Covid Evaluation: Asymptomatic - no recent exposure (last 10 days) testing not required  Diagnosis: AMS (altered mental status) [[id number]]  Admitting Physician: Darlin Drop [[id number]]  Attending Physician: Darlin Drop [[id number]]  Certification:: I certify this patient will need inpatient services for at least 2 midnights  Estimated Length of Stay: 2           B Medical/Surgery History 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/2015   Nasal turbinate hypertrophy    Obstructive sleep apnea 06/2018   Not on CPAP until after sinus surgery; positional therapy not helpful   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   Vertigo    Past Surgical History:  Procedure Laterality Date   CARDIAC SURGERY     CORONARY ARTERY BYPASS GRAFT  2018   ENDOSCOPIC CONCHA BULLOSA RESECTION Bilateral 08/02/2018   Procedure: ENDOSCOPIC BILATERAL CONCHA BULLOSA RESECTION;  Surgeon: Newman Pies, MD;  Location: [facility];  Service: ENT;  Laterality: Bilateral;   ETHMOIDECTOMY Bilateral 08/02/2018   Procedure: TOTAL ETHMOIDECTOMY AND SPHENOIDECTOMY;  Surgeon: Newman Pies, MD;  Location: [facility];  Service: ENT;  Laterality: Bilateral;   EXCISION MORTON'S NEUROMA Left    FRONTAL SINUS EXPLORATION Bilateral 08/02/2018   Procedure: ENDOSCOPIC BILATERAL FRONTAL RECESS EXPLORATION;  Surgeon: Newman Pies, MD;  Location: [facility];  Service: ENT;  Laterality: Bilateral;   HERNIA REPAIR     MAXILLARY ANTROSTOMY Bilateral 08/02/2018   Procedure: ENDOSCOPIC BILATERAL MAXILLARY ANTROSTOMY WITH TISSUE REMOVAL;  Surgeon: Newman Pies, MD;  Location: [facility];  Service: ENT;  Laterality: Bilateral;   NASAL SEPTOPLASTY W/ TURBINOPLASTY Bilateral 08/02/2018   Procedure: NASAL SEPTOPLASTY WITH BILATERAL TURBINATE REDUCTION;  Surgeon: Newman Pies, MD;  Location: [facility];  Service: ENT;  Laterality: Bilateral;   SINUS ENDO WITH FUSION Bilateral 08/02/2018   Procedure: SINUS ENDO WITH FUSION;  Surgeon: Newman Pies, MD;  Location: [facility];  Service: ENT;   Laterality: Bilateral;   umbilical surgery       A IV Location/Drains/Wounds Patient Lines/Drains/Airways Status     Active Line/Drains/Airways     Name Placement date Placement time Site Days   Peripheral IV 01/30/23 20 G Anterior;Distal;Left;Upper Arm 01/30/23  0011  Arm  less than 1   Wound / Incision (Open or Dehisced) 01/05/23 Laceration Face Right;Upper;Distal laceration right posterior head 01/05/23  0700  Face  25            Intake/Output Last 24 hours No intake or output data in the 24 hours ending 01/30/23 0742  Labs/Imaging Results for orders placed or performed during the hospital encounter of 01/29/23 (from the past 48 hour(s))  POC CBG, ED     Status: None   Collection Time: 01/29/23  7:52 PM  Result Value Ref Range   Glucose-Capillary 88 70 - 99 mg/dL    Comment: Glucose reference range applies only to samples taken after fasting for at least 8 hours.  CBC with Differential     Status: Abnormal   Collection Time: 01/29/23  7:58 PM  Result Value Ref Range   WBC 5.2 4.0 - 10.5 K/uL   RBC 4.10 (L) 4.22 - 5.81 MIL/uL   Hemoglobin 12.2 (L) 13.0 - 17.0 g/dL   HCT 16.1 (L) 09.6 - 04.5 %   MCV 92.9 80.0 - 100.0 fL   MCH 29.8 26.0 - 34.0 pg   MCHC 32.0 30.0 - 36.0 g/dL   RDW 40.9 (H) 81.1 - 91.4 %   Platelets 251 150 - 400 K/uL   nRBC 0.0 0.0 - 0.2 %   Neutrophils Relative % 42 %   Neutro Abs 2.2 1.7 - 7.7 K/uL   Lymphocytes Relative 44 %   Lymphs Abs 2.3 0.7 - 4.0 K/uL   Monocytes Relative 12 %   Monocytes Absolute 0.6 0.1 - 1.0 K/uL   Eosinophils Relative 1 %   Eosinophils Absolute 0.1 0.0 - 0.5 K/uL   Basophils Relative 1 %   Basophils Absolute 0.0 0.0 - 0.1 K/uL   Immature Granulocytes 0 %   Abs Immature Granulocytes 0.01 0.00 - 0.07 K/uL    Comment: Performed at [facility], [address], [place], [place] [address]  Comprehensive metabolic panel     Status: Abnormal   Collection Time: 01/29/23  7:58 PM  Result Value Ref Range   Sodium 137  135 - 145 mmol/L   Potassium 3.6 3.5 - 5.1 mmol/L   Chloride 104 98 - 111 mmol/L   CO2 21 (L) 22 - 32 mmol/L   Glucose, Bld 90 70 - 99 mg/dL    Comment: Glucose reference range applies only to samples taken after fasting for at least 8 hours.   BUN 11 8 - 23 mg/dL   Creatinine, Ser 6.21 0.61 - 1.24 mg/dL   Calcium 8.9 8.9 - 30.8 mg/dL   Total Protein 6.4 (L) 6.5 - 8.1 g/dL   Albumin 3.6 3.5 - 5.0 g/dL   AST 37 15 - 41 U/L  ALT 49 (H) 0 - 44 U/L   Alkaline Phosphatase 150 (H) 38 - 126 U/L   Total Bilirubin 2.5 (H) 0.3 - 1.2 mg/dL   GFR, Estimated >29 >56 mL/min    Comment: (NOTE) Calculated using the CKD-EPI Creatinine Equation (2021)    Anion gap 12 5 - 15    Comment: Performed at [facility], [address] [place], [place] [address]  Magnesium     Status: None   Collection Time: 01/29/23  7:58 PM  Result Value Ref Range   Magnesium 1.9 1.7 - 2.4 mg/dL    Comment: Performed at [facility], [address], [place], [place] [address]  Ethanol     Status: None   Collection Time: 01/29/23  7:58 PM  Result Value Ref Range   Alcohol, Ethyl (B) <10 <10 mg/dL    Comment: (NOTE) Lowest detectable limit for serum alcohol is 10 mg/dL.  For medical purposes only. Performed at [facility], [address] [place], [place] [address]   Urinalysis, w/ Reflex to Culture (Infection Suspected) -Urine, Clean Catch     Status: Abnormal   Collection Time: 01/30/23  1:10 AM  Result Value Ref Range   Specimen Source URINE, CLEAN CATCH    Color, Urine AMBER (A) YELLOW    Comment: BIOCHEMICALS MAY BE AFFECTED BY COLOR   APPearance CLEAR CLEAR   Specific Gravity, Urine 1.040 (H) 1.005 - 1.030   pH 5.0 5.0 - 8.0   Glucose, UA NEGATIVE NEGATIVE mg/dL   Hgb urine dipstick NEGATIVE NEGATIVE   Bilirubin Urine NEGATIVE NEGATIVE   Ketones, ur NEGATIVE NEGATIVE mg/dL   Protein, ur NEGATIVE NEGATIVE mg/dL   Nitrite NEGATIVE NEGATIVE   Leukocytes,Ua NEGATIVE NEGATIVE   RBC /  HPF 0-5 0 - 5 RBC/hpf   WBC, UA 0-5 0 - 5 WBC/hpf    Comment:        Reflex urine culture not performed if WBC <=10, OR if Squamous epithelial cells >5. If Squamous epithelial cells >5 suggest recollection.    Bacteria, UA NONE SEEN NONE SEEN   Squamous Epithelial / HPF 0-5 0 - 5 /HPF   Mucus PRESENT     Comment: Performed at [facility], [address], [place], [place] [address]  Rapid urine drug screen (hospital performed)     Status: Abnormal   Collection Time: 01/30/23  1:10 AM  Result Value Ref Range   Opiates NONE DETECTED NONE DETECTED   Cocaine NONE DETECTED NONE DETECTED   Benzodiazepines NONE DETECTED NONE DETECTED   Amphetamines NONE DETECTED NONE DETECTED   Tetrahydrocannabinol POSITIVE (A) NONE DETECTED   Barbiturates NONE DETECTED NONE DETECTED    Comment: (NOTE) DRUG SCREEN FOR MEDICAL PURPOSES ONLY.  IF CONFIRMATION IS NEEDED FOR ANY PURPOSE, NOTIFY LAB WITHIN 5 DAYS.  LOWEST DETECTABLE LIMITS FOR URINE DRUG SCREEN Drug Class                     Cutoff (ng/mL) Amphetamine and metabolites    1000 Barbiturate and metabolites    200 Benzodiazepine                 200 Opiates and metabolites        300 Cocaine and metabolites        300 THC  50 Performed at [facility], [address] [place], [place] [address]    CT ABDOMEN PELVIS W CONTRAST  Result Date: 01/30/2023 CLINICAL DATA:  Abdominal/flank pain.  Stone suspected. EXAM: CT ABDOMEN AND PELVIS WITH CONTRAST TECHNIQUE: Multidetector CT imaging of the abdomen and pelvis was performed using the standard protocol following bolus administration of intravenous contrast. RADIATION DOSE REDUCTION: This exam was performed according to the departmental dose-optimization program which includes automated exposure control, adjustment of the mA and/or kV according to patient size and/or use of iterative reconstruction technique. CONTRAST:  75mL OMNIPAQUE IOHEXOL 350 MG/ML SOLN  COMPARISON:  Recent prior CT with contrast 01/04/2023, remote prior CT with contrast 12/29/2019. FINDINGS: Lower chest: There are multilevel posterior right ribcage fractures which were noted previously. Most of the fractures demonstrate an early interval healing reaction at the fracture margins, but there is a displaced fracture with overriding of the posterolateral right eighth rib which is entirely unchanged in appearance, additional displaced fracture of the posterior right eleventh rib which is also unchanged in appearance. There is a small layering right pleural effusion which is smaller than previously. Small amount of adjacent atelectasis in the posterior basal right lower lobe. The remaining lung bases are clear. Sternotomy and old CABG changes are again noted with multivessel calcific CAD. The cardiac size is normal. Hepatobiliary: No mass enhancement. Periligamentous fat deposition is again noted in the left lobe. There is a tiny hypodensity in segment 4A which is unchanged. No other focal liver abnormality is seen. There is mild dilatation of the gallbladder without calcified stones, wall thickening or biliary dilatation. Pancreas: No abnormality. Spleen: Enlarged, measuring 19 x 13.3 x 8.6 cm, slightly larger than previously. No focal mass. Adrenals/Urinary Tract: Adrenal glands are unremarkable. Kidneys are normal, without renal calculi, focal lesion, or hydronephrosis. Bladder is unremarkable. Stomach/Bowel: No dilatation or wall thickening. An appendix is not seen. Vascular/Lymphatic: Aortic atherosclerosis. No enlarged abdominal or pelvic lymph nodes. Reproductive: Mild prostatomegaly. Other: There previously was a small right-sided hemoperitoneum which has resolved in the interval. There is no free fluid, free hemorrhage or free air at this time. There is no incarcerated hernia. There are no acute inflammatory changes. Musculoskeletal: Since the prior study there is new demonstration of mild upper  plate anterior wedge compression fractures of the T10, T12, L2 and L4 vertebral bodies with subacute appearance. Unclear if this is due to interval trauma or whether the fractures may have been present previously but at that time radiographically occult. In any case, the appearance of linear sclerosis in the affected vertebral bodies and the lack of paraspinal edema both suggest a subacute age. Fractures of the right L1-4 transverse processes are again noted with no evidence of visible interval healing. No pelvic or proximal femoral fractures are seen. IMPRESSION: 1. No acute soft tissue findings in the abdomen or pelvis. 2. Small right pleural effusion, smaller than previously. 3. Multiple posterior right ribcage fractures which were seen previously, most of which show an early interval healing reaction at the fracture margins. The more displaced fractures of the eighth and eleventh ribs do not show visible interval healing. 4. Fractures of the right L1-4 transverse processes are again noted with no visible interval healing. 5. Mild upper plate anterior wedge compression fractures of the T10, T12, L2 and L4 vertebral bodies, new since the prior study. Unclear if this is due to interval trauma or whether the fractures may have been present previously but at that time radiographically occult. In  any case, the appearance of linear sclerosis in the affected vertebral bodies and the lack of paraspinal edema both suggest a subacute age. 6. Splenomegaly, slightly larger than previously. 7. Aortic and coronary artery atherosclerosis. 8. Mildly dilated gallbladder without calcified stones, wall thickening or biliary dilatation. 9. Mild prostatomegaly. Aortic Atherosclerosis (ICD10-I70.0). Electronically Signed   By: Almira Bar M.D.   On: 01/30/2023 01:22   CT Head Wo Contrast  Result Date: 01/30/2023 CLINICAL DATA:  Mental status change EXAM: CT HEAD WITHOUT CONTRAST TECHNIQUE: Contiguous axial images were obtained  from the base of the skull through the vertex without intravenous contrast. RADIATION DOSE REDUCTION: This exam was performed according to the departmental dose-optimization program which includes automated exposure control, adjustment of the mA and/or kV according to patient size and/or use of iterative reconstruction technique. COMPARISON:  MRI head 01/08/2023 and CT head 01/05/2023 FINDINGS: Brain: No intracranial hemorrhage, mass effect, or evidence of acute infarct. No extra-axial fluid collection. Generalized cerebral atrophy. Ill-defined hypoattenuation within the cerebral white matter is nonspecific but consistent with chronic small vessel ischemic disease. Chronic right basal ganglia infarct. Increased ventricular caliber of the lateral ventricles compared with 01/08/2023. The degree of ventricular dilation appears out of proportion compared to the degree of cerebral atrophy. Vascular: No hyperdense vessel. Intracranial arterial calcification. Skull: No fracture or focal lesion. Sinuses/Orbits: No acute finding. Paranasal sinuses and mastoid air cells are well aerated. Other: None. IMPRESSION: 1. Increased ventricular caliber of the lateral ventricles compared with 01/08/2023. The degree of ventricular dilation appears out of proportion compared to the degree of cerebral atrophy. 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 Electronically Signed   By: Minerva Fester M.D.   On: 01/30/2023 00:58   DG Chest 2 View  Result Date: 01/29/2023 CLINICAL DATA:  Altered mental status EXAM: CHEST - 2 VIEW COMPARISON:  Chest CT 10/23/2021, 12/28/2019, 01/08/2023 FINDINGS: Post sternotomy changes. Multiple displaced right-sided rib fractures. No visible pneumothorax on this exam. Small volume right pleural effusion. Left lung clear. Stable cardiomediastinal silhouette allowing for rotation. Aortic atherosclerosis. Right medial clavicular head and right superior scapular  fractures are again noted. Mild wedging of T12 similar compared to prior IMPRESSION: 1. Multiple displaced right-sided rib fractures as seen on prior exam with small volume right pleural effusion. No visible pneumothorax. 2. Right clavicular head and right superior scapular fractures again noted. Electronically Signed   By: Jasmine Pang M.D.   On: 01/29/2023 19:19    Pending Labs Unresulted Labs (From admission, onward)    None       Vitals/Pain Today's Vitals   01/30/23 0645 01/30/23 0719 01/30/23 0734 01/30/23 0741  BP: (!) 162/72 118/72 123/60   Pulse: 79 87    Resp:  16 16   Temp:  98.2 F (36.8 C) 98.2 F (36.8 C)   TempSrc:   Oral   SpO2: 100% 100%    Weight:      Height:      PainSc:    Asleep    Isolation Precautions No active isolations  Medications Medications  cefTRIAXone (ROCEPHIN) 2 g in sodium chloride 0.9 % 100 mL IVPB (0 g Intravenous Hold 01/30/23 0214)  LORazepam (ATIVAN) injection 0.5 mg (0.5 mg Intravenous Given 01/29/23 2143)  LORazepam (ATIVAN) injection 2 mg (2 mg Intramuscular Given 01/29/23 2324)  iohexol (OMNIPAQUE) 350 MG/ML injection 75 mL (75 mLs Intravenous Contrast Given 01/30/23 0038)    Mobility Not sure about mobility.  Focused Assessments Cardiac Assessment Handoff:    Lab Results  Component Value Date   CKTOTAL 1,972 (H) 01/04/2023   No results found for: "DDIMER" Does the Patient currently have chest pain? No   , Neuro Assessment Handoff:  Swallow screen pass?  Not applicable         Neuro Assessment:   Neuro Checks:      Has TPA been given? No If patient is a Neuro Trauma and patient is going to OR before floor call report to 4N Charge nurse: [phone] or [phone]  , Pulmonary Assessment Handoff:  Lung sounds:   O2 Device: Room Air      R Recommendations: See Admitting Provider Note  Report given to:   Additional Notes: Pt from [facility] for rehab s/t traumatic fall into empty pool. Pt at  [facility] for rehab. Pt A&O x2 at baseline. Pt sleeping, but when I woke up him, he only seems oriented to self. He can be physically aggressive (similar to dementia pts). [facility] sent him here for a possible UTI and increased AMS. UDS +THC. Safety sitter at bedside. NSR w/ rare PVC's. VSS. Blood on his sheet, but not sure where it came from. It is dry. Sitter and I checked pt and even removed BP cuff and did not note any skin tears. Appears he had been stuck on that side, so thought is blood may be from that at some point. Already updated pt's daughter Marchelle Folks on pt status. Pt also does not like to keep telemetry monitor on. Pt is DNR and has DNR at bedside.

## 2023-01-30 NOTE — Progress Notes (Signed)
Patient Spouse called RN for an update. An update was given for the spouse by this RN at this time

## 2023-01-30 NOTE — ED Notes (Signed)
Pt has a stable chest rise and +2 bilateral radial pulses

## 2023-01-31 ENCOUNTER — Encounter (HOSPITAL_COMMUNITY): Payer: Self-pay | Admitting: Internal Medicine

## 2023-01-31 DIAGNOSIS — S42001D Fracture of unspecified part of right clavicle, subsequent encounter for fracture with routine healing: Secondary | ICD-10-CM

## 2023-01-31 DIAGNOSIS — Z515 Encounter for palliative care: Secondary | ICD-10-CM | POA: Diagnosis not present

## 2023-01-31 DIAGNOSIS — R531 Weakness: Secondary | ICD-10-CM | POA: Diagnosis not present

## 2023-01-31 DIAGNOSIS — F03918 Unspecified dementia, unspecified severity, with other behavioral disturbance: Secondary | ICD-10-CM

## 2023-01-31 DIAGNOSIS — I2510 Atherosclerotic heart disease of native coronary artery without angina pectoris: Secondary | ICD-10-CM

## 2023-01-31 DIAGNOSIS — I10 Essential (primary) hypertension: Secondary | ICD-10-CM | POA: Diagnosis not present

## 2023-01-31 DIAGNOSIS — E782 Mixed hyperlipidemia: Secondary | ICD-10-CM

## 2023-01-31 DIAGNOSIS — S42001A Fracture of unspecified part of right clavicle, initial encounter for closed fracture: Secondary | ICD-10-CM | POA: Insufficient documentation

## 2023-01-31 DIAGNOSIS — G4733 Obstructive sleep apnea (adult) (pediatric): Secondary | ICD-10-CM

## 2023-01-31 DIAGNOSIS — G9341 Metabolic encephalopathy: Secondary | ICD-10-CM | POA: Diagnosis not present

## 2023-01-31 DIAGNOSIS — W1789XA Other fall from one level to another, initial encounter: Secondary | ICD-10-CM

## 2023-01-31 LAB — BASIC METABOLIC PANEL
Anion gap: 11 (ref 5–15)
BUN: 12 mg/dL (ref 8–23)
CO2: 21 mmol/L — ABNORMAL LOW (ref 22–32)
Calcium: 9 mg/dL (ref 8.9–10.3)
Chloride: 104 mmol/L (ref 98–111)
Creatinine, Ser: 1.13 mg/dL (ref 0.61–1.24)
GFR, Estimated: >60 mL/min (ref >60)
Glucose, Bld: 106 mg/dL — ABNORMAL HIGH (ref 70–99)
Potassium: 3.5 mmol/L (ref 3.5–5.1)
Sodium: 136 mmol/L (ref 135–145)

## 2023-01-31 LAB — CBC
HCT: 39.7 % (ref 39.0–52.0)
Hemoglobin: 13 g/dL (ref 13.0–17.0)
MCH: 30.4 pg (ref 26.0–34.0)
MCHC: 32.7 g/dL (ref 30.0–36.0)
MCV: 93 fL (ref 80.0–100.0)
Platelets: 278 10*3/uL (ref 150–400)
RBC: 4.27 MIL/uL (ref 4.22–5.81)
RDW: 15.9 % — ABNORMAL HIGH (ref 11.5–15.5)
WBC: 6.3 10*3/uL (ref 4.0–10.5)
nRBC: 0 % (ref 0.0–0.2)

## 2023-01-31 MED ORDER — QUETIAPINE FUMARATE 25 MG PO TABS
25.0000 mg | ORAL_TABLET | Freq: Three times a day (TID) | ORAL | Status: DC
  Administered 2023-01-31 – 2023-02-01 (×5): 25 mg via ORAL
  Filled 2023-01-31 (×6): qty 1

## 2023-01-31 MED ORDER — ENSURE ENLIVE PO LIQD
237.0000 mL | Freq: Two times a day (BID) | ORAL | Status: DC
  Administered 2023-01-31 – 2023-02-07 (×14): 237 mL via ORAL

## 2023-01-31 MED ORDER — LISINOPRIL 20 MG PO TABS
20.0000 mg | ORAL_TABLET | Freq: Every day | ORAL | Status: DC
  Administered 2023-01-31 – 2023-02-07 (×7): 20 mg via ORAL
  Filled 2023-01-31 (×8): qty 1

## 2023-01-31 NOTE — Evaluation (Signed)
Speech Language Pathology Evaluation Patient Details Name: Omar Kennedy MRN: [id number] DOB: May 10, 1946 Today's Date: 01/31/2023 Time: 4034-7425 SLP Time Calculation (min) (ACUTE ONLY): 18 min  Problem List:  Patient Active Problem List   Diagnosis Date Noted   Hematuria 01/31/2023   AMS (altered mental status) 01/30/2023   Dementia with behavioral disturbance (HCC) 01/30/2023   Protein-calorie malnutrition, severe 01/18/2023   Fall from height of greater than 3 feet 01/04/2023   Mixed hyperlipidemia 12/29/2019   Hilar adenopathy 12/29/2019   Dehydration 12/29/2019   Unexplained weight loss 12/29/2019   Aspiration pneumonia of right lower lobe due to gastric secretions (HCC) 12/29/2019   Acute metabolic encephalopathy 12/29/2019   CAP (community acquired pneumonia) 12/29/2019   Sleep apnea 09/26/2019   OSA (obstructive sleep apnea) 08/03/2019   Nocturia more than twice per night 08/03/2019   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   Vivid dream 08/03/2019   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   Essential hypertension 08/12/2017   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   Coronary artery disease involving native coronary artery of native heart without angina pectoris 05/27/2017   Elevated blood-pressure reading without diagnosis of hypertension 12/10/2015   Hypercholesterolemia 12/10/2015   Memory loss, short term 12/10/2015   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/2015   Nasal polyps 12/10/2015   Poorly-controlled hypertension 12/10/2015   Enlarged prostate 05/20/2011   Insomnia 05/20/2011   Past Medical History:  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/2015   Nasal turbinate hypertrophy    Obstructive  sleep apnea 06/2018   Not on CPAP until after sinus surgery; positional therapy not helpful   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   Vertigo    Past Surgical History:  Past Surgical History:  Procedure Laterality Date   CARDIAC SURGERY     CORONARY ARTERY BYPASS GRAFT  2018   ENDOSCOPIC CONCHA BULLOSA RESECTION Bilateral 08/02/2018   Procedure: ENDOSCOPIC BILATERAL CONCHA BULLOSA RESECTION;  Surgeon: Newman Pies, MD;  Location: [facility];  Service: ENT;  Laterality: Bilateral;   ETHMOIDECTOMY Bilateral 08/02/2018   Procedure: TOTAL ETHMOIDECTOMY AND SPHENOIDECTOMY;  Surgeon: Newman Pies, MD;  Location: [facility];  Service: ENT;  Laterality: Bilateral;   EXCISION MORTON'S NEUROMA Left    FRONTAL SINUS EXPLORATION Bilateral 08/02/2018   Procedure: ENDOSCOPIC BILATERAL FRONTAL RECESS EXPLORATION;  Surgeon: Newman Pies, MD;  Location: [facility];  Service: ENT;  Laterality: Bilateral;   HERNIA REPAIR     MAXILLARY ANTROSTOMY Bilateral 08/02/2018   Procedure: ENDOSCOPIC BILATERAL MAXILLARY ANTROSTOMY WITH TISSUE REMOVAL;  Surgeon: Newman Pies, MD;  Location: [facility];  Service: ENT;  Laterality: Bilateral;   NASAL SEPTOPLASTY W/ TURBINOPLASTY Bilateral 08/02/2018   Procedure: NASAL SEPTOPLASTY WITH BILATERAL TURBINATE REDUCTION;  Surgeon: Newman Pies, MD;  Location: [facility];  Service: ENT;  Laterality: Bilateral;   SINUS ENDO WITH FUSION Bilateral 08/02/2018   Procedure: SINUS ENDO WITH FUSION;  Surgeon: Newman Pies, MD;  Location: [facility];  Service: ENT;  Laterality: Bilateral;   umbilical surgery     HPI:  77 yo male admitted 7/12 from [facility] with AMS and hematuria. PMhx:  01/04/23-7/5 admission after falling into empty pool; Sustained R scalp  laceration, T11 fx, multiple TVP fxs, SAH/ SDH, R chest wall hematoma, R clavicle fx (NWB, sling), L scapula fx (WBAT), R rib fxs 5-6. dementia, CAD x/p CABG, HTN, HLD, OSAP    Assessment / Plan / Recommendation Clinical Impression  Mr. Dellaquila is known to ST service from recent hospitalization for TBI. Unfortunately, cognitive-linguistics appear to have declined over the past two weeks, likely d/t metabolic encephalopathy and/or NPH. Upon arrival, RN reported pt had recently received Haldol and was "mellow." He politely smile and said "hi" to SLP, but then rested his head in his hands and closed his eyes. SLP requested brief evaluation and he nodded in agreement. Unfortunately, pt is noted with severe cognitive communication impairment today, with no intelligible/meaningful utterances outside of the initial greeting. SLP used max verbal, visual, and gestural cues to attempt to get one word answers or even gestures from him. He did produce single word utterances upon questioning, but none were intelligible or meaningful. 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 He was unable to participate in yes/no questions at basic level despite obvious effort. He nodded his head "yes" to all multiple answer questions ("Is it June, July, or August?") At this time, pt does not appear able to express his basic wants/needs, so they will have to be anticipated. He does have call bell at bedside and SLP verbally and gesturally showed how to use it to call for help. Palliative consult was completed today. Pt may benefit from ongoing ST pending GOC.     SLP Assessment  SLP Recommendation/Assessment: Patient needs continued Speech Lanaguage Pathology Services SLP Visit Diagnosis: Cognitive communication deficit (R41.841)    Recommendations for follow up therapy are one component of a multi-disciplinary discharge planning process, led by the attending physician.  Recommendations may be updated based on patient status, additional functional criteria and insurance authorization.    Follow Up Recommendations  SLP at  Long-term acute care hospital    Assistance Recommended at Discharge  Intermittent Supervision/Assistance  Functional Status Assessment Patient has had a recent decline in their functional status and demonstrates the ability to make significant improvements in function in a reasonable and predictable amount of time.  Frequency and Duration min 1 x/week  2 weeks      SLP Evaluation Cognition  Overall Cognitive Status: Impaired/Different from baseline Arousal/Alertness: Suspect due to medications Orientation Level: Oriented to person;Disoriented to place;Disoriented to time;Disoriented to situation Sustained Attention: Impaired Awareness: Impaired       Comprehension  Auditory Comprehension Overall Auditory Comprehension: Impaired Yes/No Questions: Impaired Basic Biographical Questions: 0-25% accurate Commands: Impaired One Step Basic Commands: 0-24% accurate Conversation: Simple Interfering Components: Attention;Processing speed Visual Recognition/Discrimination Discrimination: Not tested Reading Comprehension Reading Status: Not tested    Expression Expression Primary Mode of Expression: Verbal Verbal Expression Overall Verbal Expression: Impaired Initiation: No impairment Level of Generative/Spontaneous Verbalization: Word Repetition: Impaired (NT) Level of Impairment: Word level Naming: Impairment (named common objects 2/3) Responsive: 0-25% accurate Confrontation: Impaired Convergent: 0-24% accurate Divergent: 0-24% accurate Verbal Errors: Phonemic paraphasias;Semantic paraphasias;Language of confusion Pragmatics: Impairment Impairments: Eye contact;Abnormal affect Interfering Components: Attention Written Expression Written Expression: Not tested   Oral / Motor  Oral Motor/Sensory Function Overall Oral Motor/Sensory Function: Generalized oral weakness Motor Speech Overall Motor Speech: Impaired Articulation: Impaired Intelligibility: Intelligibility  reduced Motor Speech Errors: Unaware           Bernarr Longsworth P. Wendy Hoback, M.S., CCC-SLP Speech-Language Pathologist Acute Rehabilitation  Services Pager: [phone]  Susanne Borders Rozelia Catapano 01/31/2023, 9:59 AM

## 2023-01-31 NOTE — Consult Note (Signed)
Consultation Note Date: 01/31/2023   Patient Name: Omar Kennedy  DOB: [birth year]  MRN: [id number]  Age / Sex: 77 y.o., male  PCP: [facility] Referring Physician: Alberteen Sam, *  Reason for Consultation: Establishing goals of care and Psychosocial/spiritual support  HPI/Patient Profile: 77 y.o. male   admitted on 01/29/2023 with  with medical history significant of dementia, CAD s/p CABG, HTN, HLD, and OSA on CPAP presenting with hematuria and AMS.    He was last hospitalized from 6/17-7/5 after having been found down in an empty swimming pool, an estimated 10 foot fall.  He suffered a T11 fracture, multiple thoracic and lumbar spine transverse process fractures, SAH/SHD, R scalp laceration, R chest wall hematoma, R clavicle fracture, L scapula fracture, and multiple rib fractures with small PTX; he was discharged to SNF rehab.  Family report increased confusion through last week at SNF/[facility]. Patient with severe agitation.   Admitted for treatment and stabilization.    Family face treatment option decisions, advanced directive decisions and anticipatory care needs.        Clinical Assessment and Goals of Care:  This NP Lorinda Creed reviewed medical records, received report from team, assessed the patient and then meet at the patient's bedside along with his wife/ [name] and her sister/Ann to discuss diagnosis, prognosis, GOC, EOL wishes disposition and options.  Patient talked with PMT  on last admission, 01-14-23    Concept of Palliative Care was re-introduced as specialized medical care for people and their families living with serious illness.  If focuses on providing relief from the symptoms and stress of a serious illness.  The goal is to improve quality of life for both the patient and the family.  Values and goals of care important to patient and family were  attempted to be elicited.  Created space and opportunity for family to explore thoughts and feelings regarding current medical situation.  Patient does not have the capacity to participate in today's conversation.  Patient has been married to his wife for 42 years, they have 3 daughters.  [place]  Family speak to the overall difficulty of the situation.    Their experience at the rehab was not good.  Patient was agitated and unable to realistically participate in rehab.  Patient's dementia has been progressing over the past several years, however the past several months have been devastating.Marland Kitchen   He has always been strong independent man, "running the show".   His loss of independence has been devastating to him.  His agitation  and paranoid behaviors have worsened.  Family is requesting a psych consult for recommendations on agitated behaviors associated with dementia.   A  discussion was had today regarding advanced directives.  Concepts specific to code status, artifical feeding and hydration, continued IV antibiotics and rehospitalization was had.  The difference between a aggressive medical intervention path  and a palliative comfort care path for this patient at this time was had.  MOST form introduced   The difference between a full medical support path and a palliative comfort path was outlined.     Education offered on hospice benefit; philosophy and eligibility in both the home and in patient hospice facility.  The importance of securing a clear goals of care and treatment plan stressed.  Family are currently investigating possible transitions of care.  They are looking at a facility in [place], close to where 1 daughter lives.  Patient's wife thinks this could be a good solution.  She will get the contact information from the facility that they are looking at for our transition of care team to collaborate with.   Questions and concerns  addressed.     Family encouraged to call with questions or concerns.     PMT will continue to support holistically.             NEXT OF KIN/ wife--family will look for advance care planning documents and bring in for scanning.    SUMMARY OF RECOMMENDATIONS    Code Status/Advance Care Planning: DNR Wife plans to speak with her daughters tonight, goal is to clarify treatment plan and secure MOST form   Symptom Management:  Agitation -- consider increasing Seroquel dose and frequency   Palliative Prophylaxis:  Aspiration, Bowel Regimen, Delirium Protocol, Frequent Pain Assessment, and Oral Care  Additional Recommendations (Limitations, Scope, Preferences): Family requesting psych consult for dementia related behavior symptoms  Psycho-social/Spiritual:  Desire for further Chaplaincy support:no Additional Recommendations: Education on Hospice  Prognosis:  Unable to determine  Discharge Planning: To Be Determined      Primary Diagnoses: Present on Admission:  Acute metabolic encephalopathy  Essential hypertension  Hypercholesterolemia  Dementia with behavioral disturbance (HCC)  Coronary artery disease involving native coronary artery of native heart without angina pectoris  Mixed hyperlipidemia  OSA (obstructive sleep apnea)   I have reviewed the medical record, interviewed the patient and family, and examined the patient. The following aspects are pertinent.  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/2015   Nasal turbinate hypertrophy    Obstructive sleep apnea 06/2018   Not on CPAP until after sinus surgery; positional therapy not helpful   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   Vertigo    Social  History   Socioeconomic History   Marital status: Married    Spouse name: Not on file   Number of children: Not on file   Years of education: 20   Highest education level: Doctorate  Occupational History   Not on file  Tobacco Use   Smoking status: Not on file   Smokeless tobacco: Never  Vaping Use   Vaping status: Never Used  Substance and Sexual Activity   Alcohol use: Yes    Alcohol/week: 7.0 standard drinks of alcohol    Types: 7 Cans of beer per week    Comment: social   Drug use: Never   Sexual activity: Not on file  Other Topics Concern   Not on file  Social History Narrative   ** Merged History Encounter **       Lives with wife in one level R handed Caffeine - not much Exercise - not much - 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     Social Determinants of Health   Financial Resource Strain: Not on file  Food Insecurity: Not on file  Transportation Needs: Not on file  Physical Activity: Not on file  Stress: Not on file  Social Connections: Not on file   Family History  Problem Relation Age of Onset   Stroke Mother    Scheduled Meds:  amLODipine  5 mg Oral Daily   carvedilol  6.25 mg Oral Daily   docusate sodium  100 mg Oral BID   escitalopram  5 mg Oral Daily   feeding supplement  237 mL Oral BID BM   fluticasone  1 spray Each Nare QPM   lidocaine  2 patch Transdermal Q24H   loratadine  10 mg Oral Daily   megestrol  400 mg Oral Daily   pantoprazole  40 mg Oral QHS   QUEtiapine  25 mg Oral QHS   rivastigmine  3 mg Oral BID   rosuvastatin  40 mg Oral QHS   senna  2 tablet Oral BID   sodium chloride flush  3 mL Intravenous Q12H   Continuous Infusions:  lactated ringers     PRN Meds:.acetaminophen **OR** acetaminophen, bisacodyl, haloperidol lactate, hydrALAZINE, morphine injection, ondansetron **OR** ondansetron (ZOFRAN) IV, oxyCODONE, polyethylene glycol Medications Prior to Admission:  Prior to Admission  medications   Medication Sig Start Date End Date Taking? Authorizing Provider  acetaminophen (TYLENOL) 500 MG tablet Take 1,000 mg by mouth every 6 (six) hours as needed for headache.   Yes [provider]  amLODipine (NORVASC) 5 MG tablet Take 5 mg by mouth daily. 12/22/22  Yes [provider]  aspirin EC 81 MG tablet Take 81 mg by mouth daily. Swallow whole.   Yes [provider]  calcium carbonate (OS-CAL) 1250 (500 Ca) MG chewable tablet Chew 1 tablet by mouth daily.   Yes [provider]  carvedilol (COREG) 6.25 MG tablet Take 6.25 mg by mouth daily.   Yes [provider]  cetirizine (ZYRTEC) 10 MG tablet Take 10 mg by mouth daily.   Yes [provider]  cholecalciferol (VITAMIN D3) 25 MCG (1000 UNIT) tablet Take 1,000 Units by mouth daily. gummies   Yes [provider]  escitalopram (LEXAPRO) 5 MG tablet Take 5 mg by mouth daily. 12/22/22  Yes [provider]  lidocaine (LIDODERM) 5 % Place 2 patches onto the skin daily. Remove & Discard patch within 12 hours or as directed by MD Patient taking differently: Place 2 patches onto the skin daily. Remove & Discard patch within 12 hours or as directed by MD 01/22/23  Yes Maczis, Elmer Sow, PA-C  megestrol (MEGACE) 400 MG/10ML suspension Take 10 mLs (400 mg total) by mouth daily. 01/23/23  Yes Maczis, Elmer Sow, PA-C  Multiple Vitamins-Minerals (HAIR SKIN AND NAILS FORMULA PO) Take 1 tablet by mouth daily.   Yes [provider]  polyethylene glycol (MIRALAX / GLYCOLAX) 17 g packet Place 17 g into feeding tube 2 (two) times daily as needed. 01/22/23  Yes Maczis, Elmer Sow, PA-C  rivastigmine (EXELON) 3 MG capsule Take 3 mg by mouth 2 (two) times daily. 11/30/22  Yes [provider]  senna (SENOKOT) 8.6 MG TABS tablet Take 2 tablets (17.2 mg total) by mouth at bedtime. Patient taking differently: Take 2 tablets by mouth 2 (two) times daily. 01/22/23  Yes Maczis, Elmer Sow,  PA-C  vitamin B-12 (CYANOCOBALAMIN) 100 MCG tablet Take 100 mcg by mouth daily. gummies  Yes [provider]  acetaminophen (TYLENOL) 325 MG tablet Take 650 mg by mouth every 6 (six) hours as needed for mild pain.  Patient not taking: Reported on 01/30/2023 06/21/17   [provider]  ALPRAZolam (XANAX) 0.25 MG tablet TAKE 1 TABLET (0.25 MG TOTAL) BY MOUTH AT BEDTIME AS NEEDED FOR ANXIETY. Patient not taking: Reported on 01/30/2023 12/06/19   Dohmeier, Porfirio Mylar, MD  ascorbic acid (VITAMIN C) 500 MG tablet Take by mouth. Patient not taking: Reported on 01/30/2023    [provider]  calcium-vitamin D (OSCAL WITH D) 500-200 MG-UNIT tablet Take 1 tablet by mouth. Patient not taking: Reported on 01/30/2023    [provider]  cefpodoxime (VANTIN) 200 MG tablet Take 1 tablet (200 mg total) by mouth 2 (two) times daily. Patient not taking: Reported on 01/30/2023 12/31/19   Meredeth Ide, MD  docusate sodium (COLACE) 100 MG capsule Take 1 capsule (100 mg total) by mouth 2 (two) times daily as needed for mild constipation. Patient not taking: Reported on 01/30/2023 01/22/23   Jacinto Halim, PA-C  ferrous sulfate 325 (65 FE) MG EC tablet Take 325 mg by mouth daily with breakfast.  Patient not taking: Reported on 01/30/2023    [provider]  fluticasone (FLONASE) 50 MCG/ACT nasal spray Place 1 spray into both nostrils every evening.    [provider]  lisinopril (ZESTRIL) 20 MG tablet Take 20 mg by mouth daily. Patient not taking: Reported on 01/30/2023 06/29/19   [provider]  meclizine (ANTIVERT) 12.5 MG tablet Take 1 tablet (12.5 mg total) by mouth 3 (three) times daily as needed for dizziness. Patient not taking: Reported on 01/30/2023 12/30/19   Meredeth Ide, MD  Melatonin 10 MG TABS Take by mouth. Takes 3 mg tab at night Patient not taking: Reported on 01/30/2023    [provider]  mirtazapine (REMERON) 15 MG tablet TAKE 1 TABLET  BY MOUTH EVERYDAY AT BEDTIME Patient not taking: Reported on 01/30/2023 01/17/20   Van Clines, MD  Multiple Vitamin (MULTIVITAMIN WITH MINERALS) TABS tablet Take 1 tablet by mouth daily. Patient not taking: Reported on 01/30/2023    [provider]  pantoprazole (PROTONIX) 40 MG tablet Take 40 mg by mouth at bedtime. 12/29/22   [provider]  QUEtiapine (SEROQUEL) 25 MG tablet Take 1 tablet (25 mg total) by mouth at bedtime. 01/22/23   Maczis, Elmer Sow, PA-C  rosuvastatin (CRESTOR) 40 MG tablet Take 40 mg by mouth at bedtime. 10/13/22   [provider]  sildenafil (VIAGRA) 100 MG tablet Take 100 mg by mouth daily as needed for erectile dysfunction.  Patient not taking: Reported on 01/30/2023 02/07/19   [provider]  vitamin B-12 (CYANOCOBALAMIN) 1000 MCG tablet Take 1,000 mcg by mouth daily. Patient not taking: Reported on 01/30/2023    [provider]   Allergies  Allergen Reactions   Tetracycline Other (See Comments)    VERTIGO   Amoxicillin-Pot Clavulanate Nausea Only   Cephalexin Other (See Comments)    vertigo   Wasp Venom Swelling   Review of Systems  Unable to perform ROS: Dementia    Physical Exam Constitutional:      Appearance: He is underweight. He is ill-appearing.  Cardiovascular:     Rate and Rhythm: Normal rate.  Pulmonary:     Effort: Pulmonary effort is normal.  Skin:    General: Skin is warm and dry.  Neurological:     Mental Status: He  is lethargic.    Vital Signs: BP (!) 143/77 (BP Location: Left Arm)   Pulse 88   Temp 97.9 F (36.6 C)   Resp 16   Ht 5\' 10"  (1.778 m)   Wt 77.1 kg   SpO2 95%   BMI 24.39 kg/m  Pain Scale: Faces   Pain Score: 0-No pain   SpO2: SpO2: 95 % O2 Device:SpO2: 95 % O2 Flow Rate: .   IO: Intake/output summary:  Intake/Output Summary (Last 24 hours) at 01/31/2023 0835 Last data filed at 01/30/2023 1300 Gross per 24 hour  Intake 0 ml  Output --  Net 0 ml    LBM:    Baseline Weight: Weight: 77.1 kg Most recent weight: Weight: 77.1 kg     Palliative Assessment/Data: 40 % at best     Time 90 minutes  Discussed with Dr Maryfrances Bunnell   Signed by: Lorinda Creed, NP   Please contact Palliative Medicine Team phone at [phone] for questions and concerns.  For individual provider: See Loretha Stapler

## 2023-01-31 NOTE — Evaluation (Signed)
Occupational Therapy Evaluation Patient Details Name: Omar Kennedy MRN: [id number] DOB: 1946/01/27 Today's Date: 01/31/2023   History of Present Illness 77 yo male admitted 7/12 from [facility] with AMS and hematuria. PMhx:  01/04/23-7/5 admission after falling into empty pool; Sustained R scalp laceration, T11 fx, multiple TVP fxs, SAH/ SDH, R chest wall hematoma, R clavicle fx (NWB, sling), L scapula fx (WBAT), R rib fxs 5-6. dementia, CAD x/p CABG, HTN, HLD, OSAP   Clinical Impression   Pt evaluated s/p above admission list. Pt questionable historian from SNF with no caregiver present to provide PLOF. Per last admission, pt NWB at RUE with sling, no sling present in room this session and pt requiring max cues to maintain precautions. Pt presents this session with generalized weakness, decreased balance and poor cognition requiring mod-max multimodal cues throughout session to follow simple 1 step commands. Pt currently requires mod A for seated UB ADLs and max A for LB ADLs. Pt completed STS transfer from recliner and room level mobility with min A via HHA and max cues to maintain RUE NWB. Pt would benefit from continued acute OT services to maximize functional independence and facilitate transition to skilled inpatient follow up therapy, <3 hours/day.        Recommendations for follow up therapy are one component of a multi-disciplinary discharge planning process, led by the attending physician.  Recommendations may be updated based on patient status, additional functional criteria and insurance authorization.   Assistance Recommended at Discharge Frequent or constant Supervision/Assistance  Patient can return home with the following A little help with walking and/or transfers;A lot of help with bathing/dressing/bathroom;Assistance with cooking/housework;Direct supervision/assist for medications management;Direct supervision/assist for financial management;Assist for transportation;Help  with stairs or ramp for entrance    Functional Status Assessment  Patient has had a recent decline in their functional status and demonstrates the ability to make significant improvements in function in a reasonable and predictable amount of time.  Equipment Recommendations  Other (comment) (defer)    Recommendations for Other Services       Precautions / Restrictions Precautions Precautions: Back;Fall Precaution Comments: pt had sling and spinal brace last admission without either present currently and pt unable to recall, retain or follow cues for precautions Restrictions Weight Bearing Restrictions: Yes RUE Weight Bearing: Non weight bearing (based upon last admission with clavicle fracture, no sling present) LUE Weight Bearing: Weight bearing as tolerated Other Position/Activity Restrictions: pt lying on RUE hooked under head on arrival. max cues with mobility to limit use of pushing with RUE as pt unable to recall      Mobility Bed Mobility Overal bed mobility: Needs Assistance Bed Mobility: Sit to Supine Rolling: Min assist         General bed mobility comments: HOB elevated, assist for RLE progression    Transfers Overall transfer level: Needs assistance Equipment used: 1 person hand held assist Transfers: Sit to/from Stand Sit to Stand: Min assist           General transfer comment: STS transfer from recliner with min A via HHA. Room level mobility with min A and HHA.      Balance Overall balance assessment: Needs assistance Sitting-balance support: Bilateral upper extremity supported, Feet supported Sitting balance-Leahy Scale: Poor Sitting balance - Comments: sitting EOB   Standing balance support: Single extremity supported, During functional activity Standing balance-Leahy Scale: Poor Standing balance comment: UE support  ADL either performed or assessed with clinical judgement   ADL Overall ADL's : Needs  assistance/impaired Eating/Feeding: Set up;Sitting   Grooming: Oral care;Minimal assistance;Standing Grooming Details (indicate cue type and reason): assist to apply toothpaste to toothbrush, max cues to initiate oral care, min guard A for stability at sink Upper Body Bathing: Moderate assistance;Sitting   Lower Body Bathing: Maximal assistance;Sit to/from stand   Upper Body Dressing : Moderate assistance;Sitting   Lower Body Dressing: Maximal assistance;Sit to/from stand   Toilet Transfer: Minimal Holiday representative;Ambulation Toilet Transfer Details (indicate cue type and reason): simulated Toileting- Clothing Manipulation and Hygiene: Total assistance;Sit to/from stand       Functional mobility during ADLs: Minimal assistance General ADL Comments: limited secondary to generalized weakness, decreased balance, poor command following     Vision Baseline Vision/History: 0 No visual deficits Ability to See in Adequate Light: 0 Adequate Vision Assessment?: No apparent visual deficits     Perception Perception Perception Tested?: No   Praxis Praxis Praxis tested?: Not tested    Pertinent Vitals/Pain Pain Assessment Pain Assessment: Faces Faces Pain Scale: Hurts little more Pain Location: generalized Pain Descriptors / Indicators: Grimacing, Guarding Pain Intervention(s): Monitored during session, Limited activity within patient's tolerance     Hand Dominance     Extremity/Trunk Assessment Upper Extremity Assessment Upper Extremity Assessment: LUE deficits/detail;RUE deficits/detail RUE Deficits / Details: NWB per last admit, no sling present. Required max cues to maintain NWB LUE Deficits / Details: [facility]   Lower Extremity Assessment Lower Extremity Assessment: Defer to PT evaluation   Cervical / Trunk Assessment Cervical / Trunk Assessment: Kyphotic   Communication Communication Communication: No difficulties   Cognition Arousal/Alertness:  Awake/alert Behavior During Therapy: Flat affect Overall Cognitive Status: History of cognitive impairments - at baseline                                 General Comments: Pt with history of dementia, follows simple 1 step commands inconsistently with mod-max multimodal cues     General Comments  VSS on RA, friend present during session    Exercises     Shoulder Instructions      Home Living Family/patient expects to be discharged to:: Skilled nursing facility Living Arrangements: Other (Comment) Available Help at Discharge: Family;Available 24 hours/day Type of Home: House Home Access: Stairs to enter [employer] of Steps: 2   Home Layout: Two level;Able to live on main level with bedroom/bathroom               Home Equipment: None   Additional Comments: last admission from home with wife who is currently using a w/c per last admission as pt unable to provide PLOF. Pt admitted from SNF      Prior Functioning/Environment Prior Level of Function : Patient poor historian/Family not available             Mobility Comments: unsure but was ambulatory ADLs Comments: Unsure, most likely assist from staff at [facility] for ADL/IADLs        OT Problem List: Decreased strength;Decreased range of motion;Decreased activity tolerance;Impaired balance (sitting and/or standing);Decreased cognition;Decreased safety awareness;Decreased knowledge of precautions;Impaired UE functional use      OT Treatment/Interventions: Self-care/ADL training;Therapeutic exercise;Energy conservation;DME and/or AE instruction;Therapeutic activities;Cognitive remediation/compensation;Patient/family education;Balance training    OT Goals(Current goals can be found in the care plan section) Acute Rehab OT Goals Patient Stated Goal: to go to bed OT Goal Formulation:  With patient Time For Goal Achievement: 02/14/23 Potential to Achieve Goals: Fair ADL Goals Pt Will Perform  Grooming: with min guard assist;standing Pt Will Perform Upper Body Dressing: with min assist;sitting Pt Will Transfer to Toilet: with supervision;ambulating;regular height toilet Additional ADL Goal #1: Pt will complete 1 step ADL task with min cues  OT Frequency: Min 2X/week    Co-evaluation              AM-PAC OT "6 Clicks" Daily Activity     Outcome Measure Help from another person eating meals?: A Little Help from another person taking care of personal grooming?: A Little Help from another person toileting, which includes using toliet, bedpan, or urinal?: A Little Help from another person bathing (including washing, rinsing, drying)?: A Lot Help from another person to put on and taking off regular upper body clothing?: A Lot Help from another person to put on and taking off regular lower body clothing?: A Lot 6 Click Score: 15   End of Session Equipment Utilized During Treatment: Gait belt Nurse Communication: Mobility status  Activity Tolerance: Patient tolerated treatment well Patient left: in bed;with call bell/phone within reach;with bed alarm set  OT Visit Diagnosis: Unsteadiness on feet (R26.81);History of falling (Z91.81);Muscle weakness (generalized) (M62.81);Other symptoms and signs involving cognitive function                Time: 1610-9604 OT Time Calculation (min): 31 min Charges:  OT General Charges $OT Visit: 1 Visit OT Evaluation $OT Eval Moderate Complexity: 1 Mod  Sherley Bounds, OTS Acute Rehabilitation Services Office [phone] Secure Chat Communication Preferred   Sherley Bounds 01/31/2023, 12:00 PM

## 2023-01-31 NOTE — Plan of Care (Signed)
  Problem: Education: Goal: Knowledge of General Education information will improve Description: Including pain rating scale, medication(s)/side effects and non-pharmacologic comfort measures 01/31/2023 0526 by Lisabeth Pick, RN Outcome: Progressing 01/31/2023 0526 by Lisabeth Pick, RN Outcome: Progressing   Problem: Health Behavior/Discharge Planning: Goal: Ability to manage health-related needs will improve 01/31/2023 0526 by Lisabeth Pick, RN Outcome: Progressing 01/31/2023 0526 by Lisabeth Pick, RN Outcome: Progressing   Problem: Clinical Measurements: Goal: Ability to maintain clinical measurements within normal limits will improve 01/31/2023 0526 by Lisabeth Pick, RN Outcome: Progressing 01/31/2023 0526 by Lisabeth Pick, RN Outcome: Progressing Goal: Will remain free from infection 01/31/2023 0526 by Lisabeth Pick, RN Outcome: Progressing 01/31/2023 0526 by Lisabeth Pick, RN Outcome: Progressing Goal: Diagnostic test results will improve 01/31/2023 0526 by Lisabeth Pick, RN Outcome: Progressing 01/31/2023 0526 by Lisabeth Pick, RN Outcome: Progressing Goal: Respiratory complications will improve 01/31/2023 0526 by Lisabeth Pick, RN Outcome: Progressing 01/31/2023 0526 by Lisabeth Pick, RN Outcome: Progressing Goal: Cardiovascular complication will be avoided 01/31/2023 0526 by Lisabeth Pick, RN Outcome: Progressing 01/31/2023 0526 by Lisabeth Pick, RN Outcome: Progressing   Problem: Activity: Goal: Risk for activity intolerance will decrease 01/31/2023 0526 by Lisabeth Pick, RN Outcome: Progressing 01/31/2023 0526 by Lisabeth Pick, RN Outcome: Progressing   Problem: Nutrition: Goal: Adequate nutrition will be maintained 01/31/2023 0526 by Lisabeth Pick, RN Outcome: Progressing 01/31/2023 0526 by Lisabeth Pick, RN Outcome: Progressing   Problem: Coping: Goal: Level of anxiety will decrease 01/31/2023 0526 by Lisabeth Pick, RN Outcome:  Progressing 01/31/2023 0526 by Lisabeth Pick, RN Outcome: Progressing

## 2023-01-31 NOTE — Progress Notes (Signed)
Initial Nutrition Assessment  DOCUMENTATION CODES:   Not applicable  INTERVENTION:  - Add Ensure Enlive po BID, each supplement provides 350 kcal and 20 grams of protein.   NUTRITION DIAGNOSIS:   Inadequate oral intake related to lethargy/confusion as evidenced by meal completion < 25%.  GOAL:   Patient will meet greater than or equal to 90% of their needs  MONITOR:   PO intake, Supplement acceptance  REASON FOR ASSESSMENT:   Consult Assessment of nutrition requirement/status  ASSESSMENT:   77 y.o. male admits related to AMS. PMH includes: dementia, CAD s/p CABG, HTN, HLD, OSA on CPAP. Pt is currently receiving medical management related to acute metabolic encephalopathy.  Meds reviewed: colace, megace, senokot. Labs reviewed: alk phos elevated.   Pt is oriented x1. Pt with recent hospital admission related to TBI/SDH and multiple fractures. Per record, pt has not eaten anything since admission. RD will add Ensure BID for now and continue to monitor PO intakes. No significant wt loss per record.   NUTRITION - FOCUSED PHYSICAL EXAM:  Remote assessment.  Diet Order:   Diet Order             Diet regular Room service appropriate? Yes; Fluid consistency: Thin  Diet effective now                   EDUCATION NEEDS:   Not appropriate for education at this time  Skin:  Skin Assessment: Reviewed RN Assessment  Last BM:  PTA  Height:   Ht Readings from Last 1 Encounters:  01/29/23 5\' 10"  (1.778 m)    Weight:   Wt Readings from Last 1 Encounters:  01/29/23 77.1 kg    Ideal Body Weight:     BMI:  Body mass index is 24.39 kg/m.  Estimated Nutritional Needs:   Kcal:  1930-2300 kcals  Protein:  95-115 gm  Fluid:  >/= 2 L  Omar Kennedy, RD, LDN, CNSC.

## 2023-01-31 NOTE — Assessment & Plan Note (Addendum)
At baseline prior to his fall, patient diagnosed with advanced dementia (SLUMS 18/30 in 2021, MMSE 13/30 in 01/2022) but lived at home, was conversant, recognized family, and participated in self cares.  Significantly worse since fall/multitrauma in June but was improving and cooperative when discharged from Trauma stay. After few days in SNF rehab, he became agitated, difficult to redirect, sent back to the hospital for evaluation.  On evaluation here, CT head showed resolution of findings from June, and Neurosurgery felt his imaging had reverted to his previous findings of atrophy, and that nothing was clinically apparent or concerning in imaging.  No WBC or fever.  No localizing signs, although his expressive and physical repertroire is limited.  CXR clear, CT abdomen and pelvis without new focal finding.    There was a report of hematuria observed prior to admission, but UA without blood.    Here, psychiatry were consulted, and they gave no particular recommendations.  The hospitalist service titrated up his Seroquel (he was table on this prior to admission QHS) and his symptoms stabilized. - Continue Seroquel TID - Discharge to memory care with Palliative care or Hospice following

## 2023-01-31 NOTE — Assessment & Plan Note (Signed)
-   Defer CPAP given agitation

## 2023-01-31 NOTE — TOC Initial Note (Signed)
Transition of Care [facility]) - Initial/Assessment Note    Patient Details  Name: Omar Kennedy MRN: [id number] Date of Birth: 1946/07/16  Transition of Care [facility]) CM/SW Contact:    Deatra Robinson, [place] Phone Number: 01/31/2023, 11:34 AM  Clinical Narrative:  pt admitted from [facility] where he is a STR resident. Spoke to [place] in admissions who confirmed pt able to return pending new auth. PT rec for SNF noted. Pt received haldol injection this morning. Will need to be free from chemical and physical restraints for 24 hours prior to dc to SNF. Palliative Care consult pending. Will start auth when appropriate.   Dellie Burns, MSW, LCSW [phone] (coverage)                   Expected Discharge Plan: Skilled Nursing Facility Barriers to Discharge: Continued Medical Work up, English as a second language teacher   Patient Goals and CMS Choice            Expected Discharge Plan and Services       Living arrangements for the past 2 months: Skilled Nursing Facility, Single Family Home                                      Prior Living Arrangements/Services Living arrangements for the past 2 months: Skilled Nursing Facility, Single Family Home Lives with:: Facility Resident          Need for Family Participation in Patient Care: Yes (Comment) Care giver support system in place?: Yes (comment)      Activities of Daily Living Home Assistive Devices/Equipment: Other (Comment) (pt resides in nursing home) ADL Screening (condition at time of admission) Patient's cognitive ability adequate to safely complete daily activities?: No Is the patient deaf or have difficulty hearing?: No Does the patient have difficulty seeing, even when wearing glasses/contacts?: No Does the patient have difficulty concentrating, remembering, or making decisions?: Yes Patient able to express need for assistance with ADLs?: No Does the patient have difficulty dressing or bathing?:  Yes Independently performs ADLs?: No Communication: Dependent Dressing (OT): Needs assistance Grooming: Needs assistance Feeding: Needs assistance Bathing: Needs assistance Toileting: Dependent In/Out Bed: Needs assistance Walks in Home: Needs assistance Does the patient have difficulty walking or climbing stairs?: Yes Weakness of Legs: Both Weakness of Arms/Hands: Both  Permission Sought/Granted                  Emotional Assessment       Orientation: : Fluctuating Orientation (Suspected and/or reported Sundowners), Oriented to Self Alcohol / Substance Use: Not Applicable Psych Involvement: No (comment)  Admission diagnosis:  Altered mental status, unspecified altered mental status type [R41.82] AMS (altered mental status) [R41.82] Patient Active Problem List   Diagnosis Date Noted   Hematuria 01/31/2023   AMS (altered mental status) 01/30/2023   Dementia with behavioral disturbance (HCC) 01/30/2023   Protein-calorie malnutrition, severe 01/18/2023   Fall from height of greater than 3 feet 01/04/2023   Mixed hyperlipidemia 12/29/2019   Hilar adenopathy 12/29/2019   Dehydration 12/29/2019   Unexplained weight loss 12/29/2019   Aspiration pneumonia of right lower lobe due to gastric secretions (HCC) 12/29/2019   Acute metabolic encephalopathy 12/29/2019   CAP (community acquired pneumonia) 12/29/2019   Sleep apnea 09/26/2019   OSA (obstructive sleep apnea) 08/03/2019   Nocturia more than twice per night 08/03/2019   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   Vivid dream 08/03/2019  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   Essential hypertension 08/12/2017   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   Coronary artery disease involving native coronary artery of native heart without angina pectoris 05/27/2017   Elevated blood-pressure reading without diagnosis of hypertension 12/10/2015   Hypercholesterolemia 12/10/2015   Memory loss, short term 12/10/2015   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 [place] [address] Phone: [phone] Fax: [phone]     Social Determinants of Health (SDOH) Social History: SDOH Screenings   Food Insecurity: Patient Unable To Answer (01/31/2023)  Housing: High Risk (01/31/2023)  Transportation Needs: Patient Unable To Answer (01/31/2023)  Utilities: Patient Unable To Answer (01/31/2023)  Tobacco Use: Medium Risk (01/31/2023)   SDOH Interventions:     Readmission Risk Interventions     No data to display

## 2023-01-31 NOTE — Assessment & Plan Note (Addendum)
-   Okay to continue Crestor, Coreg - Hold aspirin

## 2023-01-31 NOTE — Hospital Course (Addendum)
Omar Kennedy is a 77 y.o. M with dementia, lived at home until 1 month ago, fell into his pool, admitted to Trauma (see below) also CAD s/p CABG, HTN, and OSA on CPAP who presented with agitated behaviors from SNF.

## 2023-01-31 NOTE — Assessment & Plan Note (Signed)
Continue Crestor 

## 2023-01-31 NOTE — Assessment & Plan Note (Addendum)
See my note from 7/17.  Unable to cooperate with sling.

## 2023-01-31 NOTE — Progress Notes (Signed)
Patient has order for  Recruitment consultant.  No sitter provided at the beginning of this shift

## 2023-01-31 NOTE — Evaluation (Signed)
Physical Therapy Evaluation Patient Details Name: Omar Kennedy MRN: [id number] DOB: 22-Apr-1946 Today's Date: 01/31/2023  History of Present Illness  77 yo male admitted 7/12 from [facility] with AMS and hematuria. PMhx:  01/04/23-7/5 admission after falling into empty pool; Sustained R scalp laceration, T11 fx, multiple TVP fxs, SAH/ SDH, R chest wall hematoma, R clavicle fx (NWB, sling), L scapula fx (WBAT), R rib fxs 5-6. dementia, CAD x/p CABG, HTN, HLD, OSAP  Clinical Impression  Pt pleasantly confused able to state name and "yes ma'am". Pt able to walk with multimodal cues to initiate and progress mobility with assist for balance and direction. Pt with no awareness of precautions from last admission and no sling or brace present. Pt with decreased strength, cognition and mobility who will benefit from acute therapy to maximize function and safety. Pt would benefit from daily ambulation with mobility/nursing.      Assistance Recommended at Discharge Frequent or constant Supervision/Assistance  If plan is discharge home, recommend the following:  Can travel by private vehicle  A little help with walking and/or transfers;A lot of help with bathing/dressing/bathroom;Assistance with cooking/housework;Direct supervision/assist for medications management;Assist for transportation;Assistance with feeding;Direct supervision/assist for financial management;Help with stairs or ramp for entrance   Yes    Equipment Recommendations None recommended by PT  Recommendations for Other Services       Functional Status Assessment Patient has had a recent decline in their functional status and/or demonstrates limited ability to make significant improvements in function in a reasonable and predictable amount of time     Precautions / Restrictions Precautions Precautions: Back;Fall Precaution Comments: pt had sling and spinal brace last admission without either present currently and pt unable to  recall, retain or follow cues for precautions Restrictions RUE Weight Bearing: Non weight bearing LUE Weight Bearing: Weight bearing as tolerated Other Position/Activity Restrictions: pt lying on RUE hooked under head on arrival. max cues with mobility to limit use of pushing with RUE as pt unable to recall      Mobility  Bed Mobility Overal bed mobility: Needs Assistance Bed Mobility: Supine to Sit     Supine to sit: Min assist     General bed mobility comments: min assist to limit use of RUE with limited assist to elevate trunk, increased time to scoot to EOB with therapist holding Rt hand to prevent pushing    Transfers Overall transfer level: Needs assistance   Transfers: Sit to/from Stand Sit to Stand: Min assist           General transfer comment: min assist to rise from bed and toilet with cues for safety and assist to rise    Ambulation/Gait Ambulation/Gait assistance: Min assist Gait Distance (Feet): 80 Feet Assistive device: 1 person hand held assist Gait Pattern/deviations: Step-through pattern, Decreased stride length, Narrow base of support   Gait velocity interpretation: <1.8 ft/sec, indicate of risk for recurrent falls   General Gait Details: multimodal cues for direction and progression, pt able to reports fatigue and need to turn  Stairs            Wheelchair Mobility     Tilt Bed    Modified Rankin (Stroke Patients Only)       Balance Overall balance assessment: History of Falls   Sitting balance-Leahy Scale: Fair     Standing balance support: Single extremity supported, During functional activity Standing balance-Leahy Scale: Poor Standing balance comment: UE support  Pertinent Vitals/Pain Pain Assessment Pain Assessment: PAINAD Breathing: normal Negative Vocalization: none Facial Expression: smiling or inexpressive Body Language: relaxed Consolability: no need to console PAINAD  Score: 0    Home Living Family/patient expects to be discharged to:: Skilled nursing facility Living Arrangements: Spouse/significant other Available Help at Discharge: Family;Available 24 hours/day Type of Home: House Home Access: Stairs to enter   [employer] of Steps: 2   Home Layout: Two level;Able to live on main level with bedroom/bathroom Home Equipment: None Additional Comments: last admission from home with wife who is currently using a wC per last admission as pt unable to provide PLOF. Pt admitted from SNF    Prior Function Prior Level of Function : Patient poor historian/Family not available             Mobility Comments: unsure but was ambulatory       Hand Dominance        Extremity/Trunk Assessment   Upper Extremity Assessment RUE Deficits / Details: no sling present with max cues not to push with RUE    Lower Extremity Assessment Lower Extremity Assessment: Generalized weakness    Cervical / Trunk Assessment Cervical / Trunk Assessment: Kyphotic  Communication      Cognition Arousal/Alertness: Awake/alert Behavior During Therapy: Flat affect Overall Cognitive Status: History of cognitive impairments - at baseline                                 General Comments: pt without family present and hx of dementia. pt able to state name but not able to reply to any other orientation or hx questions. follows single step commands inconsistently with increased time and benefits from multimodal cueing        General Comments      Exercises     Assessment/Plan    PT Assessment Patient needs continued PT services  PT Problem List Decreased balance;Decreased mobility;Pain;Decreased cognition;Decreased knowledge of use of DME;Decreased safety awareness;Decreased knowledge of precautions;Decreased activity tolerance;Decreased strength;Decreased coordination       PT Treatment Interventions DME instruction;Gait  training;Functional mobility training;Therapeutic activities;Therapeutic exercise;Balance training;Neuromuscular re-education;Cognitive remediation;Patient/family education    PT Goals (Current goals can be found in the Care Plan section)  Acute Rehab PT Goals Patient Stated Goal: none stated PT Goal Formulation: Patient unable to participate in goal setting Time For Goal Achievement: 02/14/23 Potential to Achieve Goals: Fair    Frequency Min 1X/week     Co-evaluation               AM-PAC PT "6 Clicks" Mobility  Outcome Measure Help needed turning from your back to your side while in a flat bed without using bedrails?: A Little Help needed moving from lying on your back to sitting on the side of a flat bed without using bedrails?: A Little Help needed moving to and from a bed to a chair (including a wheelchair)?: A Little Help needed standing up from a chair using your arms (e.g., wheelchair or bedside chair)?: A Little Help needed to walk in hospital room?: A Little Help needed climbing 3-5 steps with a railing? : Total 6 Click Score: 16    End of Session Equipment Utilized During Treatment: Gait belt Activity Tolerance: Patient tolerated treatment well Patient left: in chair;with call bell/phone within reach;with chair alarm set Nurse Communication: Mobility status PT Visit Diagnosis: History of falling (Z91.81);Difficulty in walking, not elsewhere classified (R26.2);Other abnormalities of gait  and mobility (R26.89)    Time: 6440-3474 PT Time Calculation (min) (ACUTE ONLY): 18 min   Charges:   PT Evaluation $PT Eval Moderate Complexity: 1 Mod   PT General Charges $$ ACUTE PT VISIT: 1 Visit         Merryl Hacker, PT Acute Rehabilitation Services Office: [phone]   Enedina Finner Iver Miklas 01/31/2023, 9:46 AM

## 2023-01-31 NOTE — Progress Notes (Signed)
Orthopedic Tech Progress Note Patient Details:  Omar Kennedy [birth year] [id number]  Ortho Devices Type of Ortho Device: Shoulder immobilizer Ortho Device/Splint Location: RUE Ortho Device/Splint Interventions: Ordered     Sling left at bedside with sitter, as the patient had received medicine to help him sleep not too long ago and he was asleep upon my arrival.  [place] 01/31/2023, 12:08 PM

## 2023-01-31 NOTE — Assessment & Plan Note (Addendum)
BP normal   - Continue amlodipine, Coreg, lisinopril  - PRN hydralazine

## 2023-01-31 NOTE — Assessment & Plan Note (Addendum)
-   Continue Exelon - Continue Seroquel now TID - Continue Lexapro - Stop Megace (started by Trauma service) given association with increased risk of death and superiority of atypical antipsychotics for weight gain

## 2023-01-31 NOTE — Assessment & Plan Note (Addendum)
Fell into pool 1 month ago.  Found next morning and admitted as level 1 trauma.    Found ultimately to have hemothorax, small pneumothorax not requiring chest tube, multiple right rib fractures, right clavicle fracture, multiple thoracic and lumbar spine transverse process fractures, and subdural hematomas   Since the fall, as above his mentation has been considerably worse - Consult palliative care

## 2023-01-31 NOTE — Progress Notes (Signed)
Progress Note   Patient: Omar Kennedy CWC:[id number] DOB: Apr 12, 1946 DOA: 01/29/2023     1 DOS: the patient was seen and examined on 01/31/2023 at 10:38 AM      Brief hospital course: Omar Kennedy is a 77 y.o. M with dementia, lived at home until 1 month ago, fell into his pool, was in there overnight, admitted to Trauma service after (found to have hematomas, multiple thoracic and lumbar spine transverse process fractures, small pneumothorax and hemothorax not requiring chest tube, several right rib fractures, and right clavicle fracture), also CAD s/p CABG, HTN, and OSA on CPAP who presented with several days of increased weakness, agitation, and gross hematuria.  In the ER, CT head showed enlarged ventricles, no other acute findings.  Urine showed THC, no opiates, no hematuria.  Otherwise electrolytes and renal function fine.     Assessment and Plan: * Acute metabolic encephalopathy Patient with advanced dementia (SLUMS 18/30 in 2021, MMSE 13/30 in 01/2022) prior to fall, significantly worse since fall/multitrauma in June.  On evaluation here, CT head shows resolution of findings from June, and Neurosurgery feel he has reverted to his previous findings of atrophy, and that nothing is clinically apparent from his imaging.  No WBC or fever.  No localizing signs, although his expressive and physical repertroire is limited.  CXR clear, CT abdomen and pelvis without new focal finding.    There was report of hematuria, but this is ruled out.  His MAR at the outside facility will be reviewed, but to this point it is not clear that a medicine has caused him to be less responsive and more confused.  - PT/OT - Palliative Care    Right clavicle fracture - Sling - NWB right arm  Dementia with behavioral disturbance (HCC) - Continue Exelon - Continue Seroquel (stable on this prior to fall) - Continue Lexapro - Continue Megace  Fall from height of greater than 3 feet Fell into pool 1  month ago.  Found next morning and admitted as level 1 trauma.    Mixed hyperlipidemia - Continue Crestor  OSA (obstructive sleep apnea) - Defer CPAP given agitation  Essential hypertension BP slightly high - Continue amlodipine, Coreg - Resume lisinopril - PRN hydralazine  Coronary artery disease involving native coronary artery of native heart without angina pectoris - Continue Crestor - Cotninue Coreg - Hold aspirin           Subjective: Overnight patient was agitated, required Haldol and 9 PM and then again this morning at 9 AM but due to restlessness, impulsiveness, and combativeness.  No fever, no respiratory symptoms, no nursing observations of cough, other localizing symptoms.     Physical Exam: BP (!) 143/77 (BP Location: Left Arm)   Pulse 88   Temp 97.9 F (36.6 C)   Resp 16   Ht 5\' 10"  (1.778 m)   Wt 77.1 kg   SpO2 95%   BMI 24.39 kg/m   Frail elderly adult male, lying in bed, very somnolent, opens eyes briefly then closes them and mostly does not cooperate with exam RRR, no murmurs, no peripheral edema Respiratory rate seems normal, no rales or wheezes appreciated although he does not cooperate with exam. Abdomen soft, no rigidity, no grimace to palpation Attention diminished, eyes closed, pupils equal and reactive, face symmetric, moves upper extremities bilaterally with generalized weakness but symmetric strength, speech exhausted but no dysarthria.  Later observed shuffling gait with physical therapy    Data Reviewed: Comprehensive metabolic panel normal  CBC unremarkable Urinalysis no red cells  Family Communication: Wife by phone    Disposition: Status is: Inpatient         Author: Alberteen Sam, MD 01/31/2023 12:20 PM  For on call review [email or web address].

## 2023-02-01 DIAGNOSIS — R52 Pain, unspecified: Secondary | ICD-10-CM

## 2023-02-01 DIAGNOSIS — F03918 Unspecified dementia, unspecified severity, with other behavioral disturbance: Secondary | ICD-10-CM | POA: Diagnosis not present

## 2023-02-01 DIAGNOSIS — I2510 Atherosclerotic heart disease of native coronary artery without angina pectoris: Secondary | ICD-10-CM | POA: Diagnosis not present

## 2023-02-01 DIAGNOSIS — I10 Essential (primary) hypertension: Secondary | ICD-10-CM | POA: Diagnosis not present

## 2023-02-01 DIAGNOSIS — G9341 Metabolic encephalopathy: Secondary | ICD-10-CM | POA: Diagnosis not present

## 2023-02-01 DIAGNOSIS — Z515 Encounter for palliative care: Secondary | ICD-10-CM | POA: Diagnosis not present

## 2023-02-01 MED ORDER — HALOPERIDOL LACTATE 5 MG/ML IJ SOLN
5.0000 mg | Freq: Once | INTRAMUSCULAR | Status: DC | PRN
  Filled 2023-02-01: qty 1

## 2023-02-01 MED ORDER — HALOPERIDOL LACTATE 5 MG/ML IJ SOLN
5.0000 mg | Freq: Once | INTRAMUSCULAR | Status: AC | PRN
  Administered 2023-02-01: 5 mg via INTRAMUSCULAR

## 2023-02-01 MED ORDER — ACETAMINOPHEN 325 MG PO TABS
650.0000 mg | ORAL_TABLET | Freq: Four times a day (QID) | ORAL | Status: DC
  Administered 2023-02-01: 650 mg via ORAL
  Filled 2023-02-01: qty 2

## 2023-02-01 MED ORDER — ACETAMINOPHEN 325 MG PO TABS
650.0000 mg | ORAL_TABLET | Freq: Three times a day (TID) | ORAL | Status: DC
  Administered 2023-02-01 – 2023-02-07 (×16): 650 mg via ORAL
  Filled 2023-02-01 (×18): qty 2

## 2023-02-01 NOTE — TOC Progression Note (Signed)
Transition of Care [facility]) - Progression Note    Patient Details  Name: ROSE HIPPLER MRN: [id number] Date of Birth: 1945/12/12  Transition of Care [facility]) CM/SW Contact  Correy Weidner A [place], [place] Phone Number: 02/01/2023, 4:13 PM  Clinical Narrative:     CSW met with pt and pt's wife at bedside. [name] stated that she did not want pt to return to [facility]. She said that they were unable to give him the attention he needed with his dementia diagnosis. She said that they were looking to send pt to a facility in [place] where she and pt have family. She said that paperwork had been sent over to that facility and that she would update CSW with referral decision once it is known.   TOC will continue to follow.   Expected Discharge Plan: Skilled Nursing Facility Barriers to Discharge: Continued Medical Work up, English as a second language teacher  Expected Discharge Plan and Services       Living arrangements for the past 2 months: Skilled Nursing Facility, Single Family Home                                       Social Determinants of Health (SDOH) Interventions SDOH Screenings   Food Insecurity: Patient Unable To Answer (01/31/2023)  Housing: High Risk (01/31/2023)  Transportation Needs: Patient Unable To Answer (01/31/2023)  Utilities: Patient Unable To Answer (01/31/2023)  Tobacco Use: Medium Risk (01/31/2023)    Readmission Risk Interventions     No data to display

## 2023-02-01 NOTE — Plan of Care (Signed)

## 2023-02-01 NOTE — Progress Notes (Signed)
  Progress Note   Patient: Omar Kennedy GEX:[id number] DOB: [birth year] DOA: 01/29/2023     2 DOS: the patient was seen and examined on 02/01/2023 at 8:41 AM and 11:30 AM      Brief hospital course: Omar Kennedy is a 77 y.o. M with dementia, lived at home until 1 month ago, fell into his pool, was in there overnight, admitted to Trauma service after (found to have hematomas, multiple thoracic and lumbar spine transverse process fractures, small pneumothorax and hemothorax not requiring chest tube, several right rib fractures, and right clavicle fracture), also CAD s/p CABG, HTN, and OSA on CPAP who presented with several days of increased weakness, agitation, and gross hematuria.  In the ER, CT head showed enlarged ventricles, no other acute findings.  Urine showed THC, no opiates, no hematuria.  Otherwise electrolytes and renal function fine.     Assessment and Plan: * Acute metabolic encephalopathy See longer summary from yesterday -PT OT    Right clavicle fracture - Sling - NWB right arm  Dementia with behavioral disturbance (HCC) -Delirium precautions - Titrate Seroquel to 1 3 times daily - Continue Exelon - Continue Lexapro - Continue Megace  Fall from height of greater than 3 feet Fell into pool 1 month ago.  Found next morning and admitted as level 1 trauma.    Mixed hyperlipidemia - Continue Crestor  OSA (obstructive sleep apnea) - Defer CPAP given difficulty cooperating with other cares  Essential hypertension BP controlled - Continue amlodipine, Coreg, lisinopril  Coronary artery disease involving native coronary artery of native heart without angina pectoris - Continue Crestor - Cotninue Coreg - Hold aspirin           Subjective: Patient somewhat agitated overnight, requiring Haldol.  No fever.  No nursing concerns other than agitation.     Physical Exam: BP 125/65 (BP Location: Left Arm)   Pulse 80   Temp 98.3 F (36.8 C) (Oral)   Resp  18   Ht 5\' 10"  (1.778 m)   Wt 77.1 kg   SpO2 100%   BMI 24.39 kg/m   Elderly adult male, sleeping, opens eyes, sees me, then closes them again, looks away, mostly does not respond to my requests or questions RRR, no murmurs, no peripheral edema Respiratory rate normal, lungs clear without rales or wheezes Abdomen without grimace to palpation Inattentive, face symmetric, does not cooperate with my exam, although I observed him moving both upper extremities with generalized weakness at other times     Data Reviewed: Discussed with psychiatry       Disposition: Status is: Inpatient         Author: Alberteen Sam, MD 02/01/2023 1:57 PM  For on call review [email or web address].

## 2023-02-01 NOTE — Progress Notes (Addendum)
TRH night cross cover note:  I was notified by RN that this patient is agitated, pulling peripheral IV, with these behaviors refractory to attempts at verbal redirection.  In the setting of associated interference with ongoing medical treatment posing potential harm to themself, I have placed order for haldol 5 mg IV x 1 prn for agitation.     Update: IV access has been lost.  Consequently, I am converting his previously ordered prn IV Haldol to IM route of administration.  Update: refractory agitation noted. I've order geodon 20 IM x 1 dose prn.    Newton Pigg, DO Hospitalist

## 2023-02-01 NOTE — Progress Notes (Signed)
Patient ID: Omar Kennedy, male   DOB: 1945-08-29, 77 y.o.   MRN: [id number]    Progress Note from the Palliative Medicine Team at [facility]   Patient Name: Omar Kennedy        Date: 02/01/2023 DOB: 25-Jun-1946  Age: 77 y.o. MRN#: [id number] Attending Physician: Alberteen Sam, * Primary Care Physician: [facility] Admit Date: 01/29/2023    Extensive chart review has been completed prior to meeting with patient/family  including labs, vital signs, imaging, progress/consult notes, orders, medications and available advance directive documents.   77 y.o. male   admitted on 01/29/2023 with  with medical history significant of dementia, CAD s/p CABG, HTN, HLD, and OSA on CPAP presenting with hematuria and AMS.     He was last hospitalized from 6/17-7/5 after having been found down in an empty swimming pool, an estimated 10 foot fall.  He suffered a T11 fracture, multiple thoracic and lumbar spine transverse process fractures, SAH/SHD, R scalp laceration, R chest wall hematoma, R clavicle fracture, L scapula fracture, and multiple rib fractures with small PTX; he was discharged to SNF rehab.   Family report increased confusion through last week at SNF/[facility].  Family reports severe agitation   Admitted for treatment and stabilization.    Family face treatment option decisions, advanced directive decisions and anticipatory care needs.      This NP assessed patient at the bedside as a follow up to  yesterday's GOCs meeting, and to assess for palliative medicine needs and emotional support.  Patient is sleeping quietly, sitter at bedside.  Understanding his underlying agitation I did not awaken him.  Visitor reports that patient complains of pain when attempting to ambulate and move about in bed.   Nursing reports poor oral intake.  I spoke to patient's wife by telephone.  Continue conversation regarding goals of care and next steps in treatment plan.  Family is  still working on the possibility of transition to a facility in [place] closer to her daughter.  Wife tells me she is in conversation with transition of care team here at the hospital.   Plan of care -DNR/DNI -no artificial feeding now or in the future -Symptom management:     -Agitation-medications prescribed per attending, family is requesting psych consult     - added Tylenol 650 mg po every 8 hours scheduled for underlying pain  -Family is working on future placement at a facility in [place] -PMT will continue to support holistically    Education offered today regarding  the importance of continued conversation with family and their  medical providers regarding overall plan of care and treatment options,  ensuring decisions are within the context of the patients values and GOCs.  Questions and concerns addressed   Discussed with Dr Maryfrances Bunnell   Time: 50 minutes  Detailed review of medical records ( labs, imaging, vital signs), medically appropriate exam ( MS, skin, resp)   discussed with treatment team, counseling and education to patient, family, staff, documenting clinical information, medication management, coordination of care    Lorinda Creed NP  Palliative Medicine Team Team Phone # [phone] Pager [phone]

## 2023-02-02 DIAGNOSIS — I2510 Atherosclerotic heart disease of native coronary artery without angina pectoris: Secondary | ICD-10-CM | POA: Diagnosis not present

## 2023-02-02 DIAGNOSIS — I10 Essential (primary) hypertension: Secondary | ICD-10-CM | POA: Diagnosis not present

## 2023-02-02 DIAGNOSIS — G9341 Metabolic encephalopathy: Secondary | ICD-10-CM | POA: Diagnosis not present

## 2023-02-02 DIAGNOSIS — F03918 Unspecified dementia, unspecified severity, with other behavioral disturbance: Secondary | ICD-10-CM | POA: Diagnosis not present

## 2023-02-02 LAB — COMPREHENSIVE METABOLIC PANEL
ALT: 42 U/L (ref 0–44)
AST: 40 U/L (ref 15–41)
Albumin: 3.3 g/dL — ABNORMAL LOW (ref 3.5–5.0)
Alkaline Phosphatase: 140 U/L — ABNORMAL HIGH (ref 38–126)
Anion gap: 7 (ref 5–15)
BUN: 12 mg/dL (ref 8–23)
CO2: 21 mmol/L — ABNORMAL LOW (ref 22–32)
Calcium: 9.1 mg/dL (ref 8.9–10.3)
Chloride: 110 mmol/L (ref 98–111)
Creatinine, Ser: 1.06 mg/dL (ref 0.61–1.24)
GFR, Estimated: >60 mL/min (ref >60)
Glucose, Bld: 108 mg/dL — ABNORMAL HIGH (ref 70–99)
Potassium: 4 mmol/L (ref 3.5–5.1)
Sodium: 138 mmol/L (ref 135–145)
Total Bilirubin: 2 mg/dL — ABNORMAL HIGH (ref 0.3–1.2)
Total Protein: 5.6 g/dL — ABNORMAL LOW (ref 6.5–8.1)

## 2023-02-02 LAB — AMMONIA: Ammonia: 42 umol/L — ABNORMAL HIGH (ref 9–35)

## 2023-02-02 MED ORDER — ZIPRASIDONE MESYLATE 20 MG IM SOLR
20.0000 mg | Freq: Once | INTRAMUSCULAR | Status: AC | PRN
  Administered 2023-02-02: 20 mg via INTRAMUSCULAR
  Filled 2023-02-02: qty 20

## 2023-02-02 MED ORDER — QUETIAPINE FUMARATE 25 MG PO TABS
25.0000 mg | ORAL_TABLET | Freq: Three times a day (TID) | ORAL | Status: DC
  Administered 2023-02-02 – 2023-02-07 (×15): 25 mg via ORAL
  Filled 2023-02-02 (×15): qty 1

## 2023-02-02 NOTE — Progress Notes (Signed)
Nutrition Follow-up  DOCUMENTATION CODES:   Not applicable  INTERVENTION:  - Continue Ensure Enlive po BID, each supplement provides 350 kcal and 20 grams of protein.  NUTRITION DIAGNOSIS:   Inadequate oral intake related to lethargy/confusion as evidenced by meal completion < 25%.  GOAL:   Patient will meet greater than or equal to 90% of their needs - Not met.   MONITOR:   PO intake, Supplement acceptance  REASON FOR ASSESSMENT:   Consult Assessment of nutrition requirement/status  ASSESSMENT:   77 y.o. male admits related to AMS. PMH includes: dementia, CAD s/p CABG, HTN, HLD, OSA on CPAP. Pt is currently receiving medical management related to acute metabolic encephalopathy.  Meds reviewed: colace, megace, senokot. Labs reviewed: alk phos elevated.   Calorie count ordered. RD collected calorie count folder and only 3 tickets with 0% written on 2 and nothing written on the last. Pt is not meeting his needs at this time. Discussed plan with MD. Palliative care is following. Pt is DNR/DNI and family not desiring alternate means of nutrition at this time. Pt has Ensure BID currently ordered. Will continue to monitor POC.   Diet Order:   Diet Order             Diet regular Room service appropriate? Yes; Fluid consistency: Thin  Diet effective now                   EDUCATION NEEDS:   Not appropriate for education at this time  Skin:  Skin Assessment: Reviewed RN Assessment  Last BM:  7/15 - type 6  Height:   Ht Readings from Last 1 Encounters:  01/29/23 5\' 10"  (1.778 m)    Weight:   Wt Readings from Last 1 Encounters:  02/02/23 62 kg    Ideal Body Weight:     BMI:  Body mass index is 19.61 kg/m.  Estimated Nutritional Needs:   Kcal:  1930-2300 kcals  Protein:  95-115 gm  Fluid:  >/= 2 L  Bethann Humble, RD, LDN, CNSC.

## 2023-02-02 NOTE — Progress Notes (Addendum)
Progress Note   Patient: Omar Kennedy QIH:[id number] DOB: [birth year] DOA: 01/29/2023     3 DOS: the patient was seen and examined on 02/02/2023 at 9:25AM      Brief hospital course: Mr. Milks is a 77 y.o. M with dementia, lived at home until 1 month ago, fell into his pool, was in there overnight, admitted to Trauma service after (found to have hematomas, multiple thoracic and lumbar spine transverse process fractures, small pneumothorax and hemothorax not requiring chest tube, several right rib fractures, and right clavicle fracture), also CAD s/p CABG, HTN, and OSA on CPAP who presented with several days of increased weakness, agitation, and gross hematuria.  In the ER, CT head showed enlarged ventricles, no other acute findings.  Urine showed THC, no opiates, no hematuria.  Otherwise electrolytes and renal function fine.     Assessment and Plan: * Acute metabolic encephalopathy Agitation Dementia See my longer summary from 7/14 note.    Had been stable on Seroquel nightly prior to trauma in June.  While in the hospital last month, was stable on Exelon, at bedtime seroquel, Ketorolac and nightly IM Haldol 2mg .  Seroquel titrated up to TID two evenings ago.    The last two nights, has required both IM Haldol and IM Geodon   Unfortunately, staff maintain blinds closed all day, patient slept all day last two days. - Consult Psychiatry - Continue Seroquel TID - Continue scheduled acetaminophen - Standard delirium precautions: blinds open and lights on during day, TV off, minimize interruptions at night, glasses/hearing aids, PT/OT, avoiding Beers list medications     - Continue Exelon, Lexapro, Megace    Fall from height of greater than 3 feet Fell into pool 1 month ago.  Found next morning and admitted as level 1 trauma.    Right clavicle fracture - Maintain arm in Sling - NWB right arm  Mixed hyperlipidemia - Continue Crestor  OSA (obstructive sleep apnea) - Defer  CPAP given agitation  Essential hypertension BP controlled - Continue amlodipine, Coreg, lisinopril   Coronary artery disease involving native coronary artery of native heart without angina pectoris - Continue Crestor - Continue Coreg - Hold aspirin           Subjective: No change.  Patient does not engatge with me.  He looks at me, mumbles some things softly, then closes eyes and does not follow commands.  No nuyrsing concerns other than agitation overnight requiring Haldol and Geodon.     Physical Exam: BP (!) 118/56 (BP Location: Right Arm)   Pulse 69   Temp 98.6 F (37 C) (Axillary)   Resp 16   Ht 5\' 10"  (1.778 m)   Wt 62 kg   SpO2 100%   BMI 19.61 kg/m   Thin adult male, lying in bed, sleeping.  Rouses sluggishly, then goes back to sleep RRR no murmurs, no LE edema RR normal, no snoring, lungs clear, no rales or wheezes, although exma limited by cooperation Abdomen soft, no distension, no grimace to palpation or guarding Attention diminished, memory loss intense.  Strength not tested due to cooperation.  No reports of weakness or neurological change from nursing.  Data Reviewed: None new      Disposition: Status is: Inpatient Patient admitted for agitation (initially sent for "hematuria" but I suspect this was a pretext on the part of SNF).  In point of fact, he is a fall risk and is unable to cooperate with mobility limitations and so becomes agitated.  Conservative measurs, family and redirection have failed.  In order to be able to discharge to SNF and cooperate with rehab, he will need a medication regimen that enables him to be more cooperative.    Psychiatry are consulted to assist with this.  Appreciate their assistance.        Author: Alberteen Sam, MD 02/02/2023 9:32 AM  For on call review [email or web address].

## 2023-02-02 NOTE — Plan of Care (Signed)

## 2023-02-02 NOTE — TOC Progression Note (Signed)
Transition of Care [facility]) - Progression Note    Patient Details  Name: Omar Kennedy MRN: [id number] Date of Birth: 20-May-1946  Transition of Care [facility]) CM/SW Contact  Lannah Koike A [place], [place] Phone Number: 02/02/2023, 4:47 PM  Clinical Narrative:     CSW met with pt's wife and sister in law at bedside. They stated that pt had a confirmed bed at [facility] in [place].  They said that it was an unsafe discharge for home due to pt and wife living alone with no other family nearby and [name] currently being limited with her mobility.   CSW informed them that pt may need to go to facility as pt cannot stay past his medically stability which could be Thursday.   They stated they were not against referral to a facility in the area until pt could get transferred but said that they felt the safest option would be for pt to stay until the scheduled ambulance transportation on Monday morning could be facilitated by the company they have hired.   Pt to be faxed out with possible plan when pt is medically stable to facility before transfer.   CSW informed them paperwork to facility would need to be sent as well and TOC is in communication with facility to send paperwork.   TOC will continue to follow.   Expected Discharge Plan: Skilled Nursing Facility Barriers to Discharge: Continued Medical Work up, English as a second language teacher  Expected Discharge Plan and Services       Living arrangements for the past 2 months: Skilled Nursing Facility, Single Family Home                                       Social Determinants of Health (SDOH) Interventions SDOH Screenings   Food Insecurity: Patient Unable To Answer (01/31/2023)  Housing: High Risk (01/31/2023)  Transportation Needs: Patient Unable To Answer (01/31/2023)  Utilities: Patient Unable To Answer (01/31/2023)  Tobacco Use: Medium Risk (01/31/2023)    Readmission Risk Interventions     No data to display

## 2023-02-02 NOTE — Consult Note (Addendum)
[name] is a 77 y.o. M with dementia, lived at home until 1 month ago, fell into his pool, was in there overnight, admitted to Trauma service after (found to have hematomas, multiple thoracic and lumbar spine transverse process fractures, small pneumothorax and hemothorax not requiring chest tube, several right rib fractures, and right clavicle fracture), also CAD s/p CABG, HTN, and OSA on CPAP who presented with several days of increased weakness, agitation, and gross hematuria.   In the ER, CT head showed enlarged ventricles, no other acute findings.  Urine showed THC, no opiates, no hematuria.  Otherwise electrolytes and renal function fine.  Psychiatry consulted for admission to geropsych and titration of medications for agitated dementia.  Prior to psychiatric consult primary team has started quetiapine 25 mg p.o. 3 times daily for management of agitation.  Following 3-4 consecutive doses of quetiapine patient remained agitated and aggressive.  Although not documented charge nurse reports patient was striking and swinging at nursing staff, overnight which resulted in him receiving Geodon 20 mg as needed for agitation, and Haldol 5 mg as needed for agitation.  Patient very agitated and aggressive last night despite having a sitter at the bedside. Per staff, patient very difficulty to redirect. Patient has hx of early onset Dementia probable sundowning as well versus delirium from hospitalization.  Given the risk for injury to self and others, I will start low-dose Depakote via IV for management of behaviors. Will recommend refraining from use of multiple antipsychotics in a fragile demented patient.  Appears that he may benefit from adjusting timing of Seroquel in the evening.  Also agreed with administration of escitalopram 5 mg p.o. daily, recent research has shown it has been effective in managing agitated behavior in demented patients.  Patient awake and alert this morning. No behavioral  disturbances observed while with this patient. Patient unable to participate in a full psychiatric exam.   Would avoid restraint at this time as this may make his neuropsychiatric symptoms worse.   EKG obtained on January 29, 2023, QTc 451.  -Continue delirium precautions. -Continue current dose of quetiapine 25 mg p.o. 3 times daily.  Will adjust time of administration to target sundowners. Refrain from multiple antipsychotics and benzodiazepines as they can contribute to worsening neuropsychiatric symptoms.  Will obtain CMP and ammonia level prior to starting IV Depakote. -Once labs return and determined to be within normal limits recommend starting Depakote IV 250 mg twice daily.  Will not treat to therapeutic goal, more so management of agitated behaviors. -It is essential to nursing staff continue to document these behaviors. Psychiatric consult service will continue to follow.  Due to dementia diagnosis as a main exclusion criteria, patient does not meet criteria for inpatient psychiatric hospitalization. He would benefit from Memory care unit for better management of dementia and disease progression.

## 2023-02-02 NOTE — TOC Progression Note (Signed)
Transition of Care [facility]) - Progression Note    Patient Details  Name: REEDER BRISBY MRN: [id number] Date of Birth: 1946-06-27  Transition of Care [facility]) CM/SW Contact  Abas Leicht A [place], [place] Phone Number: 02/02/2023, 4:54 PM  Clinical Narrative:     CSW spoke with Vernell Barrier, [phone], assistant Executive Director at [facility].   She confirmed with CSW that pt was accepted to facility and that they are prepared to accept pt. CSW would need to provide Patient Care Plan form with physician or NP signature as well as H&P, updated MAR and progress notes.   CSW asked about pt possibly being discharged to facility earlier than Monday as family had possibility of obtaining transportation then.   She stated that while they could accept pt during this week if medically stable, the transportation company the family has utilized is not available until Sunday or Monday because of the type of medical services they would need. She stated that Thursday or Friday would be possible but weekend discharge could make getting pt's medication etc across state lines more challenging.   CSW stated form for provider and needed paperwork would be sent over and that the treatment team would discuss the plan for discharge and medical necessity and update facility.   CSW will inquire with family regarding other possible transportation options if current transportation plan is a barrier for discharge.   TOC will continue to follow.      Expected Discharge Plan: Skilled Nursing Facility Barriers to Discharge: Continued Medical Work up, English as a second language teacher  Expected Discharge Plan and Services       Living arrangements for the past 2 months: Skilled Nursing Facility, Single Family Home                                       Social Determinants of Health (SDOH) Interventions SDOH Screenings   Food Insecurity: Patient Unable To Answer (01/31/2023)  Housing: High Risk (01/31/2023)   Transportation Needs: Patient Unable To Answer (01/31/2023)  Utilities: Patient Unable To Answer (01/31/2023)  Tobacco Use: Medium Risk (01/31/2023)    Readmission Risk Interventions     No data to display

## 2023-02-02 NOTE — TOC Progression Note (Signed)
Transition of Care [facility]) - Progression Note    Patient Details  Name: Omar Kennedy MRN: [id number] Date of Birth: 1946/04/12  Transition of Care [facility] [place] [facility]) CM/SW Contact  Izabela Ow A [place], [place] Phone Number: 02/02/2023, 9:37 AM  Clinical Narrative:     CSW received phone call from liaison, Saline, [phone]. She said that she and family are in communication with an Assisted Living Facility with Memory Care unit in [place], [place] called [facility]. She stated that they have tentatively accepted pt but need a few more clinical details and plan to reach out to pt's nursing here. CSW provided the contact information for nurses station to [place] to provide to facility.   She also stated that facility is requesting potential scheduled discharge Sunday or Monday so that a full staff can accommodate pt admission. Ambulance transport will be scheduled by family through an interstate transportation company that Dorathy Daft has provided contact information.    She is sending a physician from to CSW for attending provider to sign.   She also informed CSW that family agreed for Hospice orders to be placed. CSW will notify Palliative team regarding pt care update.  She said that they are in communication with a couple of hospice companies in the [place] area and will decide which company to go with once pt has arrived.    CSW will follow up with [facility] regarding potential DC once treatment team has been notified.   TOC will continue to follow.   Expected Discharge Plan: Skilled Nursing Facility Barriers to Discharge: Continued Medical Work up, English as a second language teacher  Expected Discharge Plan and Services       Living arrangements for the past 2 months: Skilled Nursing Facility, Single Family Home                                       Social Determinants of Health (SDOH) Interventions SDOH Screenings   Food Insecurity: Patient Unable To Answer (01/31/2023)  Housing: High Risk  (01/31/2023)  Transportation Needs: Patient Unable To Answer (01/31/2023)  Utilities: Patient Unable To Answer (01/31/2023)  Tobacco Use: Medium Risk (01/31/2023)    Readmission Risk Interventions     No data to display

## 2023-02-02 NOTE — Progress Notes (Signed)
2143 Patient agitated and repeatedly attempted to get out of bed. Recruitment consultant and RN unable to redirect. MD made aware. PRN Haldol given with improvement. Patient calm and resting in bed with alarm activated.   2956 Patient became more agitated repeatedly attempting to get out of bed. PRN Geodon given with effectiveness. Patient is resting in bed at this time. Safety sitter ordered, no sitter available at this time. Bed alarm activated.

## 2023-02-02 NOTE — Progress Notes (Signed)
Patient ID: Omar Kennedy, male   DOB: 01/14/1946, 77 y.o.   MRN: [id number]    Progress Note from the Palliative Medicine Team at [facility]   Patient Name: Omar Kennedy        Date: 02/02/2023 DOB: 12/26/1945  Age: 77 y.o. MRN#: [id number] Attending Physician: Alberteen Sam, * Primary Care Physician: [facility] Admit Date: 01/29/2023    Extensive chart review has been completed prior to meeting with patient/family  including labs, vital signs, imaging, progress/consult notes, orders, medications and available advance directive documents.   77 y.o. male   admitted on 01/29/2023 with  with medical history significant of dementia, CAD s/p CABG, HTN, HLD, and OSA on CPAP presenting with hematuria and AMS.     He was last hospitalized from 6/17-7/5 after having been found down in an empty swimming pool, an estimated 10 foot fall.  He suffered a T11 fracture, multiple thoracic and lumbar spine transverse process fractures, SAH/SHD, R scalp laceration, R chest wall hematoma, R clavicle fracture, L scapula fracture, and multiple rib fractures with small PTX; he was discharged to SNF rehab.   Family report increased confusion through last week at SNF/[facility].  Family reports severe agitation   Admitted for treatment and stabilization.    Family face treatment option decisions, advanced directive decisions and anticipatory care needs.        This NP assessed patient at the bedside as a follow up  for palliative medicine needs and emotional support and to meet with wife and her sister for ongoing conversations regarding treatment plan.  Patient is resting quietly, sitter at bedside.  He is eating potato chips and drinking water   Continue conversation regarding goals of care and next steps in treatment plan.  Family is still working on the transition plan to a facility in [place].  He is in close communication with facility, transport services transition of  care team here at the hospital.  Plan is if patient is medically stable transport will take place on Monday   Plan of care -DNR/DNI -no artificial feeding now or in the future -Symptom management:     -Agitation-see psych recommendations     -  Tylenol 650 mg po every 8 hours scheduled for underlying pain  -MOST form completed, hard copy in chart--currently limited interventions, however once patient arrives in [place] family plans to shift to a full comfort path and activate his hospice benefit -Family is working on future placement at a facility in [place] -PMT will continue to support holistically   Education offered today regarding  the importance of continued conversation with family and their  medical providers regarding overall plan of care and treatment options,  ensuring decisions are within the context of the patients values and GOCs.  Questions and concerns addressed   Discussed with Dr Maryfrances Bunnell and [facility] team    Time: 55 minutes  Detailed review of medical records ( labs, imaging, vital signs), medically appropriate exam ( MS, skin, resp)   discussed with treatment team, counseling and education to patient, family, staff, documenting clinical information, medication management, coordination of care    Lorinda Creed NP  Palliative Medicine Team Team Phone # [phone] Pager [phone]

## 2023-02-02 NOTE — Care Management Important Message (Signed)
Important Message  Patient Details  Name: Omar Kennedy MRN: [id number] Date of Birth: Sep 03, 1945   Medicare Important Message Given:  Yes     Dorena Bodo 02/02/2023, 2:08 PM

## 2023-02-03 ENCOUNTER — Inpatient Hospital Stay (HOSPITAL_COMMUNITY): Payer: Medicare PPO

## 2023-02-03 DIAGNOSIS — F03918 Unspecified dementia, unspecified severity, with other behavioral disturbance: Secondary | ICD-10-CM | POA: Diagnosis not present

## 2023-02-03 DIAGNOSIS — I2510 Atherosclerotic heart disease of native coronary artery without angina pectoris: Secondary | ICD-10-CM | POA: Diagnosis not present

## 2023-02-03 DIAGNOSIS — R451 Restlessness and agitation: Secondary | ICD-10-CM | POA: Diagnosis not present

## 2023-02-03 DIAGNOSIS — G9341 Metabolic encephalopathy: Secondary | ICD-10-CM | POA: Diagnosis not present

## 2023-02-03 DIAGNOSIS — I10 Essential (primary) hypertension: Secondary | ICD-10-CM | POA: Diagnosis not present

## 2023-02-03 NOTE — Progress Notes (Signed)
Physical Therapy Treatment Patient Details Name: Omar Kennedy MRN: [id number] DOB: 1946/01/20 Today's Date: 02/03/2023   History of Present Illness 77 yo male admitted 7/12 from [facility] with AMS and hematuria. PMhx:  01/04/23-7/5 admission after falling into empty pool; Sustained R scalp laceration, T11 fx, multiple TVP fxs, SAH/ SDH, R chest wall hematoma, R clavicle fx (NWB, sling), L scapula fx (WBAT), R rib fxs 5-6. dementia, CAD x/p CABG, HTN, HLD, OSAP    PT Comments  Pt greeted resting in bed and agreeable to session. Pt continues to need max multimodal cues to maintain NWB on RUE with sling donned for session.  Pt able to progress gait distance with HHA and min A to steady throughout with pt able to verbalize when tired and needing seated rest. Pt needing cues throughout transfer and gait for navigation and safety. Pt continues to benefit from simple single step cues and pt pleasant and participatory throughout. Current plan remains appropriate to address deficits and maximize functional independence and decrease caregiver burden. Pt continues to benefit from skilled PT services to progress toward functional mobility goals.      Assistance Recommended at Discharge Frequent or constant Supervision/Assistance  If plan is discharge home, recommend the following:  Can travel by private vehicle    A little help with walking and/or transfers;A lot of help with bathing/dressing/bathroom;Assistance with cooking/housework;Direct supervision/assist for medications management;Assist for transportation;Assistance with feeding;Direct supervision/assist for financial management;Help with stairs or ramp for entrance   Yes  Equipment Recommendations  None recommended by PT    Recommendations for Other Services       Precautions / Restrictions Precautions Precautions: Back;Fall Precaution Comments: pt has sling in room and had spinal brace last admission without spinal brace present  currently and pt unable to recall, retain or follow cues for precautions, Restrictions Weight Bearing Restrictions: Yes RUE Weight Bearing: Non weight bearing     Mobility  Bed Mobility Overal bed mobility: Needs Assistance Bed Mobility: Sit to Supine Rolling: Min assist         General bed mobility comments: assist for RLE progression    Transfers Overall transfer level: Needs assistance Equipment used: 1 person hand held assist Transfers: Sit to/from Stand, Bed to chair/wheelchair/BSC Sit to Stand: Min assist   Step pivot transfers: Min assist       General transfer comment: able to come to stand from EOB x2 and BSC with min A to steady on rise    Ambulation/Gait Ambulation/Gait assistance: Min assist Gait Distance (Feet): 110 Feet (+33') Assistive device: 1 person hand held assist Gait Pattern/deviations: Step-through pattern, Decreased stride length, Narrow base of support Gait velocity: decr     General Gait Details: multimodal cues for direction and progression, pt able to report fatigue and need to sit to rest, intermittently shuffling feet   Stairs             Wheelchair Mobility     Tilt Bed    Modified Rankin (Stroke Patients Only)       Balance Overall balance assessment: Needs assistance Sitting-balance support: Bilateral upper extremity supported, Feet supported Sitting balance-Leahy Scale: Poor Sitting balance - Comments: sitting EOB   Standing balance support: Single extremity supported, During functional activity Standing balance-Leahy Scale: Poor Standing balance comment: UE support                            Cognition Arousal/Alertness: Awake/alert Behavior During Therapy: Flat  affect Overall Cognitive Status: History of cognitive impairments - at baseline                                 General Comments: Pt with history of dementia, follows simple 1 step commands inconsistently with mod-max  multimodal cues        Exercises      General Comments General comments (skin integrity, edema, etc.): VSS on RA      Pertinent Vitals/Pain Pain Assessment Pain Assessment: Faces Faces Pain Scale: Hurts little more Pain Location: generalized Pain Descriptors / Indicators: Grimacing, Guarding Pain Intervention(s): Monitored during session, Limited activity within patient's tolerance, Repositioned    Home Living                          Prior Function            PT Goals (current goals can now be found in the care plan section) Acute Rehab PT Goals PT Goal Formulation: Patient unable to participate in goal setting Time For Goal Achievement: 02/14/23 Progress towards PT goals: Progressing toward goals    Frequency    Min 1X/week      PT Plan Current plan remains appropriate    Co-evaluation              AM-PAC PT "6 Clicks" Mobility   Outcome Measure  Help needed turning from your back to your side while in a flat bed without using bedrails?: A Little Help needed moving from lying on your back to sitting on the side of a flat bed without using bedrails?: A Little Help needed moving to and from a bed to a chair (including a wheelchair)?: A Little Help needed standing up from a chair using your arms (e.g., wheelchair or bedside chair)?: A Little Help needed to walk in hospital room?: A Little Help needed climbing 3-5 steps with a railing? : Total 6 Click Score: 16    End of Session Equipment Utilized During Treatment: Gait belt Activity Tolerance: Patient tolerated treatment well Patient left: in chair;with call bell/phone within reach;with chair alarm set;with nursing/sitter in room Nurse Communication: Mobility status PT Visit Diagnosis: History of falling (Z91.81);Difficulty in walking, not elsewhere classified (R26.2);Other abnormalities of gait and mobility (R26.89)     Time: 1610-9604 PT Time Calculation (min) (ACUTE ONLY): 19  min  Charges:    $Therapeutic Activity: 8-22 mins PT General Charges $$ ACUTE PT VISIT: 1 Visit                     Lillyonna Armstead R. PTA Acute Rehabilitation Services Office: [phone]   Catalina Antigua 02/03/2023, 1:43 PM

## 2023-02-03 NOTE — Progress Notes (Signed)
SLP Cancellation Note  Patient Details Name: Omar Kennedy MRN: [id number] DOB: 1946/05/21   Cancelled treatment:       Reason Eval/Treat Not Completed: GOC with palliative recommending hospice upon discharge to skilled care. Pt with known history of dementia with severe behavioral disturbance. No benefit from skilled ST at this venue of care, but pt and family may benefit from ST services at next venue of care. SLP to s/o at this time.    Gwynneth Aliment, M.A., CF-SLP Speech Language Pathology, Acute Rehabilitation Services  Secure Chat preferred [phone]  02/03/2023, 8:31 AM

## 2023-02-03 NOTE — Consult Note (Signed)
Omar Kennedy is a 77 y.o. M with dementia, lived at home until 1 month ago, fell into his pool, was in there overnight, admitted to Trauma service after (found to have hematomas, multiple thoracic and lumbar spine transverse process fractures, small pneumothorax and hemothorax not requiring chest tube, several right rib fractures, and right clavicle fracture), also CAD s/p CABG, HTN, and OSA on CPAP who presented with several days of increased weakness, agitation, and gross hematuria.  Prior to psychiatric consult primary team has started quetiapine 25 mg p.o. 3 times daily for management of agitation.  Today staff reports patient is doing better. No agitation reported so far. Patient is oriented to person only. He is unable to participate in a full psychiatric exam. due to Dementia. The sitter reports no behavior problems so far  EKG obtained on January 29, 2023, QTc 451.  -Continue delirium precautions. -Continue current dose of quetiapine 25 mg p.o. 3 times daily.  Will adjust time of administration to target sundowners. Refrain from multiple antipsychotics and benzodiazepines as they can contribute to worsening neuropsychiatric symptoms.  Will defer use of Depakote due to slightly elevated ammonia level, also pt is reportedly doing better today.  D/w Joen Laura, MD   Psychiatric consult service will continue to follow.  Due to dementia diagnosis as a main exclusion criteria, patient does not meet criteria for inpatient psychiatric hospitalization. He would benefit from Memory care unit for better management of dementia and disease progression.

## 2023-02-03 NOTE — Progress Notes (Signed)
Patient ID: Omar Kennedy, male   DOB: 07-Oct-1945, 77 y.o.   MRN: [id number]    Progress Note from the Palliative Medicine Team at [facility]   Patient Name: Omar Kennedy        Date: 02/03/2023 DOB: 1946-07-13  Age: 77 y.o. MRN#: [id number] Attending Physician: Alberteen Sam, * Primary Care Physician: [facility] Admit Date: 01/29/2023    Extensive chart review has been completed prior to meeting with patient/family  including labs, vital signs, imaging, progress/consult notes, orders, medications and available advance directive documents.   77 y.o. male   admitted on 01/29/2023 with  with medical history significant of dementia, CAD s/p CABG, HTN, HLD, and OSA on CPAP presenting with hematuria and AMS.     He was last hospitalized from 6/17-7/5 after having been found down in an empty swimming pool, an estimated 10 foot fall.  He suffered a T11 fracture, multiple thoracic and lumbar spine transverse process fractures, SAH/SHD, R scalp laceration, R chest wall hematoma, R clavicle fracture, L scapula fracture, and multiple rib fractures with small PTX; he was discharged to SNF rehab.   Family report increased confusion through the last week at SNF/[facility].  Family reports severe agitation   Admitted for treatment and stabilization.    Family face treatment option decisions, advanced directive decisions and anticipatory care needs.     This NP assessed patient at the bedside as a follow up  for palliative medicine needs and emotional support, sister at bedside, patient is currently resting quietly.  Spoke to wife by telephone,  continue conversation regarding goals of care and next steps in treatment plan.    Family is still working on the transition plan to a facility in [place].  They tell me logistics are ready to go for Monday as long as patient is medically stable and dementia related behaviors are controlled   Attending/ Dr Maryfrances Bunnell in  collaboration with Psychiatry for symptom management specific to agitation.    Plan of care -DNR/DNI -no artificial feeding now or in the future -Symptom management:     -Agitation-see psych/attending recommendations     -  Continue Tylenol 650 mg po every 8 hours scheduled for underlying pain  -MOST form completed, hard copy in chart--currently limited interventions, however once patient arrives in [place] family plans to shift to a full comfort path and activate his hospice benefit -Family is working on future placement at a facility in [place] -PMT will continue to support holistically   Education offered today regarding  the importance of continued conversation with family and their  medical providers regarding overall plan of care and treatment options,  ensuring decisions are within the context of the patients values and GOCs.  Questions and concerns addressed   Discussed with Dr Maryfrances Bunnell and [facility] team via secure chat  This nurse practitioner informed  the family and the attending that I will be out of the hospital until Monday morning.  If the patient is still hospitalized I will follow-up at that time.  Call palliative medicine team phone # [phone] with questions or concerns in the interim.   Time: 35 minutes  Detailed review of medical records ( labs, imaging, vital signs), medically appropriate exam ( MS, skin, resp)   discussed with treatment team, counseling and education to patient, family, staff, documenting clinical information, medication management, coordination of care    Lorinda Creed NP  Palliative Medicine Team Team Phone # [phone] Pager [phone]

## 2023-02-03 NOTE — TOC Progression Note (Addendum)
Transition of Care [facility]) - Progression Note    Patient Details  Name: JHAYCE SICOTTE MRN: [id number] Date of Birth: 16-Jul-1946  Transition of Care [facility]) CM/SW Contact  Brittini Brubeck A [place], [place] Phone Number: 02/03/2023, 3:58 PM  Clinical Narrative:     Update 1137 CSW informed provider of pt's family's plan for ambulance transportation. Provider notified CSW to inform family to go ahead and schedule ambulance transportation on Sunday at 5pm.   CSW made attempt to contact Kayla but there was no answer. CSW was unable to leave voicemail.   CSW will follow back up at another time.  TOC will continue to follow.   CSW was contacted by Dorathy Daft [phone], liaison assisting family with coordinating care to [facility].   She informed CSW that the information CSW had provided for an alternative transportation vendor, [employer], was significantly more expensive and unaffordable. Pt's family is planning to stay with the ambulance transport they found.   She said that the family is attempting to coordinate ambulance transport and would need to schedule pt's ambulance transportation by end of day Thursday for earliest possible pick up from hospital of Sunday at 5pm.   CSW informed her that pt's medical stability would mandate discharge date but CSW will update pt's treatment team regarding information and possibility of transportation plan.    TOC will continue to follow.   Expected Discharge Plan: Skilled Nursing Facility Barriers to Discharge: Continued Medical Work up, English as a second language teacher  Expected Discharge Plan and Services       Living arrangements for the past 2 months: Skilled Nursing Facility, Single Family Home                                       Social Determinants of Health (SDOH) Interventions SDOH Screenings   Food Insecurity: Patient Unable To Answer (01/31/2023)  Housing: High Risk (01/31/2023)  Transportation Needs: Patient Unable To Answer  (01/31/2023)  Utilities: Patient Unable To Answer (01/31/2023)  Tobacco Use: Medium Risk (01/31/2023)    Readmission Risk Interventions     No data to display

## 2023-02-03 NOTE — Progress Notes (Signed)
Progress Note   Patient: Omar Kennedy MWU:[id number] DOB: April 18, 1946 DOA: 01/29/2023     4 DOS: the patient was seen and examined on 02/03/2023 at 9:57AM      Brief hospital course: Omar Kennedy is a 77 y.o. M with dementia, lived at home until 1 month ago, fell into his pool, admitted to Trauma (see below) also CAD s/p CABG, HTN, and OSA on CPAP who presented with agitated behaviors from SNF.      Assessment and Plan: * Acute metabolic encephalopathy Dementia with behavioral disturbance [facility]) Patient with advanced dementia (SLUMS 18/30 in 2021, MMSE 13/30 in 01/2022) prior to fall, significantly worse since fall/multitrauma in June.  On evaluation here, CT head shows resolution of findings from June, and Neurosurgery feel he has reverted to his previous findings of atrophy, and that nothing is clinically apparent from his imaging.  No WBC or fever.  No localizing signs, although his expressive and physical repertroire is limited.  CXR clear, CT abdomen and pelvis without new focal finding.    There was report of hematuria, but this is ruled out.  Overall, it appears that his SNF was unable to handle his impulsive behaviors and sent him back to the hospital.  Seroquel was titrated to TID (he was stable on this prior to admission).  Psychiatry were consulted at family request, and are following.  Thankfully, overnight last night, required no Haldol or Geodon. - Continue Seroquel TID - Cotninue Exelon, Lexapro - Consult Psychiatry - PT/OT - Palliative Care    Right clavicle fracture He is supposd to have a sling and NWB to right arm, but family report (and we have observed here) he is really unable to comply with this limitation.  X-ray today shows considerable displacement of his right clavicle, which is readily apparent on exam.  At this point, there is no reasonable expectation that he could wear the sling or comply with weight bearing restrictions.  Further, I do not believe  he is a candidate for surgical fixation (for the same reasons, would not be able to comply with post-op instructions)   -Consult palliative care    Fall from height of greater than 3 feet Fell into pool 1 month ago.  Found next morning and admitted as level 1 trauma.    Found ultimately to have hemothorax, small pneumothorax not requiring chest tube, multiple right rib fractures, right clavicle fracture, multiple thoracic and lumbar spine transverse process fractures, and subdural hematomas  Since the fall, as above his mentation has been considerably worse - Consult palliative care  Mixed hyperlipidemia - Continue Crestor  OSA (obstructive sleep apnea) - Defer CPAP given agitation  Essential hypertension BP slightly high - Continue amlodipine, Coreg, lisinopril - PRN hydralazine  Coronary artery disease involving native coronary artery of native heart without angina pectoris - Continue Crestor, Coreg - Hold aspirin           Subjective: No agitation overnight, relatively calm and cooperative during the day today.  Still impulsive.  No obvious pain complaints.  No fever.  No respiratory symptoms.     Physical Exam: BP 138/79 (BP Location: Left Arm)   Pulse 86   Temp 98.1 F (36.7 C)   Resp 16   Ht 5\' 10"  (1.778 m)   Wt 62.4 kg   SpO2 98%   BMI 19.74 kg/m   Elderly frail adult male, lying in bed, no acute distress RRR, no murmurs, no peripheral edema Respiratory rate seems normal, lungs clear  without rales or wheezes Obvious deformity of the right arm, large lump in the right chest which does not appear red or tender to palpation Mumbles incoherently, has a constant aching dyspnea, restlessly manipulates and follows with items around him, unable to make any meaningful verbalizations, makes eye contact, mostly is inattentive  Data Reviewed: Ammonia level slightly elevated Total bilirubin 2, likely indirect Renal function electrolytes normal CBC  normal   Family Communication: Wife by phone, called to daughter, left voicemail    Disposition: Status is: Inpatient The patient was admitted for agitation.  His Seroquel is been titrated, and appears to be improving.  If over the next few days, he is able to remain safe with oral Seroquel only, or other oral agents only, he could transition to a skilled nursing setting        Author: Alberteen Sam, MD 02/03/2023 4:56 PM  For on call review [email or web address].

## 2023-02-03 NOTE — TOC Progression Note (Signed)
Transition of Care [facility]) - Progression Note    Patient Details  Name: Omar Kennedy MRN: [id number] Date of Birth: 11-21-1945  Transition of [facility]) CM/SW Contact  Khylan Sawyer A [place], [place] Phone Number: 02/03/2023, 1:09 PM  Clinical Narrative:     CSW sent HIPAA compliant email to [facility] regarding requested documentation for pt.   TOC will continue to follow.     Expected Discharge Plan: Skilled Nursing Facility Barriers to Discharge: Continued Medical Work up, English as a second language teacher  Expected Discharge Plan and Services       Living arrangements for the past 2 months: Skilled Nursing Facility, Single Family Home                                       Social Determinants of Health (SDOH) Interventions SDOH Screenings   Food Insecurity: Patient Unable To Answer (01/31/2023)  Housing: High Risk (01/31/2023)  Transportation Needs: Patient Unable To Answer (01/31/2023)  Utilities: Patient Unable To Answer (01/31/2023)  Tobacco Use: Medium Risk (01/31/2023)    Readmission Risk Interventions     No data to display

## 2023-02-03 NOTE — NC FL2 (Signed)
Protection MEDICAID FL2 LEVEL OF CARE FORM     IDENTIFICATION  Patient Name: Omar Kennedy Birthdate: 05-10-1946 Sex: male Admission Date (Current Location): 01/29/2023  [employer] and [place] Number:  Producer, television/film/video and Address:  [facility], [address], [place], [place] [address]      Provider Number: [id number]  Attending Physician Name and Address:  Alberteen Sam, *  Relative Name and Phone Number:  Teejay Meader, wife - [phone]    Current Level of Care: Hospital Recommended Level of Care: Memory Care Prior Approval Number:    Date Approved/Denied:   PASRR Number: [id number]  Discharge Plan: Other (Comment) (Memory Care ALF)    Current Diagnoses: Patient Active Problem List   Diagnosis Date Noted   Right clavicle fracture 01/31/2023   AMS (altered mental status) 01/30/2023   Dementia with behavioral disturbance (HCC) 01/30/2023   Protein-calorie malnutrition, severe 01/18/2023   Fall from height of greater than 3 feet 01/04/2023   Mixed hyperlipidemia 12/29/2019   Hilar adenopathy 12/29/2019   Dehydration 12/29/2019   Unexplained weight loss 12/29/2019   Aspiration pneumonia of right lower lobe due to gastric secretions (HCC) 12/29/2019   Acute metabolic encephalopathy 12/29/2019   CAP (community acquired pneumonia) 12/29/2019   Sleep apnea 09/26/2019   OSA (obstructive sleep apnea) 08/03/2019   Nocturia more than twice per night 08/03/2019   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   Vivid dream 08/03/2019   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   Essential hypertension 08/12/2017   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   Coronary artery disease involving native coronary artery of native heart without angina pectoris 05/27/2017   Elevated blood-pressure reading without diagnosis of hypertension 12/10/2015   Memory loss, short term 12/10/2015   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/2015   Nasal polyps 12/10/2015    Poorly-controlled hypertension 12/10/2015   Enlarged prostate 05/20/2011   Insomnia 05/20/2011    Orientation RESPIRATION BLADDER Height & Weight     Self, Place  Normal Incontinent Weight: 62.4 kg Height:  5\' 10"  (177.8 cm)  BEHAVIORAL SYMPTOMS/MOOD NEUROLOGICAL BOWEL NUTRITION STATUS      Incontinent Diet (Regular diet)  AMBULATORY STATUS COMMUNICATION OF NEEDS Skin   Limited Assist Verbally Normal                       Personal Care Assistance Level of Assistance  Bathing, Feeding, Dressing Bathing Assistance: Limited assistance Feeding assistance: Limited assistance Dressing Assistance: Limited assistance     Functional Limitations Info  Sight, Hearing, Speech Sight Info: Impaired Hearing Info: Impaired Speech Info: Adequate    SPECIAL CARE FACTORS FREQUENCY  PT (By licensed PT), OT (By licensed OT)     PT Frequency: 2 x per week OT Frequency: 2 x per week            Contractures Contractures Info: Not present    Additional Factors Info  Code Status, Allergies, Psychotropic Code Status Info: DNR Allergies Info: Tetracycline, amoxicillin, Cephalexin, Wasp venom Psychotropic Info: Lexapro, Seroquel, Elexon         Current Medications (02/03/2023):  This is the current hospital active medication list Current Facility-Administered Medications  Medication Dose Route Frequency Provider Last Rate Last Admin   acetaminophen (TYLENOL) tablet 650 mg  650 mg Oral TID Canary Brim, NP   650 mg at 02/03/23 1005   amLODipine (NORVASC) tablet 5 mg  5 mg Oral Daily Jonah Blue, MD   5  mg at 02/03/23 1005   bisacodyl (DULCOLAX) EC tablet 5 mg  5 mg Oral Daily PRN Jonah Blue, MD       carvedilol (COREG) tablet 6.25 mg  6.25 mg Oral Daily Jonah Blue, MD   6.25 mg at 02/03/23 1005   docusate sodium (COLACE) capsule 100 mg  100 mg Oral BID Jonah Blue, MD   100 mg at 02/03/23 1005   escitalopram (LEXAPRO) tablet 5 mg  5 mg Oral Daily Jonah Blue,  MD   5 mg at 02/03/23 1005   feeding supplement (ENSURE ENLIVE / ENSURE PLUS) liquid 237 mL  237 mL Oral BID BM Danford, Earl Lites, MD   237 mL at 02/03/23 1013   fluticasone (FLONASE) 50 MCG/ACT nasal spray 1 spray  1 spray Each Nare QPM Jonah Blue, MD   1 spray at 02/02/23 1801   hydrALAZINE (APRESOLINE) injection 5 mg  5 mg Intravenous Q4H PRN Jonah Blue, MD       lactated ringers infusion   Intravenous Continuous Jonah Blue, MD       lidocaine (LIDODERM) 5 % 2 patch  2 patch Transdermal Q24H Jonah Blue, MD   2 patch at 02/02/23 1648   lisinopril (ZESTRIL) tablet 20 mg  20 mg Oral Daily Danford, Earl Lites, MD   20 mg at 02/03/23 1005   loratadine (CLARITIN) tablet 10 mg  10 mg Oral Daily Jonah Blue, MD   10 mg at 02/03/23 1013   megestrol (MEGACE) 400 MG/10ML suspension 400 mg  400 mg Oral Daily Jonah Blue, MD   400 mg at 02/03/23 1012   ondansetron (ZOFRAN) tablet 4 mg  4 mg Oral Q6H PRN Jonah Blue, MD       Or   ondansetron [facility]) injection 4 mg  4 mg Intravenous Q6H PRN Jonah Blue, MD       oxyCODONE (Oxy IR/ROXICODONE) immediate release tablet 5 mg  5 mg Oral Q4H PRN Jonah Blue, MD       pantoprazole (PROTONIX) EC tablet 40 mg  40 mg Oral Noemi Chapel, MD   40 mg at 02/02/23 2151   polyethylene glycol (MIRALAX / GLYCOLAX) packet 17 g  17 g Oral Daily PRN Jonah Blue, MD       QUEtiapine (SEROQUEL) tablet 25 mg  25 mg Oral TID Maryagnes Amos, FNP   25 mg at 02/03/23 1005   rivastigmine (EXELON) capsule 3 mg  3 mg Oral BID Jonah Blue, MD   3 mg at 02/03/23 1005   rosuvastatin (CRESTOR) tablet 40 mg  40 mg Oral Noemi Chapel, MD   40 mg at 02/02/23 2150   senna (SENOKOT) tablet 17.2 mg  2 tablet Oral BID Jonah Blue, MD   17.2 mg at 02/03/23 1005   sodium chloride flush (NS) 0.9 % injection 3 mL  3 mL Intravenous Steva Colder, MD   3 mL at 02/01/23 2725     Discharge Medications: Please see  discharge summary for a list of discharge medications.  Relevant Imaging Results:  Relevant Lab Results:   Additional Information SSN: [id number]  Janae Bridgeman, RN

## 2023-02-03 NOTE — Plan of Care (Signed)

## 2023-02-04 DIAGNOSIS — F03918 Unspecified dementia, unspecified severity, with other behavioral disturbance: Secondary | ICD-10-CM | POA: Diagnosis not present

## 2023-02-04 DIAGNOSIS — G9341 Metabolic encephalopathy: Secondary | ICD-10-CM | POA: Diagnosis not present

## 2023-02-04 DIAGNOSIS — G4733 Obstructive sleep apnea (adult) (pediatric): Secondary | ICD-10-CM | POA: Diagnosis not present

## 2023-02-04 DIAGNOSIS — I10 Essential (primary) hypertension: Secondary | ICD-10-CM | POA: Diagnosis not present

## 2023-02-04 MED ORDER — ENOXAPARIN SODIUM 40 MG/0.4ML IJ SOSY
40.0000 mg | PREFILLED_SYRINGE | INTRAMUSCULAR | Status: DC
  Administered 2023-02-04 – 2023-02-06 (×3): 40 mg via SUBCUTANEOUS
  Filled 2023-02-04 (×3): qty 0.4

## 2023-02-04 NOTE — Progress Notes (Signed)
[facility]                                                                               Omar Kennedy, is a 77 y.o. male, DOB - October 19, 1945, HYQ:[id number] Admit date - 01/29/2023    Outpatient Primary MD for the patient is [facility]  LOS - 5  days    Brief summary   Omar Kennedy is a 77 y.o. M with dementia, lived at home until 1 month ago, fell into his pool, admitted to Trauma (see below) also CAD s/p CABG, HTN, and OSA on CPAP who presented with agitated behaviors from SNF.    Assessment & Plan    Assessment and Plan: * Acute metabolic encephalopathy Patient with advanced dementia (SLUMS 18/30 in 2021, MMSE 13/30 in 01/2022) prior to fall, significantly worse since fall/multitrauma in June.  On evaluation here, CT head shows resolution of findings from June, and Neurosurgery feel he has reverted to his previous findings of atrophy, and that nothing is clinically apparent from his imaging.  No WBC or fever.  No localizing signs, although his expressive and physical repertroire is limited.  CXR clear, CT abdomen and pelvis without new focal finding.   Psychiatry consulted and continue with Seroquel, exelon and lexapro.    Right clavicle fracture Pt denied any pain.  Pt refusing to put any Sling.   Dementia with behavioral disturbance (HCC) - Continue Exelon - Continue Seroquel (stable on this prior to fall) - Continue Lexapro - Continue Megace No agitation today.   Fall from height of greater than 3 feet Fell into pool 1 month ago.  Found next morning and admitted as level 1 trauma.   Found ultimately to have hemothorax, small pneumothorax not requiring chest tube, multiple right rib fractures, right clavicle fracture, multiple thoracic and lumbar spine transverse process fractures, and subdural hematomas   Palliative care consulted .   Mixed hyperlipidemia - Continue Crestor  OSA (obstructive sleep apnea) - Defer CPAP given  agitation  Essential hypertension BP improving.  - Continue amlodipine, Coreg - Resume lisinopril - PRN hydralazine  Coronary artery disease involving native coronary artery of native heart without angina pectoris - Continue Crestor - Cotninue Coreg - Hold aspirin          RN Pressure Injury Documentation:    Malnutrition Type:  Nutrition Problem: Inadequate oral intake Etiology: lethargy/confusion   Malnutrition Characteristics:  Signs/Symptoms: meal completion < 25%   Nutrition Interventions:  Interventions: Ensure Enlive (each supplement provides 350kcal and 20 grams of protein)  Estimated body mass index is 20.47 kg/m as calculated from the following:   Height as of this encounter: 5\' 10"  (1.778 m).   Weight as of this encounter: 64.7 kg.  Code Status: DNR DVT Prophylaxis:  SCDs Start: 01/30/23 1236   Level of Care: Level of care: Med-Surg Family Communication: none at bedside.   Disposition Plan:     Remains inpatient appropriate:  awaiting for SNF placement.     Consultants:   None.   Antimicrobials:   Anti-infectives (From admission, onward)    Start     Dose/Rate Route Frequency Ordered Stop   01/30/23 0145  cefTRIAXone (ROCEPHIN) 2 g in sodium chloride 0.9 % 100 mL IVPB  Status:  Discontinued        2 g 200 mL/hr over 30 Minutes Intravenous  Once 01/30/23 0133 01/30/23 1200        Medications  Scheduled Meds:  acetaminophen  650 mg Oral TID   amLODipine  5 mg Oral Daily   carvedilol  6.25 mg Oral Daily   docusate sodium  100 mg Oral BID   escitalopram  5 mg Oral Daily   feeding supplement  237 mL Oral BID BM   fluticasone  1 spray Each Nare QPM   lidocaine  2 patch Transdermal Q24H   lisinopril  20 mg Oral Daily   loratadine  10 mg Oral Daily   megestrol  400 mg Oral Daily   pantoprazole  40 mg Oral QHS   QUEtiapine  25 mg Oral TID   rivastigmine  3 mg Oral BID   rosuvastatin  40 mg Oral QHS   senna  2 tablet Oral BID    sodium chloride flush  3 mL Intravenous Q12H   Continuous Infusions:  lactated ringers     PRN Meds:.bisacodyl, hydrALAZINE, ondansetron **OR** ondansetron (ZOFRAN) IV, oxyCODONE, polyethylene glycol    Subjective:   Omar Kennedy was seen and examined today.  Pleasantly confused, no new complaints.   Objective:   Vitals:   02/04/23 0500 02/04/23 0543 02/04/23 0741 02/04/23 0944  BP:  (!) 157/84 117/72 (!) 147/77  Pulse:  (!) 105 (!) 111 (!) 105  Resp:  18 19   Temp:  97.6 F (36.4 C) 98.7 F (37.1 C)   TempSrc:  Oral    SpO2:  99% 99%   Weight: 64.7 kg     Height:        Intake/Output Summary (Last 24 hours) at 02/04/2023 1400 Last data filed at 02/03/2023 2200 Gross per 24 hour  Intake 30 ml  Output --  Net 30 ml   Filed Weights   02/02/23 0620 02/03/23 0304 02/04/23 0500  Weight: 62 kg 62.4 kg 64.7 kg     Exam General exam: Appears calm and comfortable  Respiratory system: Clear to auscultation. Respiratory effort normal. Cardiovascular system: S1 & S2 heard, RRR.  Gastrointestinal system: Abdomen is nondistended, soft and nontender.  Central nervous system: Alert, no focal deficits. Able to move all extremities.  Extremities: no pedal edema.  Skin: No rashes, Psychiatry:pleasantly confused.     Data Reviewed:  I have personally reviewed following labs and imaging studies   CBC Lab Results  Component Value Date   WBC 6.3 01/31/2023   RBC 4.27 01/31/2023   HGB 13.0 01/31/2023   HCT 39.7 01/31/2023   MCV 93.0 01/31/2023   MCH 30.4 01/31/2023   PLT 278 01/31/2023   MCHC 32.7 01/31/2023   RDW 15.9 (H) 01/31/2023   LYMPHSABS 2.3 01/29/2023   MONOABS 0.6 01/29/2023   EOSABS 0.1 01/29/2023   BASOSABS 0.0 01/29/2023     Last metabolic panel Lab Results  Component Value Date   NA 138 02/02/2023   K 4.0 02/02/2023   CL 110 02/02/2023   CO2 21 (L) 02/02/2023   BUN 12 02/02/2023   CREATININE 1.06 02/02/2023   GLUCOSE 108 (H) 02/02/2023    GFRNONAA >60 02/02/2023   GFRAA >60 12/29/2019   CALCIUM 9.1 02/02/2023   PHOS 3.2 01/14/2023   PROT 5.6 (L) 02/02/2023   ALBUMIN 3.3 (L) 02/02/2023   BILITOT 2.0 (H) 02/02/2023  ALKPHOS 140 (H) 02/02/2023   AST 40 02/02/2023   ALT 42 02/02/2023   ANIONGAP 7 02/02/2023    CBG (last 3)  No results for input(s): "GLUCAP" in the last 72 hours.    Coagulation Profile: No results for input(s): "INR", "PROTIME" in the last 168 hours.   Radiology Studies: DG Clavicle Right  Result Date: 02/03/2023 CLINICAL DATA:  Fracture the right medial clavicle and right-sided ribs EXAM: RIGHT CLAVICLE - 2+ VIEWS COMPARISON:  Chest CT 01/08/2023 and chest radiograph from 01/29/2023 FINDINGS: The medial right clavicular fracture is only faintly seen on today's exam, not unexpected as this portion of the clavicle is difficult to assess radiographically, and is better assessed by CT. The long axis of the shaft of the clavicle is below the expected location of the sternoclavicular joint implying that the dominant mid and distal clavicular fragment is inferiorly displaced with respect to the clavicular head/medial clavicular fragment. This matches the appearance on image 14 of series 6 of the CT exam of 01/08/2023. No fracture of the mid or distal clavicle is observed. AC joint alignment on the right is normal. Displaced right posterior fifth, sixth, seventh, and eighth rib fractures noted. Prior CABG.  Atherosclerotic calcification of the aortic arch. IMPRESSION: 1. Displaced medial clavicular fracture, with indistinct medial clavicular head fragment. Medial clavicular fractures are typically more visible and better characterized by CT. 2. Displaced right posterior fifth, sixth, seventh, and eighth rib fractures. Electronically Signed   By: Gaylyn Rong M.D.   On: 02/03/2023 15:28       Kathlen Mody M.D. [facility] 02/04/2023, 2:00 PM  Available via Epic secure chat 7am-7pm After 7 pm, please  refer to night coverage provider listed on amion.

## 2023-02-04 NOTE — Progress Notes (Signed)
SCD placed per order. 

## 2023-02-04 NOTE — Progress Notes (Signed)
Patient's med was crushed and given to patient with about 30 mL of ensure. Patient took 3 sips and refused the rest.  Shay Jhaveri

## 2023-02-04 NOTE — Plan of Care (Signed)

## 2023-02-04 NOTE — Progress Notes (Signed)
Occupational Therapy Treatment Patient Details Name: Omar Kennedy MRN: [id number] DOB: 02/06/1946 Today's Date: 02/04/2023   History of present illness 77 yo male admitted 7/12 from [facility] with AMS and hematuria. PMhx:  01/04/23-7/5 admission after falling into empty pool; Sustained R scalp laceration, T11 fx, multiple TVP fxs, SAH/ SDH, R chest wall hematoma, R clavicle fx (NWB, sling), L scapula fx (WBAT), R rib fxs 5-6. dementia, CAD x/p CABG, HTN, HLD, OSAP   OT comments  Pt is making limited progress towards their acute OT goals. Upon arrival, pt was resting in bed propped up on his RUE. Assisted pt to the EOB with mod A to maintain NWB RUE and don a new gown and sling. Pt needed maximal cues for redirection and participation. Overall he completed 3x sit<>stands with mod A, once standing pt would not take steps but instead pushed backwards to sit. OT to continue to follow acutely to facilitate progress towards established goals. Pt will continue to benefit from skilled inpatient follow up therapy, <3 hours/day.     Recommendations for follow up therapy are one component of a multi-disciplinary discharge planning process, led by the attending physician.  Recommendations may be updated based on patient status, additional functional criteria and insurance authorization.    Assistance Recommended at Discharge Frequent or constant Supervision/Assistance  Patient can return home with the following  A little help with walking and/or transfers;A lot of help with bathing/dressing/bathroom;Assistance with cooking/housework;Direct supervision/assist for medications management;Direct supervision/assist for financial management;Assist for transportation;Help with stairs or ramp for entrance   Equipment Recommendations  Other (comment)       Precautions / Restrictions Precautions Precautions: Back;Fall Precaution Booklet Issued: No Precaution Comments: pt has sling in room and had spinal  brace last admission without spinal brace present currently and pt unable to recall, retain or follow cues for precautions, Required Braces or Orthoses: Sling;Spinal Brace Spinal Brace: Lumbar corset;Applied in sitting position Restrictions Weight Bearing Restrictions: Yes RUE Weight Bearing: Non weight bearing LUE Weight Bearing: Weight bearing as tolerated Other Position/Activity Restrictions: Pt WBing and using RUE despite sling, difficult to maintain precuations.       Mobility Bed Mobility Overal bed mobility: Needs Assistance Bed Mobility: Supine to Sit, Sit to Sidelying     Supine to sit: Mod assist   Sit to sidelying: Mod assist General bed mobility comments: more assist needed this date, pt not initiating to sequencing functional tasks    Transfers Overall transfer level: Needs assistance Equipment used: 1 person hand held assist Transfers: Sit to/from Stand Sit to Stand: Mod assist           General transfer comment: mod A for 3x STS with HHA. Pt unable to take steps this date despite maximal multimodal cues, pt pushing to sit after abotu 20 seconds standing     Balance   Sitting-balance support: Single extremity supported Sitting balance-Leahy Scale: Poor     Standing balance support: Single extremity supported Standing balance-Leahy Scale: Poor                             ADL either performed or assessed with clinical judgement   ADL                   Upper Body Dressing : Maximal assistance;Sitting       Toilet Transfer: Maximal assistance Toilet Transfer Details (indicate cue type and reason): simulated  Functional mobility during ADLs: Moderate assistance General ADL Comments: more assist needed for basic mobility this date. maximal cues adn redirection needed for all tasks. Pt did not follow any functional tasks    Extremity/Trunk Assessment Upper Extremity Assessment Upper Extremity Assessment: RUE  deficits/detail;LUE deficits/detail RUE Deficits / Details: NWB per last admit, no sling present. Required max cues to maintain NWB LUE Deficits / Details: [facility]   Lower Extremity Assessment Lower Extremity Assessment: Defer to PT evaluation        Vision   Vision Assessment?: No apparent visual deficits   Perception Perception Perception: Not tested   Praxis Praxis Praxis: Not tested    Cognition Arousal/Alertness: Awake/alert Behavior During Therapy: Flat affect Overall Cognitive Status: History of cognitive impairments - at baseline                                 General Comments: advanced dementia, unable to follow commands.              General Comments VSS on RA, sitter present    Pertinent Vitals/ Pain       Pain Assessment Pain Assessment: Faces Faces Pain Scale: Hurts a little bit Pain Location: generalized Pain Descriptors / Indicators: Grimacing, Guarding Pain Intervention(s): Limited activity within patient's tolerance, Monitored during session   Frequency  Min 2X/week        Progress Toward Goals  OT Goals(current goals can now be found in the care plan section)  Progress towards OT goals: Not progressing toward goals - comment (increased assist needed this date)  Acute Rehab OT Goals Patient Stated Goal: to lay down OT Goal Formulation: With patient Time For Goal Achievement: 02/14/23 Potential to Achieve Goals: Fair ADL Goals Pt Will Perform Grooming: with min guard assist;standing Pt Will Perform Upper Body Dressing: with min assist;sitting Pt Will Transfer to Toilet: with supervision;ambulating;regular height toilet Additional ADL Goal #1: Pt will complete 1 step ADL task with min cues Additional ADL Goal #2: Pt will complete log rolling to sitting position with Mod A  Plan Discharge plan remains appropriate;Frequency remains appropriate       AM-PAC OT "6 Clicks" Daily Activity     Outcome Measure   Help from  another person eating meals?: A Little Help from another person taking care of personal grooming?: A Little Help from another person toileting, which includes using toliet, bedpan, or urinal?: A Little Help from another person bathing (including washing, rinsing, drying)?: A Lot Help from another person to put on and taking off regular upper body clothing?: A Lot Help from another person to put on and taking off regular lower body clothing?: A Lot 6 Click Score: 15    End of Session Equipment Utilized During Treatment: Gait belt;Other (comment) (sling)  OT Visit Diagnosis: Unsteadiness on feet (R26.81);History of falling (Z91.81);Muscle weakness (generalized) (M62.81);Other symptoms and signs involving cognitive function   Activity Tolerance Patient tolerated treatment well   Patient Left in bed;with call bell/phone within reach;with bed alarm set;with family/visitor present   Nurse Communication Mobility status        Time: 1345-1401 OT Time Calculation (min): 16 min  Charges: OT General Charges $OT Visit: 1 Visit OT Treatments $Therapeutic Activity: 8-22 mins  Derenda Mis, OTR/L Acute Rehabilitation Services Office [phone] Secure Chat Communication Preferred   Donia Pounds 02/04/2023, 2:26 PM

## 2023-02-05 DIAGNOSIS — F03918 Unspecified dementia, unspecified severity, with other behavioral disturbance: Secondary | ICD-10-CM | POA: Diagnosis not present

## 2023-02-05 DIAGNOSIS — G9341 Metabolic encephalopathy: Secondary | ICD-10-CM | POA: Diagnosis not present

## 2023-02-05 DIAGNOSIS — I10 Essential (primary) hypertension: Secondary | ICD-10-CM | POA: Diagnosis not present

## 2023-02-05 DIAGNOSIS — S42001D Fracture of unspecified part of right clavicle, subsequent encounter for fracture with routine healing: Secondary | ICD-10-CM | POA: Diagnosis not present

## 2023-02-05 NOTE — Plan of Care (Signed)

## 2023-02-05 NOTE — Progress Notes (Signed)
[facility]                                                                               Omar Kennedy, is a 77 y.o. male, DOB - Sep 21, 1945, ZOX:[id number] Admit date - 01/29/2023    Outpatient Primary MD for the patient is [facility]  LOS - 6  days    Brief summary   Omar Kennedy is a 77 y.o. M with dementia, lived at home until 1 month ago, fell into his pool, admitted to Trauma (see below) also CAD s/p CABG, HTN, and OSA on CPAP who presented with agitated behaviors from SNF.    Assessment & Plan    Assessment and Plan: * Acute metabolic encephalopathy Patient with advanced dementia (SLUMS 18/30 in 2021, MMSE 13/30 in 01/2022) prior to fall, significantly worse since fall/multitrauma in June.  On evaluation here, CT head shows resolution of findings from June, and Neurosurgery feel he has reverted to his previous findings of atrophy, and that nothing is clinically apparent from his imaging.  No WBC or fever.  No localizing signs,.  CXR clear, CT abdomen and pelvis without new focal finding.   Psychiatry consulted and continue with Seroquel, exelon and lexapro.    Right clavicle fracture Patient denies any pain, refusing to wear sling. Not following any commands.   Dementia with behavioral disturbance (HCC) Resume Exelon, Seroquel, lexapro and Megace.   Fall from height of greater than 3 feet Fell into pool 1 month ago.  Found next morning and admitted as level 1 trauma.   Found ultimately to have hemothorax, small pneumothorax not requiring chest tube, multiple right rib fractures, right clavicle fracture, multiple thoracic and lumbar spine transverse process fractures, and subdural hematomas   Palliative care consulted and recommendations given.   Mixed hyperlipidemia Resume Crestor.   OSA (obstructive sleep apnea) - Defer CPAP given agitation  Essential hypertension Well controlled. Continue with lisinopril, norvasc and coreg.    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 Family has set up the transportation.    RN Pressure Injury Documentation:    Malnutrition Type:  Nutrition Problem: Inadequate oral intake Etiology: lethargy/confusion   Malnutrition Characteristics:  Signs/Symptoms: meal completion < 25%   Nutrition Interventions:  Interventions: Ensure Enlive (each supplement provides 350kcal and 20 grams of protein)  Estimated body mass index is 20.47 kg/m as calculated from the following:   Height as of this encounter: 5\' 10"  (1.778 m).   Weight as of this encounter: 64.7 kg.  Code Status: DNR DVT Prophylaxis:  enoxaparin (LOVENOX) injection 40 mg Start: 02/04/23 2200 SCDs Start: 01/30/23 1236   Level of Care: Level of care: Med-Surg Family Communication: none at bedside.   Disposition Plan:     Remains inpatient appropriate:  [facility] of [place], [place].     Consultants:   Psychiatry Palliative care Neurosurgery.    Antimicrobials:   Anti-infectives (From  admission, onward)    Start     Dose/Rate Route Frequency Ordered Stop   01/30/23 0145  cefTRIAXone (ROCEPHIN) 2 g in sodium chloride 0.9 % 100 mL IVPB  Status:  Discontinued        2 g 200 mL/hr over 30 Minutes Intravenous  Once 01/30/23 0133 01/30/23 1200        Medications  Scheduled Meds:  acetaminophen  650 mg Oral TID   amLODipine  5 mg Oral Daily   carvedilol  6.25 mg Oral Daily   docusate sodium  100 mg Oral BID   enoxaparin (LOVENOX) injection  40 mg Subcutaneous Q24H   escitalopram  5 mg Oral Daily   feeding supplement  237 mL Oral BID BM   fluticasone  1 spray Each Nare QPM   lidocaine  2 patch Transdermal  Q24H   lisinopril  20 mg Oral Daily   loratadine  10 mg Oral Daily   megestrol  400 mg Oral Daily   pantoprazole  40 mg Oral QHS   QUEtiapine  25 mg Oral TID   rivastigmine  3 mg Oral BID   rosuvastatin  40 mg Oral QHS   senna  2 tablet Oral BID   sodium chloride flush  3 mL Intravenous Q12H   Continuous Infusions:  lactated ringers     PRN Meds:.bisacodyl, hydrALAZINE, ondansetron **OR** ondansetron (ZOFRAN) IV, oxyCODONE, polyethylene glycol    Subjective:   Omar Kennedy was seen and examined today. Pleasantly confused.   Objective:   Vitals:   02/04/23 1627 02/04/23 1916 02/05/23 0546 02/05/23 0832  BP: (!) 145/126 (!) 122/90 128/68 (!) 129/53  Pulse: 88 (!) 104 92 86  Resp: 16   18  Temp: 98.6 F (37 C)  98.2 F (36.8 C) (!) 97.4 F (36.3 C)  TempSrc: Oral  Axillary Oral  SpO2: 100% 100% 98% 99%  Weight:      Height:        Intake/Output Summary (Last 24 hours) at 02/05/2023 1554 Last data filed at 02/05/2023 1358 Gross per 24 hour  Intake 320 ml  Output --  Net 320 ml   Filed Weights   02/02/23 0620 02/03/23 0304 02/04/23 0500  Weight: 62 kg 62.4 kg 64.7 kg     Exam General exam: Appears calm and comfortable  Respiratory system: diminished at bases. Cardiovascular system: S1 & S2 heard, RRR. No JVD,  Gastrointestinal system: Abdomen is nondistended, soft and nontender.. Central nervous system: alert, not following commands.  Extremities: no pedal edema Skin: No rashes,  Psychiatry:  unable to assess.     Data Reviewed:  I have personally reviewed following labs and imaging studies   CBC Lab Results  Component Value Date   WBC 6.3 01/31/2023   RBC 4.27 01/31/2023   HGB 13.0 01/31/2023   HCT 39.7 01/31/2023   MCV 93.0 01/31/2023   MCH 30.4 01/31/2023   PLT 278 01/31/2023   MCHC 32.7 01/31/2023   RDW 15.9 (H) 01/31/2023   LYMPHSABS 2.3 01/29/2023   MONOABS 0.6 01/29/2023   EOSABS 0.1 01/29/2023   BASOSABS 0.0 01/29/2023     Last  metabolic panel Lab Results  Component Value Date   NA 138 02/02/2023   K 4.0 02/02/2023   CL 110 02/02/2023   CO2 21 (L) 02/02/2023   BUN 12 02/02/2023   CREATININE 1.06 02/02/2023   GLUCOSE 108 (H) 02/02/2023   GFRNONAA >60 02/02/2023   GFRAA >60 12/29/2019   CALCIUM 9.1 02/02/2023  PHOS 3.2 01/14/2023   PROT 5.6 (L) 02/02/2023   ALBUMIN 3.3 (L) 02/02/2023   BILITOT 2.0 (H) 02/02/2023   ALKPHOS 140 (H) 02/02/2023   AST 40 02/02/2023   ALT 42 02/02/2023   ANIONGAP 7 02/02/2023    CBG (last 3)  No results for input(s): "GLUCAP" in the last 72 hours.    Coagulation Profile: No results for input(s): "INR", "PROTIME" in the last 168 hours.   Radiology Studies: No results found.     Kathlen Mody M.D. [facility] 02/05/2023, 3:54 PM  Available via Epic secure chat 7am-7pm After 7 pm, please refer to night coverage provider listed on amion.

## 2023-02-05 NOTE — TOC Progression Note (Addendum)
Transition of Care [facility]) - Progression Note    Patient Details  Name: Omar Kennedy MRN: [id number] Date of Birth: 11-25-1945  Transition of Care [facility]) CM/SW Contact  Lawerance Matsuo A [place], [place] Phone Number: 02/05/2023, 4:02 PM  Clinical Narrative:     CSW contacted Kayla, liaison for pt's discharge, to solidify weekend discharge plan.   Patient is to be transported to [facility] [place], [place].   Pt's family is to have palliative outpatient with company they have selected in [place], no need for referral to be made by CSW.    Pt is to be picked up on Sunday, 7/21 at approximately 5pm by [facility]. Pt's family has set up transportation and has contact number if needed.   Discharge summary and patient belongings to be sent with pt.   Point of contact if issues, reach out to [place], [phone].   She is to provide weekend social worker with contact information to [facility] facility over the weekend when she has it.   CSW provided contacted number for Dellie Burns, weekend social worker to [place].   TOC will continue to follow.     Expected Discharge Plan: Skilled Nursing Facility Barriers to Discharge: Continued Medical Work up, English as a second language teacher  Expected Discharge Plan and Services       Living arrangements for the past 2 months: Skilled Nursing Facility, Single Family Home                                       Social Determinants of Health (SDOH) Interventions SDOH Screenings   Food Insecurity: Patient Unable To Answer (01/31/2023)  Housing: High Risk (01/31/2023)  Transportation Needs: Patient Unable To Answer (01/31/2023)  Utilities: Patient Unable To Answer (01/31/2023)  Tobacco Use: Medium Risk (01/31/2023)    Readmission Risk Interventions     No data to display

## 2023-02-05 NOTE — Consult Note (Signed)
[facility] Face-to-Face Psychiatry Consult   Reason for Consult:  Admission to geropsych? Titration of medication for agitated dementia? Referring Physician:  Joen Laura Patient Identification: Omar Kennedy MRN:  [id number] Principal Diagnosis: Acute metabolic encephalopathy Diagnosis:  Principal Problem:   Acute metabolic encephalopathy Active Problems:   Coronary artery disease involving native coronary artery of native heart without angina pectoris   Essential hypertension   OSA (obstructive sleep apnea)   Mixed hyperlipidemia   Fall from height of greater than 3 feet   Dementia with behavioral disturbance (HCC)   Right clavicle fracture   Total Time spent with patient: 15 minutes  Subjective:   Omar Kennedy is a 77 y.o. male was admitted due to altered mental status.  Psychiatric consult was placed for titration of agitation medications related to dementia.  Admission to geropsych?  Patient was seen and evaluated face-to-face by this provider.  Patient observed resting in bed.  Per nursing staff patient has been up most of the night.  Difficult to arouse.  Chart reviewed patient is currently ordered Seroquel 25 mg p.o. 3 times daily and Lexapro 5 mg p.o. daily.  Chart reviewed no documented overt behaviors noted.  Per previous documentation will consider Depakote sprinkles for worsening behavioral disturbance.  Chart review does not indicate a need for Depakote at this time.  Patient remains psychiatrically cleared.  Continue current medication regimen.  Case staffed with attending psychiatrist MD Lucianne Muss.  Support, encouragement and  reassurance was provided.  -Plan: Patient to follow-up with memory care unit and/or skilled nursing facility for SNF placement.   HPI:  Per admission assessment note: "77 y.o. 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   Nasal turbinate hypertrophy    Obstructive sleep apnea 06/2018   Not on CPAP until after sinus surgery; positional therapy not helpful   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   Vertigo     Past Surgical History:  Procedure Laterality Date   CARDIAC SURGERY     CORONARY ARTERY BYPASS GRAFT  2018   ENDOSCOPIC CONCHA BULLOSA RESECTION Bilateral 08/02/2018   Procedure: ENDOSCOPIC BILATERAL CONCHA BULLOSA RESECTION;  Surgeon: Newman Pies, MD;  Location: [facility];  Service: ENT;  Laterality: Bilateral;   ETHMOIDECTOMY Bilateral 08/02/2018   Procedure: TOTAL ETHMOIDECTOMY AND SPHENOIDECTOMY;  Surgeon: Newman Pies, MD;  Location: [facility];  Service: ENT;  Laterality: Bilateral;   EXCISION MORTON'S NEUROMA Left    FRONTAL SINUS EXPLORATION Bilateral 08/02/2018   Procedure: ENDOSCOPIC BILATERAL FRONTAL RECESS EXPLORATION;  Surgeon: Newman Pies, MD;  Location: [facility];  Service: ENT;  Laterality: Bilateral;   HERNIA REPAIR     MAXILLARY ANTROSTOMY Bilateral 08/02/2018   Procedure: ENDOSCOPIC BILATERAL MAXILLARY ANTROSTOMY WITH TISSUE REMOVAL;  Surgeon: Newman Pies, MD;  Location: [facility];  Service: ENT;  Laterality: Bilateral;   NASAL SEPTOPLASTY W/ TURBINOPLASTY Bilateral 08/02/2018  Procedure: NASAL SEPTOPLASTY WITH BILATERAL TURBINATE REDUCTION;  Surgeon: Newman Pies, MD;  Location: [facility];  Service: ENT;  Laterality: Bilateral;   SINUS ENDO WITH FUSION Bilateral 08/02/2018   Procedure: SINUS ENDO  WITH FUSION;  Surgeon: Newman Pies, MD;  Location: [facility];  Service: ENT;  Laterality: Bilateral;   umbilical surgery     Family History:  Family History  Problem Relation Age of Onset   Stroke Mother    Family Psychiatric  History:  Social History:  Social History   Substance and Sexual Activity  Alcohol Use Not Currently   Alcohol/week: 7.0 standard drinks of alcohol   Types: 7 Cans of beer per week     Social History   Substance and Sexual Activity  Drug Use Never    Social History   Socioeconomic History   Marital status: Married    Spouse name: Not on file   Number of children: Not on file   Years of education: 20   Highest education level: Doctorate  Occupational History   Not on file  Tobacco Use   Smoking status: Former    Current packs/day: 0.00    Types: Cigarettes    Quit date: 1980    Years since quitting: 44.5    Passive exposure: Past   Smokeless tobacco: Never  Vaping Use   Vaping status: Never Used  Substance and Sexual Activity   Alcohol use: Not Currently    Alcohol/week: 7.0 standard drinks of alcohol    Types: 7 Cans of beer per week   Drug use: Never   Sexual activity: Not Currently  Other Topics Concern   Not on file  Social History Narrative   ** Merged History Encounter **       Lives with wife in one level R handed Caffeine - not much Exercise - not much - 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     Social Determinants of Health   Financial Resource Strain: Not on file  Food Insecurity: Patient Unable To Answer (01/31/2023)   Hunger Vital Sign    Worried About Running Out of Food in the Last Year: Patient unable to answer    Ran Out of Food in the Last Year: Patient unable to answer  Transportation Needs: Patient Unable To Answer (01/31/2023)   PRAPARE - Transportation    Lack of Transportation (Medical): Patient unable to answer    Lack of Transportation (Non-Medical): Patient  unable to answer  Physical Activity: Not on file  Stress: Not on file  Social Connections: Not on file   Additional Social History:    Allergies:   Allergies  Allergen Reactions   Tetracycline Other (See Comments)    VERTIGO   Amoxicillin-Pot Clavulanate Nausea Only   Cephalexin Other (See Comments)    vertigo   Wasp Venom Swelling    Labs: No results found for this or any previous visit (from the past 48 hour(s)).  Current Facility-Administered Medications  Medication Dose Route Frequency Provider Last Rate Last Admin   acetaminophen (TYLENOL) tablet 650 mg  650 mg Oral TID Canary Brim, NP   650 mg at 02/05/23 2108   amLODipine (NORVASC) tablet 5 mg  5 mg Oral Daily Jonah Blue, MD   5 mg at 02/05/23 1030   bisacodyl (DULCOLAX) EC tablet 5 mg  5 mg Oral Daily PRN Jonah Blue, MD       carvedilol (COREG) tablet 6.25 mg  6.25 mg Oral Daily Jonah Blue, MD   6.25 mg at 02/05/23 1029   docusate sodium (COLACE) capsule 100 mg  100 mg Oral BID Jonah Blue, MD   100 mg at 02/05/23 1030   enoxaparin (LOVENOX) injection 40 mg  40 mg Subcutaneous Q24H Kathlen Mody, MD   40 mg at 02/05/23 2113   escitalopram (LEXAPRO) tablet 5 mg  5 mg Oral Daily Jonah Blue, MD   5 mg at 02/05/23 1029   feeding supplement (ENSURE ENLIVE / ENSURE PLUS) liquid 237 mL  237 mL Oral BID BM Danford, Earl Lites, MD   237 mL at 02/05/23 1352   fluticasone (FLONASE) 50 MCG/ACT nasal spray 1 spray  1 spray Each Nare QPM Jonah Blue, MD   1 spray at 02/05/23 1724   hydrALAZINE (APRESOLINE) injection 5 mg  5 mg Intravenous Q4H PRN Jonah Blue, MD       lidocaine (LIDODERM) 5 % 2 patch  2 patch Transdermal Q24H Jonah Blue, MD   2 patch at 02/05/23 1352   lisinopril (ZESTRIL) tablet 20 mg  20 mg Oral Daily Danford, Earl Lites, MD   20 mg at 02/05/23 1029   loratadine (CLARITIN) tablet 10 mg  10 mg Oral Daily Jonah Blue, MD   10 mg at 02/05/23 1029   megestrol (MEGACE) 400  MG/10ML suspension 400 mg  400 mg Oral Daily Jonah Blue, MD   400 mg at 02/05/23 1030   ondansetron (ZOFRAN) tablet 4 mg  4 mg Oral Q6H PRN Jonah Blue, MD       Or   ondansetron [facility]) injection 4 mg  4 mg Intravenous Q6H PRN Jonah Blue, MD       oxyCODONE (Oxy IR/ROXICODONE) immediate release tablet 5 mg  5 mg Oral Q4H PRN Jonah Blue, MD   5 mg at 02/05/23 1352   pantoprazole (PROTONIX) EC tablet 40 mg  40 mg Oral Noemi Chapel, MD   40 mg at 02/05/23 2108   polyethylene glycol (MIRALAX / GLYCOLAX) packet 17 g  17 g Oral Daily PRN Jonah Blue, MD       QUEtiapine (SEROQUEL) tablet 25 mg  25 mg Oral TID Maryagnes Amos, FNP   25 mg at 02/05/23 2109   rivastigmine (EXELON) capsule 3 mg  3 mg Oral BID Jonah Blue, MD   3 mg at 02/05/23 1636   rosuvastatin (CRESTOR) tablet 40 mg  40 mg Oral Noemi Chapel, MD   40 mg at 02/05/23 2108   senna (SENOKOT) tablet 17.2 mg  2 tablet Oral BID Jonah Blue, MD   17.2 mg at 02/05/23 1030   sodium chloride flush (NS) 0.9 % injection 3 mL  3 mL Intravenous Steva Colder, MD   3 mL at 02/01/23 9629    Musculoskeletal: Resting in bed  Psychiatric Specialty Exam:  Presentation  General Appearance: Casual  Eye Contact:None  Speech:Other (comment)  Speech Volume:Other (comment)  Handedness:Right   Mood and Affect  Mood:Irritable  Affect:Other (comment)   Thought Process  Thought Processes:No data recorded Descriptions of Associations:Loose  Orientation:Partial  Thought Content:No data recorded History of Schizophrenia/Schizoaffective disorder:No data recorded Duration of Psychotic Symptoms:No data recorded Hallucinations:Hallucinations: Other (comment)  Ideas of Reference: n/A  Suicidal Thoughts: N/A Homicidal Thoughts:N/A  Sensorium  Memory:N/A Judgment: Insight:  Executive Functions  Concentration:No data recorded Attention Span:Poor  Recall:Poor  Fund of  Knowledge:Poor  Language:Fair   Psychomotor Activity  Psychomotor Activity:Psychomotor Activity: -- (resting in bed, with  bed side sittter)   Assets  Assets:Housing   Sleep  Sleep:No data recorded  Physical Exam: Physical Exam ROS Blood pressure 117/60, pulse 84, temperature 98.5 F (36.9 C), temperature source Axillary, resp. rate 16, height 5\' 10"  (1.778 m), weight 66.2 kg, SpO2 98%. Body mass index is 20.94 kg/m.  Treatment Plan Summary: Daily contact with patient to assess and evaluate symptoms and progress in treatment and Medication management  - Transitons of Care for Memory Care Unit, palliative care and or  Skilled nursing facility (SNF) placement  -Continue Seroquel 25 mg po TID - Continue delirium precautions  Anticipated discharge 7/21, to family to be transported to memory care facility in [place].  Psychiatry signing off  Disposition: No evidence of imminent risk to self or others at present.   Patient does not meet criteria for psychiatric inpatient admission. Supportive therapy provided about ongoing stressors. Refer to IOP.  Oneta Rack, NP 02/06/2023 7:59 AM

## 2023-02-05 NOTE — Progress Notes (Signed)
Physical Therapy Treatment Patient Details Name: Omar Kennedy MRN: [id number] DOB: 1946/03/21 Today's Date: 02/05/2023   History of Present Illness 77 yo male admitted 7/12 from [facility] with AMS and hematuria. PMhx:  01/04/23-7/5 admission after falling into empty pool; Sustained R scalp laceration, T11 fx, multiple TVP fxs, SAH/ SDH, R chest wall hematoma, R clavicle fx (NWB, sling), L scapula fx (WBAT), R rib fxs 5-6. dementia, CAD x/p CABG, HTN, HLD, OSAP    PT Comments  Pt greeted resting in bed and agreeable to session with encouragement. Pt continues to be limited by impaired cognition benefiting from short simple cues. Pt able to come to sitting EOB with min A to elevate trunk. Pt needing mod A to come to stand and for short ambulation in room with HHA to steady. Pt needing max multimodal cues to remain on task and maintain all WB and back precautions throughout session. Current plan remains appropriate to address deficits and maximize functional independence and decrease caregiver burden. Pt continues to benefit from skilled PT services to progress toward functional mobility goals.      Assistance Recommended at Discharge Frequent or constant Supervision/Assistance  If plan is discharge home, recommend the following:  Can travel by private vehicle    A little help with walking and/or transfers;A lot of help with bathing/dressing/bathroom;Assistance with cooking/housework;Direct supervision/assist for medications management;Assist for transportation;Assistance with feeding;Direct supervision/assist for financial management;Help with stairs or ramp for entrance   Yes  Equipment Recommendations  None recommended by PT    Recommendations for Other Services       Precautions / Restrictions Precautions Precautions: Back;Fall Precaution Booklet Issued: No Precaution Comments: pt has sling in room and had spinal brace last admission without spinal brace present currently and pt  unable to recall, retain or follow cues for precautions, Required Braces or Orthoses: Sling;Spinal Brace Spinal Brace: Lumbar corset;Applied in sitting position Restrictions Weight Bearing Restrictions: Yes RUE Weight Bearing: Non weight bearing     Mobility  Bed Mobility Overal bed mobility: Needs Assistance Bed Mobility: Supine to Sit     Supine to sit: Min assist     General bed mobility comments: min A to raise trunk, pt reaching out for this PTAs assist    Transfers Overall transfer level: Needs assistance Equipment used: 1 person hand held assist Transfers: Sit to/from Stand Sit to Stand: Mod assist           General transfer comment: mod A to come to stand, initial posterior lean with improvement on second stand    Ambulation/Gait Ambulation/Gait assistance: Mod assist Gait Distance (Feet): 35 Feet Assistive device: 1 person hand held assist Gait Pattern/deviations: Step-through pattern, Decreased stride length, Narrow base of support Gait velocity: decr     General Gait Details: multimodal cues for direction and progression, mod A to maintain balance   Stairs             Wheelchair Mobility     Tilt Bed    Modified Rankin (Stroke Patients Only)       Balance   Sitting-balance support: Single extremity supported Sitting balance-Leahy Scale: Poor Sitting balance - Comments: sitting EOB   Standing balance support: Single extremity supported Standing balance-Leahy Scale: Poor                              Cognition Arousal/Alertness: Awake/alert Behavior During Therapy: Flat affect Overall Cognitive Status: History of cognitive impairments -  at baseline                                 General Comments: advanced dementia, unable to follow commands.        Exercises      General Comments General comments (skin integrity, edema, etc.): VSS on RA, sitter present throughotu      Pertinent Vitals/Pain  Pain Assessment Pain Assessment: No/denies pain    Home Living                          Prior Function            PT Goals (current goals can now be found in the care plan section) Acute Rehab PT Goals PT Goal Formulation: Patient unable to participate in goal setting Time For Goal Achievement: 02/14/23 Progress towards PT goals: Progressing toward goals    Frequency    Min 1X/week      PT Plan Current plan remains appropriate    Co-evaluation              AM-PAC PT "6 Clicks" Mobility   Outcome Measure  Help needed turning from your back to your side while in a flat bed without using bedrails?: A Little Help needed moving from lying on your back to sitting on the side of a flat bed without using bedrails?: A Little Help needed moving to and from a bed to a chair (including a wheelchair)?: A Little Help needed standing up from a chair using your arms (e.g., wheelchair or bedside chair)?: A Little Help needed to walk in hospital room?: A Little Help needed climbing 3-5 steps with a railing? : Total 6 Click Score: 16    End of Session Equipment Utilized During Treatment: Gait belt Activity Tolerance: Patient tolerated treatment well Patient left: in bed;with call bell/phone within reach;with nursing/sitter in room (seated EOB) Nurse Communication: Mobility status PT Visit Diagnosis: History of falling (Z91.81);Difficulty in walking, not elsewhere classified (R26.2);Other abnormalities of gait and mobility (R26.89)     Time: 1610-9604 PT Time Calculation (min) (ACUTE ONLY): 15 min  Charges:    $Therapeutic Activity: 8-22 mins PT General Charges $$ ACUTE PT VISIT: 1 Visit                     [name] R. PTA Acute Rehabilitation Services Office: [phone]   Catalina Antigua 02/05/2023, 2:14 PM

## 2023-02-06 DIAGNOSIS — G9341 Metabolic encephalopathy: Secondary | ICD-10-CM | POA: Diagnosis not present

## 2023-02-06 DIAGNOSIS — I10 Essential (primary) hypertension: Secondary | ICD-10-CM | POA: Diagnosis not present

## 2023-02-06 DIAGNOSIS — I2510 Atherosclerotic heart disease of native coronary artery without angina pectoris: Secondary | ICD-10-CM | POA: Diagnosis not present

## 2023-02-06 DIAGNOSIS — F03918 Unspecified dementia, unspecified severity, with other behavioral disturbance: Secondary | ICD-10-CM | POA: Diagnosis not present

## 2023-02-06 MED ORDER — QUETIAPINE FUMARATE 25 MG PO TABS: 25.0000 mg | ORAL_TABLET | Freq: Three times a day (TID) | ORAL | 0 refills | Status: AC

## 2023-02-06 MED ORDER — HALOPERIDOL 5 MG PO TABS: 5.0000 mg | ORAL_TABLET | Freq: Three times a day (TID) | ORAL | 0 refills | Status: AC | PRN

## 2023-02-06 MED ORDER — ENSURE ENLIVE PO LIQD: 237.0000 mL | Freq: Two times a day (BID) | ORAL | Status: AC

## 2023-02-06 MED ORDER — OXYCODONE HCL 5 MG PO TABS: 5.0000 mg | ORAL_TABLET | ORAL | 0 refills | Status: AC | PRN

## 2023-02-06 NOTE — Progress Notes (Signed)
Progress Note   Patient: Omar Kennedy ZOX:[id number] DOB: [birth year] DOA: 01/29/2023     7 DOS: the patient was seen and examined on 02/06/2023 at 11:11AM      Brief hospital course: Mr. Dietrick is a 77 y.o. M with dementia, lived at home until 1 month ago, fell into his pool, admitted to Trauma (see below) also CAD s/p CABG, HTN, and OSA on CPAP who presented with agitated behaviors from SNF.      Assessment and Plan: * Acute metabolic encephalopathy At baseline prior to his fall, patient diagnosed with advanced dementia (SLUMS 18/30 in 2021, MMSE 13/30 in 01/2022) but lived at home, was conversant, recognized family, and participated in self cares.  Significantly worse since fall/multitrauma in June but was improving and cooperative when discharged from Trauma stay. After few days in SNF rehab, he became agitated, difficult to redirect, sent back to the hospital for evaluation.  On evaluation here, CT head showed resolution of findings from June, and Neurosurgery felt his imaging had reverted to his previous findings of atrophy, and that nothing was clinically apparent or concerning in imaging.  No WBC or fever.  No localizing signs, although his expressive and physical repertroire is limited.  CXR clear, CT abdomen and pelvis without new focal finding.    There was a report of hematuria observed prior to admission, but UA without blood.    Here, psychiatry were consulted, and they gave no particular recommendations.  The hospitalist service titrated up his Seroquel (he was table on this prior to admission QHS) and his symptoms stabilized. - Continue Seroquel TID - Discharge to memory care with Palliative care or Hospice following    Right clavicle fracture See my note from 7/17.  Unable to cooperate with sling.  Dementia with behavioral disturbance (HCC) - Continue Exelon - Continue Seroquel now TID - Continue Lexapro - Stop Megace (started by Trauma service) given  association with increased risk of death and superiority of atypical antipsychotics for weight gain  Fall from height of greater than 3 feet Fell into pool 1 month ago.  Found next morning and admitted as level 1 trauma.    Found ultimately to have hemothorax, small pneumothorax not requiring chest tube, multiple right rib fractures, right clavicle fracture, multiple thoracic and lumbar spine transverse process fractures, and subdural hematomas   Since the fall, as above his mentation has been considerably worse - Consult palliative care  Mixed hyperlipidemia - Continue Crestor  OSA (obstructive sleep apnea) - Defer CPAP given agitation  Essential hypertension BP normal   - Continue amlodipine, Coreg, lisinopril  - PRN hydralazine  Coronary artery disease involving native coronary artery of native heart without angina pectoris - Okay to continue Crestor, Coreg - Hold aspirin           Subjective: No nursing concerns.  Has been relatively cooperative and without focal complaints.  Afebrile.  No significant agitation and no requirement for Haldol or other agitation limiting agents.     Physical Exam: BP (!) 109/52 (BP Location: Left Arm)   Pulse 84   Temp 98.7 F (37.1 C)   Resp 15   Ht 5\' 10"  (1.778 m)   Wt 66.2 kg   SpO2 98%   BMI 20.94 kg/m   Frail appearing elderly adult male, lying in bed sleeping, opens eyes to touch, makes eye contact, responds softly then goes back to sleep RRR, no murmurs, no peripheral edema Respiratory rate normal, lungs clear without rales or  wheezes No grimace to palpation of the abdomen soft He has stable deformity of the right shoulder and right clavicle Upper extremity strength seems generally weak but symmetric, face seems symmetric, speech is slightly slurred but because he is tired, overall somnolent and does not cooperate further with exam     Data Reviewed: No new labs  Family Communication: Wife by  phone    Disposition: Status is: Inpatient The patient was admitted with increased agitation.  Antipsychotics were titrated up and his behaviors have improved and stabilized.  I suspect at this point he may be safe to discharge to a memory care unit.  However his degree of dementia is fairly advanced I think he is high fall risk, and high risk for further deterioration.  The question of transferred via ambulance has been raised, and I am uncertain that he can complete a 15 hour ambulance ride without agitation, despite that he has been stable here now for several days.  There are many uncertainties and risks associated with transport that distance which cannot be reasonably mitigated, and it has been my recommendation from the beginning that family seek placement locally.        Author: Alberteen Sam, MD 02/06/2023 1:42 PM  For on call review [email or web address].

## 2023-02-06 NOTE — Plan of Care (Signed)

## 2023-02-07 DIAGNOSIS — G9341 Metabolic encephalopathy: Secondary | ICD-10-CM | POA: Diagnosis not present

## 2023-02-07 MED ORDER — POLYETHYLENE GLYCOL 3350 17 G PO PACK: 17.0000 g | PACK | Freq: Every day | ORAL | 0 refills | Status: AC | PRN

## 2023-02-07 NOTE — TOC Transition Note (Signed)
Transition of Care [facility]) - CM/SW Discharge Note   Patient Details  Name: OCTAVIUS SHIN MRN: [id number] Date of Birth: Jan 01, 1946  Transition of Care [facility]) CM/SW Contact:  Deatra Robinson, [place] Phone Number: 02/07/2023, 11:17 AM   Clinical Narrative:  pt for dc to  [facility] [place], [place] today. Pt's wife aware of dc and reports agreeable.   Spoke to [facility] with [facility] [phone] who confirmed they are prepared to admit pt. DC summary and H&P faxed to [facility] at [facility] request [phone].   Pt's family has arranged transport via [facility] and they plan to arrive at 5pm to transport.   RN to call report to Eber Jones [phone]. SW signing off at dc.   Dellie Burns, MSW, LCSW [phone] (coverage)      Final next level of care: Skilled Nursing Facility Barriers to Discharge: No Barriers Identified   Patient Goals and CMS Choice   Choice offered to / list presented to : Adult Children  Discharge Placement                Patient chooses bed at: Other - please specify in the comment section below: [facility] [place], [place].) Patient to be transferred to facility by: [facility] service (family arranged) Name of family member notified: [name]/wife Patient and family notified of of transfer: 02/07/23  Discharge Plan and Services Additional resources added to the After Visit Summary for                                       Social Determinants of Health (SDOH) Interventions SDOH Screenings   Food Insecurity: Patient Unable To Answer (01/31/2023)  Housing: High Risk (01/31/2023)  Transportation Needs: Patient Unable To Answer (01/31/2023)  Utilities: Patient Unable To Answer (01/31/2023)  Tobacco Use: Medium Risk (01/31/2023)     Readmission Risk Interventions     No data to display

## 2023-02-07 NOTE — Discharge Summary (Addendum)
Physician Discharge Summary  CORDE ANTONINI AOZ:[id number] DOB: Apr 09, 1946 DOA: 01/29/2023  PCP: [facility]  Admit date: 01/29/2023 Discharge date: 02/07/2023  Admitted From: SNF Disposition:  With Family - plan to take patient to [place] to be near family at local facilty.  Recommendations for Outpatient Follow-up:  Follow up with PCP at facility in 1-2 weeks for routine labs/evaluation  Discharge Condition:Stable  CODE STATUS:DNR  Diet recommendation: As tolerated   Brief/Interim Summary: Mr. Obryant is a 77 y.o. M with dementia, lived at home until 1 month ago, fell into his pool, admitted to Trauma (see below) also CAD s/p CABG, HTN, and OSA on CPAP who presented with agitated behaviors from SNF.   Improved here on new medication regimen - family to pick up patient today to transition him to a facility in [place] that is local to family there. He is otherwise stable for discharge.  Discharge Diagnoses:  Principal Problem:   Acute metabolic encephalopathy Active Problems:   Coronary artery disease involving native coronary artery of native heart without angina pectoris   Essential hypertension   OSA (obstructive sleep apnea)   Mixed hyperlipidemia   Fall from height of greater than 3 feet   Dementia with behavioral disturbance (HCC)   Right clavicle fracture  Acute metabolic encephalopathy At baseline prior to his fall, patient diagnosed with advanced dementia (SLUMS 18/30 in 2021, MMSE 13/30 in 01/2022) but lived at home, was conversant, recognized family, and participated in self care/ADLs.  Significantly worse since fall/multi-trauma event in June but was improving somewhat towards baseline; pleasant and cooperative when discharged from Trauma stay earlier this month. After few days in SNF rehab, he became agitated again - was difficult to redirect and ultimately sent back to the hospital for evaluation.   On evaluation here, CT head showed resolution of  findings from June, and Neurosurgery felt his imaging had reverted to his previous findings of atrophy, and that nothing was clinically apparent or concerning in imaging. No WBC or fever.  No localizing signs, although his expressive and physical repertroire is limited.  CXR clear, CT abdomen and pelvis without new focal finding. There was a report of hematuria observed prior to admission at facility, but UA without blood - no further episodes during hospitalization.     Here, psychiatry were consulted, and they gave no particular recommendations. Hospitalist service titrated up his Seroquel (he was table on this prior to admission QHS) and his symptoms stabilized. - Continue Seroquel TID - Discharge to family for memory care placement with Palliative care or Hospice follow up as discussed.    Right clavicle fracture See note from 7/17.  Unable to cooperate with sling. Continue to encourage compliance (hoping family will have more luck redirecting him).   Dementia with behavioral disturbance (HCC) - Continue Exelon - Continue Seroquel now TID - Continue Lexapro - Stop Megace (started by Trauma service) given association with increased risk of death and superiority of atypical antipsychotics for weight gain   Fall from height of greater than 3 feet Fell into empty pool 1 month ago.  Found next morning and admitted as level 1 trauma. Found ultimately to have hemothorax, small pneumothorax not requiring chest tube, multiple right rib fractures, right clavicle fracture, multiple thoracic and lumbar spine transverse process fractures, and subdural hematomas. Since the fall, as above his mentation has been considerably worse. Follow up with palliative care as discussed.   Mixed hyperlipidemia - Continue Crestor   OSA (obstructive sleep apnea) -  Defer CPAP given agitation; would recommend usage at facility if tolerated.   Essential hypertension BP normal   - Continue amlodipine, Coreg, lisinopril     Coronary artery disease involving native coronary artery of native heart without angina pectoris - Okay to continue Crestor, Coreg - DC aspirin given bleed risk with increased falls.   Discharge Instructions   Allergies as of 02/07/2023       Reactions   Tetracycline Other (See Comments)   VERTIGO   Amoxicillin-pot Clavulanate Nausea Only   Cephalexin Other (See Comments)   vertigo   Wasp Venom Swelling        Medication List     STOP taking these medications    ALPRAZolam 0.25 MG tablet Commonly known as: XANAX   ascorbic acid 500 MG tablet Commonly known as: VITAMIN C   aspirin EC 81 MG tablet   calcium carbonate 1250 (500 Ca) MG chewable tablet Commonly known as: OS-CAL   calcium-vitamin D 500-200 MG-UNIT tablet Commonly known as: OSCAL WITH D   cefpodoxime 200 MG tablet Commonly known as: VANTIN   cholecalciferol 25 MCG (1000 UNIT) tablet Commonly known as: VITAMIN D3   HAIR SKIN AND NAILS FORMULA PO   meclizine 12.5 MG tablet Commonly known as: ANTIVERT   megestrol 400 MG/10ML suspension Commonly known as: MEGACE   Melatonin 10 MG Tabs   mirtazapine 15 MG tablet Commonly known as: REMERON   multivitamin with minerals Tabs tablet   sildenafil 100 MG tablet Commonly known as: VIAGRA       TAKE these medications    acetaminophen 500 MG tablet Commonly known as: TYLENOL Take 1,000 mg by mouth every 6 (six) hours as needed for headache. What changed: Another medication with the same name was removed. Continue taking this medication, and follow the directions you see here.   amLODipine 5 MG tablet Commonly known as: NORVASC Take 5 mg by mouth daily.   carvedilol 6.25 MG tablet Commonly known as: COREG Take 6.25 mg by mouth daily.   cetirizine 10 MG tablet Commonly known as: ZYRTEC Take 10 mg by mouth daily.   cyanocobalamin 1000 MCG tablet Commonly known as: VITAMIN B12 Take 1,000 mcg by mouth daily.   vitamin B-12 100 MCG  tablet Commonly known as: CYANOCOBALAMIN Take 100 mcg by mouth daily. gummies   docusate sodium 100 MG capsule Commonly known as: COLACE Take 1 capsule (100 mg total) by mouth 2 (two) times daily as needed for mild constipation.   escitalopram 5 MG tablet Commonly known as: LEXAPRO Take 5 mg by mouth daily.   feeding supplement Liqd Take 237 mLs by mouth 2 (two) times daily between meals.   ferrous sulfate 325 (65 FE) MG EC tablet Take 325 mg by mouth daily with breakfast.   fluticasone 50 MCG/ACT nasal spray Commonly known as: FLONASE Place 1 spray into both nostrils every evening.   haloperidol 5 MG tablet Commonly known as: HALDOL Take 1 tablet (5 mg total) by mouth every 8 (eight) hours as needed for agitation.   lidocaine 5 % Commonly known as: LIDODERM Place 2 patches onto the skin daily. Remove & Discard patch within 12 hours or as directed by MD   lisinopril 20 MG tablet Commonly known as: ZESTRIL Take 20 mg by mouth daily.   oxyCODONE 5 MG immediate release tablet Commonly known as: Oxy IR/ROXICODONE Take 1 tablet (5 mg total) by mouth every 4 (four) hours as needed for moderate pain.   pantoprazole 40 MG  tablet Commonly known as: PROTONIX Take 40 mg by mouth at bedtime.   polyethylene glycol 17 g packet Commonly known as: MIRALAX / GLYCOLAX Take 17 g by mouth daily as needed for mild constipation. What changed:  how to take this when to take this reasons to take this   QUEtiapine 25 MG tablet Commonly known as: SEROQUEL Take 1 tablet (25 mg total) by mouth 3 (three) times daily. What changed: when to take this   rivastigmine 3 MG capsule Commonly known as: EXELON Take 3 mg by mouth 2 (two) times daily.   rosuvastatin 40 MG tablet Commonly known as: CRESTOR Take 40 mg by mouth at bedtime.   senna 8.6 MG Tabs tablet Commonly known as: SENOKOT Take 2 tablets (17.2 mg total) by mouth at bedtime. What changed: when to take this         Allergies  Allergen Reactions   Tetracycline Other (See Comments)    VERTIGO   Amoxicillin-Pot Clavulanate Nausea Only   Cephalexin Other (See Comments)    vertigo   Wasp Venom Swelling    Consultations: Palliative, Psych   Procedures/Studies: DG Clavicle Right  Result Date: 02/03/2023 CLINICAL DATA:  Fracture the right medial clavicle and right-sided ribs EXAM: RIGHT CLAVICLE - 2+ VIEWS COMPARISON:  Chest CT 01/08/2023 and chest radiograph from 01/29/2023 FINDINGS: The medial right clavicular fracture is only faintly seen on today's exam, not unexpected as this portion of the clavicle is difficult to assess radiographically, and is better assessed by CT. The long axis of the shaft of the clavicle is below the expected location of the sternoclavicular joint implying that the dominant mid and distal clavicular fragment is inferiorly displaced with respect to the clavicular head/medial clavicular fragment. This matches the appearance on image 14 of series 6 of the CT exam of 01/08/2023. No fracture of the mid or distal clavicle is observed. AC joint alignment on the right is normal. Displaced right posterior fifth, sixth, seventh, and eighth rib fractures noted. Prior CABG.  Atherosclerotic calcification of the aortic arch. IMPRESSION: 1. Displaced medial clavicular fracture, with indistinct medial clavicular head fragment. Medial clavicular fractures are typically more visible and better characterized by CT. 2. Displaced right posterior fifth, sixth, seventh, and eighth rib fractures. Electronically Signed   By: Gaylyn Rong M.D.   On: 02/03/2023 15:28   CT ABDOMEN PELVIS W CONTRAST  Result Date: 01/30/2023 CLINICAL DATA:  Abdominal/flank pain.  Stone suspected. EXAM: CT ABDOMEN AND PELVIS WITH CONTRAST TECHNIQUE: Multidetector CT imaging of the abdomen and pelvis was performed using the standard protocol following bolus administration of intravenous contrast. RADIATION DOSE  REDUCTION: This exam was performed according to the departmental dose-optimization program which includes automated exposure control, adjustment of the mA and/or kV according to patient size and/or use of iterative reconstruction technique. CONTRAST:  75mL OMNIPAQUE IOHEXOL 350 MG/ML SOLN COMPARISON:  Recent prior CT with contrast 01/04/2023, remote prior CT with contrast 12/29/2019. FINDINGS: Lower chest: There are multilevel posterior right ribcage fractures which were noted previously. Most of the fractures demonstrate an early interval healing reaction at the fracture margins, but there is a displaced fracture with overriding of the posterolateral right eighth rib which is entirely unchanged in appearance, additional displaced fracture of the posterior right eleventh rib which is also unchanged in appearance. There is a small layering right pleural effusion which is smaller than previously. Small amount of adjacent atelectasis in the posterior basal right lower lobe. The remaining lung bases are clear. Sternotomy  and old CABG changes are again noted with multivessel calcific CAD. The cardiac size is normal. Hepatobiliary: No mass enhancement. Periligamentous fat deposition is again noted in the left lobe. There is a tiny hypodensity in segment 4A which is unchanged. No other focal liver abnormality is seen. There is mild dilatation of the gallbladder without calcified stones, wall thickening or biliary dilatation. Pancreas: No abnormality. Spleen: Enlarged, measuring 19 x 13.3 x 8.6 cm, slightly larger than previously. No focal mass. Adrenals/Urinary Tract: Adrenal glands are unremarkable. Kidneys are normal, without renal calculi, focal lesion, or hydronephrosis. Bladder is unremarkable. Stomach/Bowel: No dilatation or wall thickening. An appendix is not seen. Vascular/Lymphatic: Aortic atherosclerosis. No enlarged abdominal or pelvic lymph nodes. Reproductive: Mild prostatomegaly. Other: There previously was a  small right-sided hemoperitoneum which has resolved in the interval. There is no free fluid, free hemorrhage or free air at this time. There is no incarcerated hernia. There are no acute inflammatory changes. Musculoskeletal: Since the prior study there is new demonstration of mild upper plate anterior wedge compression fractures of the T10, T12, L2 and L4 vertebral bodies with subacute appearance. Unclear if this is due to interval trauma or whether the fractures may have been present previously but at that time radiographically occult. In any case, the appearance of linear sclerosis in the affected vertebral bodies and the lack of paraspinal edema both suggest a subacute age. Fractures of the right L1-4 transverse processes are again noted with no evidence of visible interval healing. No pelvic or proximal femoral fractures are seen. IMPRESSION: 1. No acute soft tissue findings in the abdomen or pelvis. 2. Small right pleural effusion, smaller than previously. 3. Multiple posterior right ribcage fractures which were seen previously, most of which show an early interval healing reaction at the fracture margins. The more displaced fractures of the eighth and eleventh ribs do not show visible interval healing. 4. Fractures of the right L1-4 transverse processes are again noted with no visible interval healing. 5. Mild upper plate anterior wedge compression fractures of the T10, T12, L2 and L4 vertebral bodies, new since the prior study. Unclear if this is due to interval trauma or whether the fractures may have been present previously but at that time radiographically occult. In any case, the appearance of linear sclerosis in the affected vertebral bodies and the lack of paraspinal edema both suggest a subacute age. 6. Splenomegaly, slightly larger than previously. 7. Aortic and coronary artery atherosclerosis. 8. Mildly dilated gallbladder without calcified stones, wall thickening or biliary dilatation. 9. Mild  prostatomegaly. Aortic Atherosclerosis (ICD10-I70.0). Electronically Signed   By: Almira Bar M.D.   On: 01/30/2023 01:22   CT Head Wo Contrast  Result Date: 01/30/2023 CLINICAL DATA:  Mental status change EXAM: CT HEAD WITHOUT CONTRAST TECHNIQUE: Contiguous axial images were obtained from the base of the skull through the vertex without intravenous contrast. RADIATION DOSE REDUCTION: This exam was performed according to the departmental dose-optimization program which includes automated exposure control, adjustment of the mA and/or kV according to patient size and/or use of iterative reconstruction technique. COMPARISON:  MRI head 01/08/2023 and CT head 01/05/2023 FINDINGS: Brain: No intracranial hemorrhage, mass effect, or evidence of acute infarct. No extra-axial fluid collection. Generalized cerebral atrophy. Ill-defined hypoattenuation within the cerebral white matter is nonspecific but consistent with chronic small vessel ischemic disease. Chronic right basal ganglia infarct. Increased ventricular caliber of the lateral ventricles compared with 01/08/2023. The degree of ventricular dilation appears out of proportion compared to the degree  of cerebral atrophy. Vascular: No hyperdense vessel. Intracranial arterial calcification. Skull: No fracture or focal lesion. Sinuses/Orbits: No acute finding. Paranasal sinuses and mastoid air cells are well aerated. Other: None. IMPRESSION: 1. Increased ventricular caliber of the lateral ventricles compared with 01/08/2023. The degree of ventricular dilation appears out of proportion compared to the degree of cerebral atrophy. 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 Electronically Signed   By: Minerva Fester M.D.   On: 01/30/2023 00:58   DG Chest 2 View  Result Date: 01/29/2023 CLINICAL DATA:  Altered mental status EXAM: CHEST - 2 VIEW COMPARISON:  Chest CT 10/23/2021, 12/28/2019, 01/08/2023 FINDINGS: Post  sternotomy changes. Multiple displaced right-sided rib fractures. No visible pneumothorax on this exam. Small volume right pleural effusion. Left lung clear. Stable cardiomediastinal silhouette allowing for rotation. Aortic atherosclerosis. Right medial clavicular head and right superior scapular fractures are again noted. Mild wedging of T12 similar compared to prior IMPRESSION: 1. Multiple displaced right-sided rib fractures as seen on prior exam with small volume right pleural effusion. No visible pneumothorax. 2. Right clavicular head and right superior scapular fractures again noted. Electronically Signed   By: Jasmine Pang M.D.   On: 01/29/2023 19:19   DG Abd Portable 1V  Result Date: 01/11/2023 CLINICAL DATA:  Check gastric catheter placement EXAM: PORTABLE ABDOMEN - 1 VIEW COMPARISON:  None Available. FINDINGS: Weighted feeding catheter is noted in the distal stomach. Scattered large and small bowel gas is noted. IMPRESSION: Weighted feeding catheter in the distal stomach. Electronically Signed   By: Alcide Clever M.D.   On: 01/11/2023 10:40   MR BRAIN WO CONTRAST  Result Date: 01/08/2023 CLINICAL DATA:  Delirium. EXAM: MRI HEAD WITHOUT CONTRAST TECHNIQUE: Multiplanar, multiecho pulse sequences of the brain and surrounding structures were obtained without intravenous contrast. COMPARISON:  Head CT 01/05/2023.  Brain MRI 04/02/2022. FINDINGS: Brain: Unchanged small bilateral holohemispheric subdural hematomas, measuring up to 7 mm on the left and 6 mm on the right. Unchanged scattered areas of subarachnoid hemorrhage, greatest along the left temporal convexity. Trace layering hemorrhage in the occipital horns. No evidence of acute hydrocephalus. No acute infarct. No significant mass effect or midline shift. Vascular: Normal flow voids. Skull and upper cervical spine: Normal marrow signal. Sinuses/Orbits: Unremarkable. Other: None. IMPRESSION: 1. Unchanged small bilateral holohemispheric subdural  hematomas, measuring up to 7 mm on the left and 6 mm on the right. 2. Unchanged scattered areas of subarachnoid hemorrhage, greatest along the left temporal convexity. 3. Trace layering hemorrhage in the occipital horns. No evidence of acute hydrocephalus. Electronically Signed   By: Orvan Falconer M.D.   On: 01/08/2023 15:58     Subjective: No acute issues/events overnight   Discharge Exam: Vitals:   02/07/23 0604 02/07/23 0855  BP: (!) 132/59 (!) 140/91  Pulse: 69 79  Resp: 15 17  Temp: 97.9 F (36.6 C) 98.1 F (36.7 C)  SpO2: 99% 100%   Vitals:   02/06/23 1630 02/07/23 0220 02/07/23 0604 02/07/23 0855  BP: 102/61 134/69 (!) 132/59 (!) 140/91  Pulse: 90  69 79  Resp: 17  15 17   Temp: 97.9 F (36.6 C)  97.9 F (36.6 C) 98.1 F (36.7 C)  TempSrc:    Oral  SpO2: 100% 99% 99% 100%  Weight:      Height:        General: Pt is alert, awake, not in acute distress Cardiovascular: RRR, S1/S2 +, no rubs, no gallops Respiratory: CTA bilaterally,  no wheezing, no rhonchi Abdominal: Soft, NT, ND, bowel sounds + Extremities: no edema, no cyanosis   The results of significant diagnostics from this hospitalization (including imaging, microbiology, ancillary and laboratory) are listed below for reference.     Microbiology: No results found for this or any previous visit (from the past 240 hour(s)).   Labs: BNP (last 3 results) No results for input(s): "BNP" in the last 8760 hours. Basic Metabolic Panel: Recent Labs  Lab 02/02/23 1531  NA 138  K 4.0  CL 110  CO2 21*  GLUCOSE 108*  BUN 12  CREATININE 1.06  CALCIUM 9.1   Liver Function Tests: Recent Labs  Lab 02/02/23 1531  AST 40  ALT 42  ALKPHOS 140*  BILITOT 2.0*  PROT 5.6*  ALBUMIN 3.3*   Recent Labs  Lab 02/02/23 1531  AMMONIA 42*   CBC: No results for input(s): "WBC", "NEUTROABS", "HGB", "HCT", "MCV", "PLT" in the last 168 hours.  Urinalysis    Component Value Date/Time   COLORURINE AMBER (A)  01/30/2023 1445   APPEARANCEUR CLEAR 01/30/2023 1445   LABSPEC >1.046 (H) 01/30/2023 1445   PHURINE 5.0 01/30/2023 1445   GLUCOSEU NEGATIVE 01/30/2023 1445   HGBUR SMALL (A) 01/30/2023 1445   BILIRUBINUR NEGATIVE 01/30/2023 1445   KETONESUR 5 (A) 01/30/2023 1445   PROTEINUR 30 (A) 01/30/2023 1445   UROBILINOGEN 0.2 12/02/2009 2303   NITRITE NEGATIVE 01/30/2023 1445   LEUKOCYTESUR NEGATIVE 01/30/2023 1445   Sepsis Labs No results for input(s): "WBC" in the last 168 hours.  Invalid input(s): "PROCALCITONIN", "LACTICIDVEN"  Microbiology No results found for this or any previous visit (from the past 240 hour(s)).   Time coordinating discharge: Over 30 minutes  SIGNED:   Azucena Fallen, DO [facility] 02/07/2023, 12:58 PM Pager   If 7PM-7AM, please contact night-coverage [email or web address]

## 2023-02-07 NOTE — Progress Notes (Signed)
Report called to Cl[place] at [facility]. Gave her the unit number for any questions that may arise

## 2023-02-07 NOTE — Plan of Care (Signed)

## 2023-06-20 DEATH — deceased
